# Patient Record
Sex: Male | Born: 1945 | Race: Black or African American | Hispanic: No | Marital: Married | Smoking: Never smoker
Health system: Southern US, Community
[De-identification: ages and names within clinical notes are randomized; demographics above are authoritative.]

## PROBLEM LIST (undated history)

## (undated) DIAGNOSIS — E785 Hyperlipidemia, unspecified: Secondary | ICD-10-CM

## (undated) DIAGNOSIS — J45909 Unspecified asthma, uncomplicated: Secondary | ICD-10-CM

## (undated) DIAGNOSIS — B019 Varicella without complication: Secondary | ICD-10-CM

## (undated) DIAGNOSIS — C61 Malignant neoplasm of prostate: Secondary | ICD-10-CM

## (undated) DIAGNOSIS — T7840XA Allergy, unspecified, initial encounter: Secondary | ICD-10-CM

## (undated) HISTORY — PX: KNEE ARTHROSCOPY: SUR90

## (undated) HISTORY — DX: Hyperlipidemia, unspecified: E78.5

## (undated) HISTORY — PX: WRIST SURGERY: SHX841

## (undated) HISTORY — DX: Varicella without complication: B01.9

## (undated) HISTORY — DX: Unspecified asthma, uncomplicated: J45.909

## (undated) HISTORY — DX: Allergy, unspecified, initial encounter: T78.40XA

---

## 2016-10-08 ENCOUNTER — Ambulatory Visit (INDEPENDENT_AMBULATORY_CARE_PROVIDER_SITE_OTHER): Payer: Self-pay | Admitting: Family Medicine

## 2016-10-08 VITALS — BP 140/70 | HR 63 | Temp 97.9°F | Resp 17 | Ht 72.0 in | Wt 185.0 lb

## 2016-10-08 DIAGNOSIS — I10 Essential (primary) hypertension: Secondary | ICD-10-CM

## 2016-10-08 DIAGNOSIS — J4520 Mild intermittent asthma, uncomplicated: Secondary | ICD-10-CM

## 2016-10-08 DIAGNOSIS — E559 Vitamin D deficiency, unspecified: Secondary | ICD-10-CM

## 2016-10-08 MED ORDER — ALBUTEROL SULFATE HFA 108 (90 BASE) MCG/ACT IN AERS: 2.0000 | INHALATION_SPRAY | Freq: Four times a day (QID) | RESPIRATORY_TRACT | 2 refills | Status: DC | PRN

## 2016-10-08 MED ORDER — LISINOPRIL 10 MG PO TABS: 10.0000 mg | ORAL_TABLET | Freq: Every day | ORAL | 1 refills | Status: DC

## 2016-10-08 NOTE — Progress Notes (Signed)
Chief Complaint  Patient presents with  . Medication Refill    Vit D, Lisinopril, Calcarb 600    HPI  Patient here to establish care  Hypertension: Patient here for follow-up of elevated blood pressure. He is not exercising and is adherent to low salt diet.  Blood pressure is well controlled but does not check at home. Cardiac symptoms none. Patient denies chest pain, lower extremity edema and palpitations.  Cardiovascular risk factors: advanced age (older than 53 for men, 40 for women), hypertension and male gender. Use of agents associated with hypertension: none. History of target organ damage: none.  Asthma Pt reports that he uses advair daily bid and has been compliant He does not know where his albuterol rescue inhaler is He is a nonsmoker Reports that his symptoms are mild and has not had symptoms for more than a month Typical triggers - dust and mold   Past Medical History:  Diagnosis Date  . Asthma     Current Outpatient Prescriptions  Medication Sig Dispense Refill  . Calcium Carbonate-Vitamin D (CALCARB 600/D PO) Take by mouth daily.    . Ergocalciferol (VITAMIN D2 PO) Take 50,000 Units by mouth daily.    . Fluticasone-Salmeterol (ADVAIR) 250-50 MCG/DOSE AEPB Inhale 1 puff into the lungs 2 (two) times daily.    Marland Kitchen lisinopril (PRINIVIL,ZESTRIL) 10 MG tablet Take 1 tablet (10 mg total) by mouth daily. 90 tablet 1  . albuterol (PROVENTIL HFA;VENTOLIN HFA) 108 (90 Base) MCG/ACT inhaler Inhale 2 puffs into the lungs every 6 (six) hours as needed for wheezing or shortness of breath. 1 Inhaler 2   No current facility-administered medications for this visit.     Allergies:  Allergies  Allergen Reactions  . Penicillins Other (See Comments)    Dizziness     History reviewed. No pertinent surgical history.  Social History   Social History  . Marital status: Married    Spouse name: N/A  . Number of children: N/A  . Years of education: N/A   Social History Main  Topics  . Smoking status: Never Smoker  . Smokeless tobacco: Never Used  . Alcohol use 0.6 oz/week    1 Standard drinks or equivalent per week     Comment: per week  . Drug use: No  . Sexual activity: Not Asked   Other Topics Concern  . None   Social History Narrative  . None    ROS  Objective: Vitals:   10/08/16 1137  BP: 140/70  Pulse: 63  Resp: 17  Temp: 97.9 F (36.6 C)  TempSrc: Oral  SpO2: 98%  Weight: 185 lb (83.9 kg)  Height: 6' (1.829 m)    Physical Exam  Constitutional: He is oriented to person, place, and time. He appears well-developed and well-nourished.  HENT:  Head: Normocephalic and atraumatic.  Eyes: Conjunctivae and EOM are normal.  Cardiovascular: Normal rate, regular rhythm and normal heart sounds.   No murmur heard. Pulmonary/Chest: Effort normal and breath sounds normal. No respiratory distress. He has no wheezes.  Neurological: He is alert and oriented to person, place, and time. He has normal reflexes.  Skin: Skin is warm. Capillary refill takes less than 2 seconds.  Psychiatric: He has a normal mood and affect. His behavior is normal. Judgment and thought content normal.    Assessment and Plan Tomer was seen today for medication refill.  Diagnoses and all orders for this visit:  Vitamin D deficiency-  Pt currently taking 50,000 units of Vit D 2  daily Advised to check levels today  If his levels are greater than 20 then he can decrease to weekly -     Vitamin D, 25-hydroxy  Essential hypertension- chronic, well controlled Will assess renal function -     Basic metabolic panel  Mild intermittent asthma, uncomplicated- well controlled on Advair Refilled albuterol to take prn  Other orders -     lisinopril (PRINIVIL,ZESTRIL) 10 MG tablet; Take 1 tablet (10 mg total) by mouth daily. -     albuterol (PROVENTIL HFA;VENTOLIN HFA) 108 (90 Base) MCG/ACT inhaler; Inhale 2 puffs into the lungs every 6 (six) hours as needed for wheezing or  shortness of breath.     [place]

## 2016-10-08 NOTE — Patient Instructions (Addendum)
Please call your insurance to see if you have already had your Welcome to Medicare exam. This is an exam that is done in the first 12 months of being on Medicare. If you have been on Medicare for longer than that then you just need your Medicare Annual Wellness Exam.    IF you received an x-ray today, you will receive an invoice from [facility]. Please contact [facility] at [phone] with questions or concerns regarding your invoice.   IF you received labwork today, you will receive an invoice from [employer]. Please contact [facility] at [phone] with questions or concerns regarding your invoice.   Our billing staff will not be able to assist you with questions regarding bills from these companies.  You will be contacted with the lab results as soon as they are available. The fastest way to get your results is to activate your My Chart account. Instructions are located on the last page of this paperwork. If you have not heard from [place] regarding the results in 2 weeks, please contact this office.

## 2016-10-09 ENCOUNTER — Other Ambulatory Visit: Payer: Self-pay | Admitting: Family Medicine

## 2016-10-09 LAB — BASIC METABOLIC PANEL
BUN: 13 mg/dL (ref 7–25)
CALCIUM: 10.1 mg/dL (ref 8.6–10.3)
CO2: 26 mmol/L (ref 20–31)
CREATININE: 1.1 mg/dL (ref 0.70–1.18)
Chloride: 102 mmol/L (ref 98–110)
GLUCOSE: 82 mg/dL (ref 65–99)
Potassium: 5.2 mmol/L (ref 3.5–5.3)
Sodium: 138 mmol/L (ref 135–146)

## 2016-10-09 LAB — VITAMIN D 25 HYDROXY (VIT D DEFICIENCY, FRACTURES): VIT D 25 HYDROXY: 22 ng/mL — AB (ref 30–100)

## 2016-10-09 MED ORDER — VITAMIN D (ERGOCALCIFEROL) 1.25 MG (50000 UNIT) PO CAPS: 50000.0000 [IU] | ORAL_CAPSULE | ORAL | 1 refills | Status: DC

## 2016-12-05 ENCOUNTER — Telehealth: Payer: Self-pay

## 2016-12-05 ENCOUNTER — Other Ambulatory Visit: Payer: Self-pay | Admitting: Emergency Medicine

## 2016-12-05 ENCOUNTER — Telehealth: Payer: Self-pay | Admitting: Emergency Medicine

## 2016-12-05 MED ORDER — FLUTICASONE-SALMETEROL 250-50 MCG/DOSE IN AEPB: 1.0000 | INHALATION_SPRAY | Freq: Two times a day (BID) | RESPIRATORY_TRACT | 2 refills | Status: DC

## 2016-12-05 NOTE — Telephone Encounter (Signed)
Pt is requesting medication refill Advair Reordered and e-scribed

## 2016-12-05 NOTE — Telephone Encounter (Signed)
Pt is requesting a refill on asthma medication  Please call into [facility] on [address]   Best number [phone]

## 2016-12-20 DIAGNOSIS — H2513 Age-related nuclear cataract, bilateral: Secondary | ICD-10-CM | POA: Diagnosis not present

## 2017-02-10 ENCOUNTER — Ambulatory Visit (INDEPENDENT_AMBULATORY_CARE_PROVIDER_SITE_OTHER): Payer: Medicare HMO | Admitting: Family Medicine

## 2017-02-10 ENCOUNTER — Encounter: Payer: Self-pay | Admitting: Family Medicine

## 2017-02-10 VITALS — BP 145/70 | HR 87 | Resp 12 | Ht 72.0 in | Wt 195.5 lb

## 2017-02-10 DIAGNOSIS — I10 Essential (primary) hypertension: Secondary | ICD-10-CM | POA: Diagnosis not present

## 2017-02-10 DIAGNOSIS — J4520 Mild intermittent asthma, uncomplicated: Secondary | ICD-10-CM

## 2017-02-10 DIAGNOSIS — E559 Vitamin D deficiency, unspecified: Secondary | ICD-10-CM | POA: Insufficient documentation

## 2017-02-10 DIAGNOSIS — R351 Nocturia: Secondary | ICD-10-CM

## 2017-02-10 DIAGNOSIS — E785 Hyperlipidemia, unspecified: Secondary | ICD-10-CM | POA: Diagnosis not present

## 2017-02-10 DIAGNOSIS — N401 Enlarged prostate with lower urinary tract symptoms: Secondary | ICD-10-CM

## 2017-02-10 LAB — URINALYSIS, ROUTINE W REFLEX MICROSCOPIC
Bilirubin Urine: NEGATIVE
Hgb urine dipstick: NEGATIVE
Ketones, ur: NEGATIVE
Leukocytes, UA: NEGATIVE
Nitrite: NEGATIVE
RBC / HPF: NONE SEEN (ref 0–?)
SPECIFIC GRAVITY, URINE: 1.02 (ref 1.000–1.030)
TOTAL PROTEIN, URINE-UPE24: NEGATIVE
URINE GLUCOSE: NEGATIVE
Urobilinogen, UA: 0.2 (ref 0.0–1.0)
pH: 8 (ref 5.0–8.0)

## 2017-02-10 LAB — BASIC METABOLIC PANEL
BUN: 12 mg/dL (ref 6–23)
CHLORIDE: 104 meq/L (ref 96–112)
CO2: 29 mEq/L (ref 19–32)
CREATININE: 1.21 mg/dL (ref 0.40–1.50)
Calcium: 10.4 mg/dL (ref 8.4–10.5)
GFR: 75.96 mL/min (ref >60.00)
GLUCOSE: 83 mg/dL (ref 70–99)
POTASSIUM: 4.2 meq/L (ref 3.5–5.1)
Sodium: 141 mEq/L (ref 135–145)

## 2017-02-10 LAB — VITAMIN D 25 HYDROXY (VIT D DEFICIENCY, FRACTURES): VITD: 36.06 ng/mL (ref 30.00–100.00)

## 2017-02-10 LAB — PSA: PSA: 6.34 ng/mL — AB (ref 0.10–4.00)

## 2017-02-10 MED ORDER — DOXAZOSIN MESYLATE 2 MG PO TABS: 2.0000 mg | ORAL_TABLET | Freq: Every day | ORAL | 3 refills | Status: DC

## 2017-02-10 MED ORDER — VITAMIN D (ERGOCALCIFEROL) 1.25 MG (50000 UNIT) PO CAPS: 50000.0000 [IU] | ORAL_CAPSULE | ORAL | 2 refills | Status: DC

## 2017-02-10 MED ORDER — LISINOPRIL 10 MG PO TABS: 10.0000 mg | ORAL_TABLET | Freq: Every day | ORAL | 1 refills | Status: DC

## 2017-02-10 MED ORDER — FLUTICASONE-SALMETEROL 250-50 MCG/DOSE IN AEPB: 1.0000 | INHALATION_SPRAY | Freq: Two times a day (BID) | RESPIRATORY_TRACT | 2 refills | Status: DC

## 2017-02-10 MED FILL — LISINOPRIL 10 MG TABLET: 10 | 90 days supply | Qty: 90 | Fill #0

## 2017-02-10 NOTE — Patient Instructions (Signed)
A few things to remember from today's visit:   Essential hypertension - Plan: Basic metabolic panel, doxazosin (CARDURA) 2 MG tablet  Vitamin D deficiency - Plan: VITAMIN D 25 Hydroxy (Vit-D Deficiency, Fractures)  BPH associated with nocturia - Plan: doxazosin (CARDURA) 2 MG tablet, PSA, Urinalysis, Routine w reflex microscopic  Blood pressure goal for most people is less than 140/90. Some populations (older than 60) the goal is less than 150/90.  Most recent cardiologists' recommendations recommend blood pressure at or less than 130/80.   Elevated blood pressure increases the risk of strokes, heart and kidney disease, and eye problems. Regular physical activity and a healthy diet (DASH diet) usually help. Low salt diet. Potassium rich diet.  Take medications as instructed.Today Cardura added for prostate and blood pressure. No changes in Lisinopril.  Caution with some over the counter medications as cold medications, dietary products (for weight loss), and Ibuprofen or Aleve (frequent use);all these medications could cause elevation of blood pressure.   Please be sure medication list is accurate. If a new problem present, please set up appointment sooner than planned today.

## 2017-02-10 NOTE — Progress Notes (Signed)
Pre visit review using our clinic review tool, if applicable. No additional management support is needed unless otherwise documented below in the visit note. 

## 2017-02-10 NOTE — Progress Notes (Signed)
HPI:   [name] is a 71 y.o. male, who is here today with his wife to establish care with me.  Former PCP: Dr Willie Parks  Last preventive routine visit: not sure.  Chronic medical problems: HTN,HLD, asthma, and vit D deficiency.  Vit D deficiency: He is on Ergocalciferol 50,000 U weekly.  History of asthma, currently he is on Advair 250-50 mcg, which he uses once daily. He also has albuterol inhaler, which he has not needed it in a while. He denies any nocturnal symptom. Currently he is asymptomatic. Denies Hx of tobacco use, wife is a smoker.  He has not had pneumonia or influenza vaccine, has refused.  Hyperlipidemia:  Currently on non pharmacologic treatment. Following a low fat diet: No.  According to wife, it was checked about 6-7 months ago and was "bad" but he was not started on medication, recommended follow up after dietary changes.  Hypertension:   Dx about a year ago. Currently on Lisinopril 10 mg daily.   He is taking medications as instructed, no side effects reported.  He has not noted unusual headache, visual changes, exertional chest pain, dyspnea,  focal weakness, or edema.   Lab Results  Component Value Date   CREATININE 1.10 10/08/2016   BUN 13 10/08/2016   NA 138 10/08/2016   K 5.2 10/08/2016   CL 102 10/08/2016   CO2 26 10/08/2016     Concerns today: "Prostate" Denies Hx of BPH, wife concerned about him having prostate problems. He denies rectal pain. 4-5 months of urine stream, dribbling, occasional leakage, and nocturia. 2-3 times at night. He denies gross hematuria.   Review of Systems  Constitutional: Negative for activity change, appetite change, fatigue, fever and unexpected weight change.  HENT: Negative for mouth sores, nosebleeds, sore throat and trouble swallowing.   Eyes: Negative for pain, redness and visual disturbance.  Respiratory: Negative for apnea, cough, shortness of breath and wheezing.     Cardiovascular: Negative for chest pain, palpitations and leg swelling.  Gastrointestinal: Negative for abdominal pain, nausea and vomiting.  Endocrine: Negative for polydipsia, polyphagia and polyuria.  Genitourinary: Positive for difficulty urinating and frequency. Negative for decreased urine volume, dysuria, hematuria, scrotal swelling and testicular pain.  Musculoskeletal: Negative for back pain, gait problem and myalgias.  Skin: Negative for rash.  Allergic/Immunologic: Positive for environmental allergies.  Neurological: Negative for dizziness, syncope, weakness and headaches.  Psychiatric/Behavioral: Negative for confusion. The patient is not nervous/anxious.       Current Outpatient Prescriptions on File Prior to Visit  Medication Sig Dispense Refill  . albuterol (PROVENTIL HFA;VENTOLIN HFA) 108 (90 Base) MCG/ACT inhaler Inhale 2 puffs into the lungs every 6 (six) hours as needed for wheezing or shortness of breath. 1 Inhaler 2  . Calcium Carbonate-Vitamin D (CALCARB 600/D PO) Take by mouth daily.     No current facility-administered medications on file prior to visit.      Past Medical History:  Diagnosis Date  . Allergy   . Asthma   . Chicken pox   . Hyperlipidemia    Allergies  Allergen Reactions  . Penicillins Other (See Comments)    Dizziness     Family History  Problem Relation Age of Onset  . Cancer Neg Hx     Social History   Social History  . Marital status: Married    Spouse name: N/A  . Number of children: N/A  . Years of education: N/A   Social History Main  Topics  . Smoking status: Never Smoker  . Smokeless tobacco: Never Used  . Alcohol use 0.6 oz/week    1 Standard drinks or equivalent per week     Comment: per week  . Drug use: No  . Sexual activity: Not Asked   Other Topics Concern  . None   Social History Narrative  . None    Vitals:   02/10/17 1133 02/10/17 1230  BP: (!) 148/80 (!) 145/70  Pulse: 87   Resp: 12   O2  sat 97% at RA.  Body mass index is 26.51 kg/m.   Physical Exam  Nursing note and vitals reviewed. Constitutional: He is oriented to person, place, and time. He appears well-developed and well-nourished. No distress.  HENT:  Head: Atraumatic.  Mouth/Throat: Oropharynx is clear and moist and mucous membranes are normal. He has dentures.  Eyes: Conjunctivae and EOM are normal. Pupils are equal, round, and reactive to light.  Neck: No thyroid mass and no thyromegaly present.  Cardiovascular: Normal rate and regular rhythm.   Murmur (? soft SEM RUSB) heard. Pulses:      Dorsalis pedis pulses are 2+ on the right side, and 2+ on the left side.  Respiratory: Effort normal and breath sounds normal. No respiratory distress.  GI: Soft. He exhibits no mass. There is no hepatomegaly. There is no tenderness.  Genitourinary: Rectal exam shows no internal hemorrhoid and anal tone normal. Prostate is enlarged (R>L lobe). Prostate is not tender.  Genitourinary Comments: Perianal skin tag  Musculoskeletal: He exhibits no edema or tenderness.  Lymphadenopathy:    He has no cervical adenopathy.  Neurological: He is alert and oriented to person, place, and time. He has normal strength. Coordination normal.  Stable gait with no assistance.  Skin: Skin is warm. No erythema.  Psychiatric: He has a normal mood and affect. Cognition and memory are normal.  Well groomed, good eye contact.    ASSESSMENT AND PLAN:   Willie Parks was seen today for establish care.  Diagnoses and all orders for this visit:  Essential hypertension  Mildly elevated. Possible complications of elevated BP discussed. Continue lisinopril 10 mg. Cardura 2 mg added. Recommended monitoring BP at home. Annual eye examination. F/U in 3-4 months.  -     Basic metabolic panel -     doxazosin (CARDURA) 2 MG tablet; Take 1 tablet (2 mg total) by mouth daily. -     lisinopril (PRINIVIL,ZESTRIL) 10 MG tablet; Take 1 tablet (10 mg total)  by mouth daily.  Vitamin D deficiency  Continue ergocalciferol 50,000 units every 2 weeks, will follow labs done today and will give further recommendations accordingly.  -     VITAMIN D 25 Hydroxy (Vit-D Deficiency, Fractures)  BPH associated with nocturia  We discussed treatment options, because his BP is still mildly elevated he agreed with starting Cardura. Some side effects discussed. Further recommendations would be given according to lab results. F/U in 3-4 months.  -     doxazosin (CARDURA) 2 MG tablet; Take 1 tablet (2 mg total) by mouth daily. -     PSA -     Urinalysis, Routine w reflex microscopic  Hyperlipidemia, unspecified hyperlipidemia type  Low fat diet discussed and strongly recommended. We will check lipid panel in 3-4 months.  Mild intermittent asthma, uncomplicated  Weight control on current regimen. He can continue Advil once daily. Albuterol inhaler as needed. Strongly recommend pneumonia and influenza vaccination, he will let me know if he decides to  do so. Follow-up in 12 months.  -     Fluticasone-Salmeterol (ADVAIR) 250-50 MCG/DOSE AEPB; Inhale 1 puff into the lungs 2 (two) times daily.      Emelee Rodocker G. Martinique, MD  [facility]. [place] office.

## 2017-02-11 ENCOUNTER — Other Ambulatory Visit: Payer: Self-pay | Admitting: Family Medicine

## 2017-02-11 DIAGNOSIS — R972 Elevated prostate specific antigen [PSA]: Secondary | ICD-10-CM

## 2017-02-18 MED FILL — DOXAZOSIN MESYLATE 4 MG TAB: 4 | 30 days supply | Qty: 15 | Fill #0

## 2017-02-18 MED FILL — ADVAIR 250/50 DISKUS: 250-50 | 30 days supply | Qty: 60 | Fill #0

## 2017-02-19 ENCOUNTER — Telehealth: Payer: Self-pay | Admitting: Family Medicine

## 2017-02-19 NOTE — Telephone Encounter (Signed)
Wife called to ask if pt could try another med besides the lisinopril (PRINIVIL,ZESTRIL) 10 MG tablet  Pt has experienced cough, itching, and thinks now he had taken this in the past, but forgot.   [place] out [employer]

## 2017-02-20 MED ORDER — LOSARTAN POTASSIUM 25 MG PO TABS: 25.0000 mg | ORAL_TABLET | Freq: Every day | ORAL | 1 refills | Status: DC

## 2017-02-20 NOTE — Telephone Encounter (Signed)
He did not mention cough during his last OV. Also he has Hx of allergies and asthma. If she strongly believes his cough is related with Lisinopril, he can change to Losartan 25 mg daily. 4 weeks follow up for HTN.  Thanks, BJ

## 2017-02-20 NOTE — Telephone Encounter (Signed)
Called and spoke with wife. Advised that we could send in Losartan for patient to try and she is agreeable. Rx sent to pharmacy & advised that he needs a follow up in 4 weeks to see how his BP is doing.

## 2017-03-20 MED FILL — DOXAZOSIN MESYLATE 4 MG TAB: 4 | 30 days supply | Qty: 15 | Fill #1

## 2017-04-22 MED FILL — DOXAZOSIN MESYLATE 4 MG TAB: 4 | 30 days supply | Qty: 15 | Fill #2

## 2017-05-02 MED FILL — ADVAIR 250/50 DISKUS: 250-50 | 30 days supply | Qty: 60 | Fill #1

## 2017-05-14 DIAGNOSIS — R972 Elevated prostate specific antigen [PSA]: Secondary | ICD-10-CM | POA: Diagnosis not present

## 2017-05-14 DIAGNOSIS — N401 Enlarged prostate with lower urinary tract symptoms: Secondary | ICD-10-CM | POA: Diagnosis not present

## 2017-05-14 DIAGNOSIS — R3912 Poor urinary stream: Secondary | ICD-10-CM | POA: Diagnosis not present

## 2017-05-22 DIAGNOSIS — R972 Elevated prostate specific antigen [PSA]: Secondary | ICD-10-CM | POA: Diagnosis not present

## 2017-05-22 MED FILL — DOXAZOSIN MESYLATE 4 MG TAB: 4 | 30 days supply | Qty: 15 | Fill #3

## 2017-06-13 ENCOUNTER — Ambulatory Visit: Payer: Medicare HMO | Admitting: Family Medicine

## 2017-06-17 NOTE — Progress Notes (Signed)
HPI:  Willie. Willie Parks is a 71 y.o.male here today with his wife for his routine physical examination and f/u on some medical problems.   He lives with his wife. Independent ADL's and IADL's.  Denies falls in the past year and denies depression symptoms.  Hypertension:   Currently on Cardura 1 mg daily (2 mg tab 1/2 tab), this medication was recommended last OV , 2 mg, to help with HTN and nocturia. He is not taking Lisinopril or Cozaar.  Wife called a few days after last OV reporting that Willie Parks was having cough,which she thought was caused by Lisinopril. So it was changed to Cozaar.She tells me that she did not call and she does not remember him having side effects with medications.He is not sure why he is on Cardura 1 mg instead 2 mg. I added last OV because urinart frequency and nocturia.   He has not noted side effects, requesting refills.  He has not noted unusual headache, visual changes, exertional chest pain, dyspnea,  focal weakness, or edema.   Lab Results  Component Value Date   CREATININE 1.21 02/10/2017   BUN 12 02/10/2017   NA 141 02/10/2017   K 4.2 02/10/2017   CL 104 02/10/2017   CO2 29 02/10/2017   He is not checking BP at home. He is following low salt diet.  HLD:  He is on non pharmacologic treatment.  Asthma:  He is currently on Advair 250-50 mcg bid. He denies cough,wheezing,or dyspnea. He needs a refills.  Providers he sees regularly:  He was referred to urologists last OV, elevated PSA. He saw Dr Jeffie Pollock on 05/14/2017. According to pt, Dr Jeffie Pollock is waiting for records from former PCP. 6 months f/u was recommended.  He had eye exam this year, does not remember name of eye care provider.   Functional Status Survey: Is the patient deaf or have difficulty hearing?: No Does the patient have difficulty seeing, even when wearing glasses/contacts?: No Does the patient have difficulty concentrating, remembering, or making decisions?:  No Does the patient have difficulty walking or climbing stairs?: No Does the patient have difficulty dressing or bathing?: No Does the patient have difficulty doing errands alone such as visiting a doctor's office or shopping?: No    Depression screen PHQ 2/9 06/18/2017  Decreased Interest 0  Down, Depressed, Hopeless 0  PHQ - 2 Score 0       Mini-Cog - 06/18/17 0724    Normal clock drawing test? no   How many words correct? 3       Review of Systems  Constitutional: Negative for activity change, appetite change, fatigue, fever and unexpected weight change.  HENT: Negative for hearing loss, nosebleeds, sore throat and trouble swallowing.   Eyes: Negative for redness and visual disturbance.  Respiratory: Negative for cough, shortness of breath and wheezing.   Cardiovascular: Negative for chest pain, palpitations and leg swelling.  Gastrointestinal: Negative for abdominal pain, blood in stool, nausea and vomiting.  Endocrine: Negative for cold intolerance, heat intolerance, polydipsia, polyphagia and polyuria.  Genitourinary: Negative for decreased urine volume, dysuria and hematuria.  Musculoskeletal: Negative for gait problem and myalgias.  Skin: Negative for rash.  Neurological: Negative for syncope, weakness and headaches.  Psychiatric/Behavioral: Negative for confusion and sleep disturbance. The patient is not nervous/anxious.      Current Outpatient Prescriptions on File Prior to Visit  Medication Sig Dispense Refill  . Calcium Carbonate-Vitamin D (CALCARB 600/D PO) Take by mouth  daily.     No current facility-administered medications on file prior to visit.      Past Medical History:  Diagnosis Date  . Allergy   . Asthma   . Chicken pox   . Hyperlipidemia     Allergies  Allergen Reactions  . Penicillins Other (See Comments)    Dizziness    Past Surgical History:  Procedure Laterality Date  . KNEE ARTHROSCOPY Right   . WRIST SURGERY Right     Family  History  Problem Relation Age of Onset  . Cancer Neg Hx   . Diabetes Neg Hx     Social History   Social History  . Marital status: Married    Spouse name: N/A  . Number of children: N/A  . Years of education: N/A   Social History Main Topics  . Smoking status: Never Smoker  . Smokeless tobacco: Never Used  . Alcohol use 0.6 oz/week    1 Standard drinks or equivalent per week     Comment: per week  . Drug use: No  . Sexual activity: Not Currently   Other Topics Concern  . None   Social History Narrative  . None     Vitals:   06/18/17 0654 06/18/17 0748  BP: 130/78 120/75  Pulse: 83   Resp: 12    Body mass index is 26.07 kg/m.  O2 sat at RA 97%  Wt Readings from Last 3 Encounters:  06/18/17 192 lb 4 oz (87.2 kg)  02/10/17 195 lb 8 oz (88.7 kg)  10/08/16 185 lb (83.9 kg)    Physical Exam  Nursing note and vitals reviewed. Constitutional: He is oriented to person, place, and time. He appears well-developed and well-nourished. No distress.  HENT:  Head: Atraumatic.  Mouth/Throat: Oropharynx is clear and moist and mucous membranes are normal.  Eyes: Conjunctivae and EOM are normal. Pupils are equal, round, and reactive to light.  Neck: No JVD present. No tracheal deviation present. No thyroid mass and no thyromegaly present.  Cardiovascular: Normal rate.  An irregular rhythm present.  No murmur heard. Pulses:      Dorsalis pedis pulses are 2+ on the right side, and 2+ on the left side.  Respiratory: Effort normal and breath sounds normal. No respiratory distress.  GI: Soft. He exhibits no mass. There is no hepatomegaly. There is no tenderness.  Musculoskeletal: He exhibits no edema or tenderness.  Lymphadenopathy:    He has no cervical adenopathy.  Neurological: He is alert and oriented to person, place, and time. He has normal strength. No cranial nerve deficit. Gait normal.  Skin: Skin is warm. No erythema.  Psychiatric: He has a normal mood and affect.  Cognition and memory are normal.  Well groomed, good eye contact.     ASSESSMENT AND PLAN:   Discussed the following assessment and plan:   Willie Parks was seen today for follow-up and annual exam.  Diagnoses and all orders for this visit:  Lab Results  Component Value Date   CREATININE 1.29 06/18/2017   BUN 10 06/18/2017   NA 137 06/18/2017   K 4.6 06/18/2017   CL 101 06/18/2017   CO2 29 06/18/2017   Lab Results  Component Value Date   CHOL 215 (H) 06/18/2017   HDL 52.50 06/18/2017   LDLDIRECT 115.0 06/18/2017   TRIG 222.0 (H) 06/18/2017   CHOLHDL 4 06/18/2017   Lab Results  Component Value Date   TSH 1.52 06/18/2017    Routine general medical  examination at a health care facility   We discussed the importance of regular physical activity and healthy diet for prevention of chronic illness and/or complications. Preventive guidelines reviewed. Vaccination not sure if up to date but he is not interested in Shorter 13. Ca++ and vit D supplementation to continue. Never smoker. Colonoscopy: per pt and wife done within the past 5 years, not sure about date.We are still waiting for records from former PCP [facility]  Advance Directives: He does not have a POA or living will. Strongly recommended, educated about importance of end of life planning. He is not interested in handout information. Fall prevention discussed: Get up and go test < 30 seconds. HCV screening: Refused. According to wife, it has been done a couple years ago.   Next CPE in 1 year.   Hyperlipidemia, unspecified hyperlipidemia type  No changes in current management, non pharmacologic. We will follow labs done today and will give further recommendations accordingly. F/U in 6-12 months.   -     Lipid panel -     LDL cholesterol, direct   Irregular heart rate  No prior Hx reported. Asymptomatic. EKG done today: SR, unsp T wave abn lateral leads,LAE, PVC's. Normal axis and intervals. No other  EKG for comparison. We discussed possible etiologies, including CAD. He and his wife are not interested in cardiology evaluation. Clearly instructed about warning signs. Echo ordered. F/U in 6 months, before if needed.   -     EKG 12-Lead -     Basic metabolic panel -     TSH -     ECHOCARDIOGRAM COMPLETE; Future  Essential hypertension  Adequately controlled. No changes in current management. DASH-low salt diet to continue. Eye exam recommended annually. F/U in 6 months, before if needed.  -     ECHOCARDIOGRAM COMPLETE; Future -     doxazosin (CARDURA) 2 MG tablet; Take 0.5 tablets (1 mg total) by mouth daily.  Mild intermittent asthma, uncomplicated  Well controlled. No changes in current management. F/U in 12 months.  -     Fluticasone-Salmeterol (ADVAIR) 250-50 MCG/DOSE AEPB; Inhale 1 puff into the lungs 2 (two) times daily.  BPH associated with nocturia  Cardura helping with urinary frequency. He will continue following with urologists, Dr Jeffie Pollock.  -     doxazosin (CARDURA) 2 MG tablet; Take 0.5 tablets (1 mg total) by mouth daily.    Return in about 6 months (around 12/18/2017) for HTN.      Wiletta Bermingham G. Martinique, MD  [facility]. [place] office.

## 2017-06-18 ENCOUNTER — Encounter: Payer: Self-pay | Admitting: Family Medicine

## 2017-06-18 ENCOUNTER — Ambulatory Visit (INDEPENDENT_AMBULATORY_CARE_PROVIDER_SITE_OTHER): Payer: Medicare HMO | Admitting: Family Medicine

## 2017-06-18 VITALS — BP 120/75 | HR 83 | Resp 12 | Ht 72.0 in | Wt 192.2 lb

## 2017-06-18 DIAGNOSIS — J4520 Mild intermittent asthma, uncomplicated: Secondary | ICD-10-CM | POA: Diagnosis not present

## 2017-06-18 DIAGNOSIS — I10 Essential (primary) hypertension: Secondary | ICD-10-CM

## 2017-06-18 DIAGNOSIS — Z0001 Encounter for general adult medical examination with abnormal findings: Secondary | ICD-10-CM | POA: Diagnosis not present

## 2017-06-18 DIAGNOSIS — E785 Hyperlipidemia, unspecified: Secondary | ICD-10-CM | POA: Diagnosis not present

## 2017-06-18 DIAGNOSIS — I499 Cardiac arrhythmia, unspecified: Secondary | ICD-10-CM | POA: Diagnosis not present

## 2017-06-18 DIAGNOSIS — N401 Enlarged prostate with lower urinary tract symptoms: Secondary | ICD-10-CM | POA: Diagnosis not present

## 2017-06-18 DIAGNOSIS — Z0000 Encounter for general adult medical examination without abnormal findings: Secondary | ICD-10-CM

## 2017-06-18 DIAGNOSIS — R351 Nocturia: Secondary | ICD-10-CM

## 2017-06-18 LAB — LIPID PANEL
CHOL/HDL RATIO: 4
Cholesterol: 215 mg/dL — ABNORMAL HIGH (ref 0–200)
HDL: 52.5 mg/dL (ref >39.00)
NONHDL: 162.01
Triglycerides: 222 mg/dL — ABNORMAL HIGH (ref 0.0–149.0)
VLDL: 44.4 mg/dL — ABNORMAL HIGH (ref 0.0–40.0)

## 2017-06-18 LAB — BASIC METABOLIC PANEL
BUN: 10 mg/dL (ref 6–23)
CHLORIDE: 101 meq/L (ref 96–112)
CO2: 29 meq/L (ref 19–32)
Calcium: 10.1 mg/dL (ref 8.4–10.5)
Creatinine, Ser: 1.29 mg/dL (ref 0.40–1.50)
GFR: 70.48 mL/min (ref >60.00)
GLUCOSE: 101 mg/dL — AB (ref 70–99)
POTASSIUM: 4.6 meq/L (ref 3.5–5.1)
Sodium: 137 mEq/L (ref 135–145)

## 2017-06-18 LAB — LDL CHOLESTEROL, DIRECT: Direct LDL: 115 mg/dL

## 2017-06-18 LAB — TSH: TSH: 1.52 u[IU]/mL (ref 0.35–4.50)

## 2017-06-18 MED ORDER — FLUTICASONE-SALMETEROL 250-50 MCG/DOSE IN AEPB: 1.0000 | INHALATION_SPRAY | Freq: Two times a day (BID) | RESPIRATORY_TRACT | 11 refills | Status: DC

## 2017-06-18 MED ORDER — DOXAZOSIN MESYLATE 2 MG PO TABS: 1.0000 mg | ORAL_TABLET | Freq: Every day | ORAL | 3 refills | Status: DC

## 2017-06-18 NOTE — Patient Instructions (Addendum)
A few things to remember from today's visit:   Hyperlipidemia, unspecified hyperlipidemia type - Plan: Lipid panel  Encounter for HCV screening test for high risk patient  Irregular heart rate - Plan: EKG 91-TAVW, Basic metabolic panel, TSH, ECHOCARDIOGRAM COMPLETE  Essential hypertension - Plan: ECHOCARDIOGRAM COMPLETE, doxazosin (CARDURA) 2 MG tablet  Mild intermittent asthma, uncomplicated - Plan: Fluticasone-Salmeterol (ADVAIR) 250-50 MCG/DOSE AEPB  BPH associated with nocturia - Plan: doxazosin (CARDURA) 2 MG tablet  A few tips:  -As we age balance is not as good as it was, so there is a higher risks for falls. Please remove small rugs and furniture that is "in your way" and could increase the risk of falls. Stretching exercises may help with fall prevention: Yoga and Tai Chi are some examples. Low impact exercise is better, so you are not very achy the next day.  -Sun screen and avoidance of direct sun light recommended. Caution with dehydration, if working outdoors be sure to drink enough fluids.  - Some medications are not safe as we age, increases the risk of side effects and can potentially interact with other medication you are also taken;  including some of over the counter medications. Be sure to let me know when you start a new medication even if it is a dietary/vitamin supplement.   -Healthy diet low in red meet/animal fat and sugar + regular physical activity is recommended.    Please bring medications next visit.   Please be sure medication list is accurate. If a new problem present, please set up appointment sooner than planned today.

## 2017-06-21 ENCOUNTER — Encounter: Payer: Self-pay | Admitting: Family Medicine

## 2017-06-23 MED FILL — DOXAZOSIN MESYLATE TAB 1 MG: 1 | 60 days supply | Qty: 60 | Fill #0

## 2017-06-30 ENCOUNTER — Telehealth: Payer: Self-pay | Admitting: Family Medicine

## 2017-06-30 ENCOUNTER — Other Ambulatory Visit: Payer: Self-pay

## 2017-06-30 MED ORDER — ATORVASTATIN CALCIUM 10 MG PO TABS: 10.0000 mg | ORAL_TABLET | Freq: Every day | ORAL | 2 refills | Status: DC

## 2017-06-30 MED FILL — ATORVASTATIN 10 MG TABLET: 10 | 90 days supply | Qty: 90 | Fill #0

## 2017-06-30 NOTE — Telephone Encounter (Signed)
Pt states ALL his meds should go to  [place], [place]  Please take [facility] off med list.  Pt would like you to resend  atorvastatin (LIPITOR) 10 MG tablet  To [facility]

## 2017-06-30 NOTE — Telephone Encounter (Signed)
Rx sent 

## 2017-07-03 ENCOUNTER — Other Ambulatory Visit: Payer: Self-pay

## 2017-07-03 ENCOUNTER — Ambulatory Visit (HOSPITAL_COMMUNITY): Payer: Medicare HMO | Attending: Cardiology

## 2017-07-03 DIAGNOSIS — I499 Cardiac arrhythmia, unspecified: Secondary | ICD-10-CM | POA: Diagnosis not present

## 2017-07-03 DIAGNOSIS — I10 Essential (primary) hypertension: Secondary | ICD-10-CM

## 2017-07-03 DIAGNOSIS — E785 Hyperlipidemia, unspecified: Secondary | ICD-10-CM | POA: Diagnosis not present

## 2017-07-04 ENCOUNTER — Other Ambulatory Visit: Payer: Medicare HMO

## 2017-07-04 MED FILL — ADVAIR 250/50 DISKUS: 250-50 | 30 days supply | Qty: 60 | Fill #2

## 2017-07-07 ENCOUNTER — Other Ambulatory Visit: Payer: Self-pay | Admitting: Urology

## 2017-07-07 DIAGNOSIS — R972 Elevated prostate specific antigen [PSA]: Secondary | ICD-10-CM

## 2017-07-07 MED FILL — diazePAM 10 MG TABS: 10 | 1 days supply | Qty: 2 | Fill #0

## 2017-08-04 ENCOUNTER — Ambulatory Visit (HOSPITAL_COMMUNITY): Payer: Medicare HMO

## 2017-08-08 ENCOUNTER — Ambulatory Visit (HOSPITAL_COMMUNITY)
Admission: RE | Admit: 2017-08-08 | Discharge: 2017-08-08 | Disposition: A | Discharge status: Home / Self Care | Payer: Medicare HMO | Source: Ambulatory Visit | Attending: Urology | Admitting: Urology

## 2017-08-08 DIAGNOSIS — N4232 Atypical small acinar proliferation of prostate: Secondary | ICD-10-CM | POA: Diagnosis not present

## 2017-08-08 DIAGNOSIS — N329 Bladder disorder, unspecified: Secondary | ICD-10-CM | POA: Diagnosis not present

## 2017-08-08 DIAGNOSIS — R972 Elevated prostate specific antigen [PSA]: Secondary | ICD-10-CM | POA: Diagnosis not present

## 2017-08-08 LAB — POCT I-STAT CREATININE: Creatinine, Ser: 1.2 mg/dL (ref 0.61–1.24)

## 2017-08-08 MED ORDER — LIDOCAINE HCL 2 % EX GEL
CUTANEOUS | Status: AC
  Filled 2017-08-08: qty 30

## 2017-08-08 MED ORDER — GADOBENATE DIMEGLUMINE 529 MG/ML IV SOLN
20.0000 mL | Freq: Once | INTRAVENOUS | Status: AC | PRN
  Administered 2017-08-08: 18 mL via INTRAVENOUS

## 2017-08-08 MED ORDER — LIDOCAINE HCL 2 % EX GEL: 1.0000 "application " | Freq: Once | CUTANEOUS | Status: DC

## 2017-08-22 MED FILL — DOXAZOSIN MESYLATE TAB 1 MG: 1 | 60 days supply | Qty: 60 | Fill #1

## 2017-09-03 MED FILL — ADVAIR 250/50 DISKUS: 250-50 | 30 days supply | Qty: 60 | Fill #0

## 2017-10-22 MED FILL — DOXAZOSIN MESYLATE TAB 1 MG: 1 | 60 days supply | Qty: 60 | Fill #2

## 2017-11-05 MED FILL — ADVAIR 250/50 DISKUS: 250-50 | 30 days supply | Qty: 60 | Fill #1

## 2017-12-23 NOTE — Progress Notes (Deleted)
HPI:   Willie Parks is a 72 y.o. male, who is here today for 6 months follow-up  No new problems since his last OV on 06/18/17.  HTN: Chronic problem. On Lisinopril 10 mg daily. Last eye exam: *** Denies severe/frequent headache, visual changes, chest pain, dyspnea, palpitation, claudication, focal weakness, or edema.   Lab Results  Component Value Date   CREATININE 1.20 08/08/2017   BUN 10 06/18/2017   NA 137 06/18/2017   K 4.6 06/18/2017   CL 101 06/18/2017   CO2 29 06/18/2017    BPH with nocturia: He is on Doxazosin 2 mg at bedtime. Nocturia x ***  Denies dysuria,increased urinary frequency, gross hematuria,or decreased urine output.  Vit D deficiency: Currently on Ergocalciferol 50,000 U q 2 weeks. *** *** Last 25 OH vit D on 02/10/17 was 36.06.    Asthma: He is on Advair 250-50 mcg bid. He uses Albuterol inh about *** per week.  Nocturnal symptoms: ***   Hyperlipidemia:  Currently on Atorvastatin 10 mg added about 6 months ago. Following a low fat diet: ***.  *** has not noted side effects with medication.  Lab Results  Component Value Date   CHOL 215 (H) 06/18/2017   HDL 52.50 06/18/2017   LDLDIRECT 115.0 06/18/2017   TRIG 222.0 (H) 06/18/2017   CHOLHDL 4 06/18/2017     Concerns today: ***     Review of Systems    Current Outpatient Medications on File Prior to Visit  Medication Sig Dispense Refill  . atorvastatin (LIPITOR) 10 MG tablet Take 1 tablet (10 mg total) by mouth daily with supper. 90 tablet 2  . Calcium Carbonate-Vitamin D (CALCARB 600/D PO) Take by mouth daily.    Marland Kitchen doxazosin (CARDURA) 2 MG tablet Take 0.5 tablets (1 mg total) by mouth daily. 30 tablet 3  . Fluticasone-Salmeterol (ADVAIR) 250-50 MCG/DOSE AEPB Inhale 1 puff into the lungs 2 (two) times daily. 60 each 11   No current facility-administered medications on file prior to visit.      Past Medical History:  Diagnosis Date  . Allergy   . Asthma     . Chicken pox   . Hyperlipidemia    Allergies  Allergen Reactions  . Penicillins Other (See Comments)    Dizziness     Social History   Socioeconomic History  . Marital status: Married    Spouse name: Not on file  . Number of children: Not on file  . Years of education: Not on file  . Highest education level: Not on file  Social Needs  . Financial resource strain: Not on file  . Food insecurity - worry: Not on file  . Food insecurity - inability: Not on file  . Transportation needs - medical: Not on file  . Transportation needs - non-medical: Not on file  Occupational History  . Not on file  Tobacco Use  . Smoking status: Never Smoker  . Smokeless tobacco: Never Used  Substance and Sexual Activity  . Alcohol use: Yes    Alcohol/week: 0.6 oz    Types: 1 Standard drinks or equivalent per week    Comment: per week  . Drug use: No  . Sexual activity: Not Currently  Other Topics Concern  . Not on file  Social History Narrative  . Not on file    There were no vitals filed for this visit. There is no height or weight on file to calculate BMI.  Physical Exam    ASSESSMENT AND PLAN:     Diagnoses and all orders for this visit:  Essential hypertension           -[name] was advised to return sooner than planned today if new concerns arise.       Kalan Yeley G. Martinique, MD  [facility]. [place] office.

## 2017-12-24 ENCOUNTER — Ambulatory Visit: Payer: Medicare HMO | Admitting: Family Medicine

## 2017-12-24 DIAGNOSIS — Z0289 Encounter for other administrative examinations: Secondary | ICD-10-CM

## 2017-12-25 MED FILL — DOXAZOSIN MESYLATE TAB 1 MG: 1 | 60 days supply | Qty: 60 | Fill #3

## 2018-01-08 DIAGNOSIS — H524 Presbyopia: Secondary | ICD-10-CM | POA: Diagnosis not present

## 2018-01-14 ENCOUNTER — Encounter: Payer: Medicare HMO | Admitting: Family Medicine

## 2018-01-20 DIAGNOSIS — R972 Elevated prostate specific antigen [PSA]: Secondary | ICD-10-CM | POA: Diagnosis not present

## 2018-01-21 ENCOUNTER — Encounter: Payer: Medicare HMO | Admitting: Family Medicine

## 2018-01-26 DIAGNOSIS — N401 Enlarged prostate with lower urinary tract symptoms: Secondary | ICD-10-CM | POA: Diagnosis not present

## 2018-01-26 DIAGNOSIS — N4232 Atypical small acinar proliferation of prostate: Secondary | ICD-10-CM | POA: Diagnosis not present

## 2018-01-26 DIAGNOSIS — R3912 Poor urinary stream: Secondary | ICD-10-CM | POA: Diagnosis not present

## 2018-01-26 DIAGNOSIS — R972 Elevated prostate specific antigen [PSA]: Secondary | ICD-10-CM | POA: Diagnosis not present

## 2018-01-26 MED FILL — levoFLOXacin 750 MG TABS: 750 | 1 days supply | Qty: 1 | Fill #0

## 2018-01-26 MED FILL — diazePAM 10 MG TABS: 10 | 1 days supply | Qty: 2 | Fill #0

## 2018-01-30 ENCOUNTER — Ambulatory Visit (INDEPENDENT_AMBULATORY_CARE_PROVIDER_SITE_OTHER): Payer: Medicare HMO

## 2018-01-30 DIAGNOSIS — Z23 Encounter for immunization: Secondary | ICD-10-CM | POA: Diagnosis not present

## 2018-02-12 DIAGNOSIS — Z0100 Encounter for examination of eyes and vision without abnormal findings: Secondary | ICD-10-CM | POA: Diagnosis not present

## 2018-02-26 ENCOUNTER — Other Ambulatory Visit: Payer: Self-pay | Admitting: Family Medicine

## 2018-02-26 MED FILL — ATORVASTATIN 10 MG TABLET: 10 | 90 days supply | Qty: 90 | Fill #1

## 2018-02-27 NOTE — Telephone Encounter (Signed)
Cardura refill request  LOV 06/18/17 with Dr. Martinique.  Cancelled appts 07/04/17;   08/04/17;   01/14/18;   01/21/18  [place], [place] [employer].

## 2018-02-27 NOTE — Telephone Encounter (Signed)
Copied from [place]. Topic: Quick Communication - Rx Refill/Question >> Feb 27, 2018  9:17 AM Aurelio Brash B wrote: Medication: doxazosin (CARDURA) 2 MG tablet   Has the patient contacted their pharmacy?    (Agent: If no, request that the patient contact the pharmacy for the refill.)   Preferred Pharmacy (with phone number or street name): [place], [place] - [address] [phone] (Phone) [phone] (Fax)     Agent: Please be advised that RX refills may take up to 3 business days. We ask that you follow-up with your pharmacy.

## 2018-03-01 NOTE — Progress Notes (Signed)
HPI:   [name] is a 71 y.o. male, who is here today for follow up.   He was last seen in 05/2017.  Since his last OV he has followed with urologist, he has an appt for prostate Bx in a few weeks.  Hypertension:    Currently on Doxazosin 1 mg daily, which was also added to help with BPH with nocturia.   Nocturia improved, it went from 3 to 1 per night.  He is taking medications as instructed, no side effects reported.  He has not noted unusual headache, visual changes, exertional chest pain, dyspnea,  focal weakness, or edema.   Lab Results  Component Value Date   CREATININE 1.20 08/08/2017   BUN 10 06/18/2017   NA 137 06/18/2017   K 4.6 06/18/2017   CL 101 06/18/2017   CO2 29 06/18/2017    Hyperlipidemia:  He is not taking Lipitor 10 mg, denies side effects when he took med. Following a low fat diet: No.    Lab Results  Component Value Date   CHOL 215 (H) 06/18/2017   HDL 52.50 06/18/2017   LDLDIRECT 115.0 06/18/2017   TRIG 222.0 (H) 06/18/2017   CHOLHDL 4 06/18/2017     Asthma: Using Advair prn for dyspnea and wheezing. He has not had symptoms in months.  He denies cough,dyspnea,wheezing.  Vit D deficiency, he is taking Ca++ with D 600 mg -400 U daily.   Review of Systems  Constitutional: Negative for activity change, appetite change, fatigue and fever.  HENT: Negative for nosebleeds, sore throat and trouble swallowing.   Eyes: Negative for redness and visual disturbance.  Respiratory: Negative for cough, shortness of breath and wheezing.   Cardiovascular: Negative for chest pain, palpitations and leg swelling.  Gastrointestinal: Negative for abdominal pain, nausea and vomiting.       No changes in bowel habits.  Endocrine: Negative for cold intolerance and heat intolerance.  Genitourinary: Negative for decreased urine volume, dysuria and hematuria.  Musculoskeletal: Negative for gait problem and myalgias.  Allergic/Immunologic:  Positive for environmental allergies.  Neurological: Negative for dizziness, syncope, weakness and headaches.      Current Outpatient Medications on File Prior to Visit  Medication Sig Dispense Refill  . Calcium Carbonate-Vitamin D (CALCARB 600/D PO) Take by mouth daily.    . diazepam (VALIUM) 10 MG tablet   0  . Fluticasone-Salmeterol (ADVAIR) 250-50 MCG/DOSE AEPB Inhale 1 puff into the lungs 2 (two) times daily. 60 each 11   No current facility-administered medications on file prior to visit.      Past Medical History:  Diagnosis Date  . Allergy   . Asthma   . Chicken pox   . Hyperlipidemia    Allergies  Allergen Reactions  . Penicillins Other (See Comments)    Dizziness     Social History   Socioeconomic History  . Marital status: Married    Spouse name: None  . Number of children: None  . Years of education: None  . Highest education level: None  Social Needs  . Financial resource strain: None  . Food insecurity - worry: None  . Food insecurity - inability: None  . Transportation needs - medical: None  . Transportation needs - non-medical: None  Occupational History  . None  Tobacco Use  . Smoking status: Never Smoker  . Smokeless tobacco: Never Used  Substance and Sexual Activity  . Alcohol use: Yes    Alcohol/week: 0.6 oz  Types: 1 Standard drinks or equivalent per week    Comment: per week  . Drug use: No  . Sexual activity: Not Currently  Other Topics Concern  . None  Social History Narrative  . None    Vitals:   03/02/18 0856  BP: 124/82  Pulse: 61  Resp: 12  Temp: 97.8 F (36.6 C)  SpO2: 98%   Body mass index is 26.24 kg/m.   Physical Exam  Nursing note and vitals reviewed. Constitutional: He is oriented to person, place, and time. He appears well-developed and well-nourished. No distress.  HENT:  Head: Normocephalic and atraumatic.  Mouth/Throat: Oropharynx is clear and moist and mucous membranes are normal.  Eyes:  Conjunctivae are normal. Pupils are equal, round, and reactive to light.  Cardiovascular: Normal rate and regular rhythm.  No murmur heard. Pulses:      Dorsalis pedis pulses are 2+ on the right side, and 2+ on the left side.  Respiratory: Effort normal and breath sounds normal. No respiratory distress.  GI: Soft. He exhibits no mass. There is no hepatomegaly. There is no tenderness.  Musculoskeletal: He exhibits no edema or tenderness.  Lymphadenopathy:    He has no cervical adenopathy.  Neurological: He is alert and oriented to person, place, and time. He has normal strength.  Skin: Skin is warm. No erythema.  Psychiatric: He has a normal mood and affect. Cognition and memory are normal.  Well groomed, good eye contact.      ASSESSMENT AND PLAN:   [name] was seen today for follow-up.  Orders Placed This Encounter  Procedures  . Basic metabolic panel  . Lipid panel  . VITAMIN D 25 Hydroxy (Vit-D Deficiency, Fractures)   Lab Results  Component Value Date   CHOL 192 03/02/2018   HDL 50.40 03/02/2018   LDLCALC 104 (H) 03/02/2018   LDLDIRECT 115.0 06/18/2017   TRIG 191.0 (H) 03/02/2018   CHOLHDL 4 03/02/2018   Lab Results  Component Value Date   CREATININE 1.23 03/02/2018   BUN 8 03/02/2018   NA 138 03/02/2018   K 4.8 03/02/2018   CL 102 03/02/2018   CO2 29 03/02/2018   The 10-year ASCVD risk score Willie Parks DC Jr., et al., 2013) is: 18.9%   Values used to calculate the score:     Age: 52 years     Sex: Male     Is Non-Hispanic African American: Yes     Diabetic: No     Tobacco smoker: No     Systolic Blood Pressure: 938 mmHg     Is BP treated: Yes     HDL Cholesterol: 50.4 mg/dL     Total Cholesterol: 192 mg/dL   Vitamin D deficiency No changes in current management, will follow labs done today and will give further recommendations accordingly. F/U in 6-12 months.  Mild intermittent asthma, uncomplicated Well controlled. Recommend considering  daily Advair during Spring and Fall. Instructed about warning signs. F/U annually.  BPH associated with nocturia Cardura helping with nocturia. No changes today,  He also will continue following with urologist, pending prostate Bx in 03/2018.  Essential hypertension Adequately controlled basically with non pharmacologic treatment. Low salt diet recommended. Eye exam recommended annually. F/U in 12 months, before if needed.             -[name] was advised to return sooner than planned today if new concerns arise.       Betty G. Martinique, MD  [facility]. [place] office.

## 2018-03-02 ENCOUNTER — Encounter: Payer: Self-pay | Admitting: Family Medicine

## 2018-03-02 ENCOUNTER — Ambulatory Visit (INDEPENDENT_AMBULATORY_CARE_PROVIDER_SITE_OTHER): Payer: Medicare HMO | Admitting: Family Medicine

## 2018-03-02 VITALS — BP 124/82 | HR 61 | Temp 97.8°F | Resp 12 | Ht 72.0 in | Wt 193.5 lb

## 2018-03-02 DIAGNOSIS — N401 Enlarged prostate with lower urinary tract symptoms: Secondary | ICD-10-CM | POA: Diagnosis not present

## 2018-03-02 DIAGNOSIS — J4520 Mild intermittent asthma, uncomplicated: Secondary | ICD-10-CM | POA: Diagnosis not present

## 2018-03-02 DIAGNOSIS — E559 Vitamin D deficiency, unspecified: Secondary | ICD-10-CM | POA: Diagnosis not present

## 2018-03-02 DIAGNOSIS — I10 Essential (primary) hypertension: Secondary | ICD-10-CM | POA: Diagnosis not present

## 2018-03-02 DIAGNOSIS — R351 Nocturia: Secondary | ICD-10-CM | POA: Diagnosis not present

## 2018-03-02 DIAGNOSIS — E785 Hyperlipidemia, unspecified: Secondary | ICD-10-CM | POA: Diagnosis not present

## 2018-03-02 LAB — BASIC METABOLIC PANEL
BUN: 8 mg/dL (ref 6–23)
CHLORIDE: 102 meq/L (ref 96–112)
CO2: 29 meq/L (ref 19–32)
Calcium: 10.1 mg/dL (ref 8.4–10.5)
Creatinine, Ser: 1.23 mg/dL (ref 0.40–1.50)
GFR: 74.32 mL/min (ref >60.00)
GLUCOSE: 101 mg/dL — AB (ref 70–99)
POTASSIUM: 4.8 meq/L (ref 3.5–5.1)
SODIUM: 138 meq/L (ref 135–145)

## 2018-03-02 LAB — LIPID PANEL
CHOL/HDL RATIO: 4
Cholesterol: 192 mg/dL (ref 0–200)
HDL: 50.4 mg/dL (ref >39.00)
LDL CALC: 104 mg/dL — AB (ref 0–99)
NONHDL: 141.99
TRIGLYCERIDES: 191 mg/dL — AB (ref 0.0–149.0)
VLDL: 38.2 mg/dL (ref 0.0–40.0)

## 2018-03-02 MED ORDER — DOXAZOSIN MESYLATE 1 MG PO TABS: 1.0000 mg | ORAL_TABLET | Freq: Every day | ORAL | 3 refills | Status: DC

## 2018-03-02 MED FILL — DOXAZOSIN MESYLATE TAB 1 MG: 1 | 90 days supply | Qty: 90 | Fill #0

## 2018-03-02 NOTE — Assessment & Plan Note (Signed)
Adequately controlled basically with non pharmacologic treatment. Low salt diet recommended. Eye exam recommended annually. F/U in 12 months, before if needed.

## 2018-03-02 NOTE — Patient Instructions (Addendum)
A few things to remember from today's visit:   Essential hypertension - Plan: Basic metabolic panel  Hyperlipidemia, unspecified hyperlipidemia type - Plan: Lipid panel  Vitamin D deficiency - Plan: VITAMIN D 25 Hydroxy (Vit-D Deficiency, Fractures)  Mild intermittent asthma, uncomplicated   Please be sure medication list is accurate. If a new problem present, please set up appointment sooner than planned today.

## 2018-03-02 NOTE — Assessment & Plan Note (Signed)
Cardura helping with nocturia. No changes today,  He also will continue following with urologist, pending prostate Bx in 03/2018.

## 2018-03-02 NOTE — Assessment & Plan Note (Signed)
Well controlled. Recommend considering daily Advair during Spring and Fall. Instructed about warning signs. F/U annually.

## 2018-03-02 NOTE — Assessment & Plan Note (Signed)
No changes in current management, will follow labs done today and will give further recommendations accordingly. F/U in 6-12 months.  

## 2018-03-03 LAB — VITAMIN D 25 HYDROXY (VIT D DEFICIENCY, FRACTURES): VITD: 25.51 ng/mL — ABNORMAL LOW (ref 30.00–100.00)

## 2018-03-10 ENCOUNTER — Other Ambulatory Visit: Payer: Self-pay | Admitting: *Deleted

## 2018-03-10 MED ORDER — ATORVASTATIN CALCIUM 10 MG PO TABS: 10.0000 mg | ORAL_TABLET | Freq: Every day | ORAL | 3 refills | Status: DC

## 2018-03-23 DIAGNOSIS — D075 Carcinoma in situ of prostate: Secondary | ICD-10-CM | POA: Diagnosis not present

## 2018-03-23 DIAGNOSIS — R972 Elevated prostate specific antigen [PSA]: Secondary | ICD-10-CM | POA: Diagnosis not present

## 2018-03-23 DIAGNOSIS — C61 Malignant neoplasm of prostate: Secondary | ICD-10-CM | POA: Diagnosis not present

## 2018-04-02 DIAGNOSIS — N401 Enlarged prostate with lower urinary tract symptoms: Secondary | ICD-10-CM | POA: Diagnosis not present

## 2018-04-02 DIAGNOSIS — C61 Malignant neoplasm of prostate: Secondary | ICD-10-CM | POA: Diagnosis not present

## 2018-04-02 DIAGNOSIS — R3912 Poor urinary stream: Secondary | ICD-10-CM | POA: Diagnosis not present

## 2018-04-02 DIAGNOSIS — N5201 Erectile dysfunction due to arterial insufficiency: Secondary | ICD-10-CM | POA: Diagnosis not present

## 2018-04-03 ENCOUNTER — Encounter: Payer: Self-pay | Admitting: Radiation Oncology

## 2018-04-21 MED FILL — ADVAIR 250/50 DISKUS: 250-50 | 30 days supply | Qty: 60 | Fill #2

## 2018-04-28 ENCOUNTER — Encounter: Payer: Self-pay | Admitting: Radiation Oncology

## 2018-04-28 NOTE — Progress Notes (Signed)
GU Location of Tumor / Histology: prostatic adenocarcinoma  If Prostate Cancer, Gleason Score is (4 + 3) and PSA is (7.72). Prostate volume: 64 mL.  Willie Parks was referred to Dr. Jeffie Pollock by Dorothyann Peng, NP in May 2018 for further evaluation of an elevated PSA.   Biopsies of prostate (if applicable) revealed:    Past/Anticipated interventions by urology, if any: 2014 prostate biopsy (atypical), repeat biopsy, tx options discussed, referral to radiation oncology, follow up with Dr. Jeffie Pollock for possible initiation of short term ADT  Past/Anticipated interventions by medical oncology, if any: no  Weight changes, if any: no  Bowel/Bladder complaints, if any: Reports urgency, weak stream, intermittency, post void dribble and nocturia x 1. IPSS 15.  Nausea/Vomiting, if any: no  Pain issues, if any:  Low back left side pain worse with activity and prolonged standing. Patient doesn't take any medication to manage this pain. Reports the pain has been present x1 month.  SAFETY ISSUES:  Prior radiation? no  Pacemaker/ICD? no  Possible current pregnancy? no  Is the patient on methotrexate? no  Current Complaints / other details:  72 year old male. Second marriage. Resides with wife in [place]. Patient has one grown daughter. No family hx of cancer that he is aware of.

## 2018-04-29 ENCOUNTER — Encounter: Payer: Self-pay | Admitting: Radiation Oncology

## 2018-04-29 ENCOUNTER — Ambulatory Visit: Payer: Medicare HMO | Admitting: Radiation Oncology

## 2018-04-29 ENCOUNTER — Ambulatory Visit
Admission: RE | Admit: 2018-04-29 | Discharge: 2018-04-29 | Disposition: A | Discharge status: Home / Self Care | Payer: Medicare HMO | Source: Ambulatory Visit | Attending: Radiation Oncology | Admitting: Radiation Oncology

## 2018-04-29 ENCOUNTER — Other Ambulatory Visit: Payer: Self-pay

## 2018-04-29 VITALS — BP 144/83 | HR 63 | Temp 98.2°F | Resp 18 | Wt 185.4 lb

## 2018-04-29 DIAGNOSIS — J45909 Unspecified asthma, uncomplicated: Secondary | ICD-10-CM | POA: Diagnosis not present

## 2018-04-29 DIAGNOSIS — C61 Malignant neoplasm of prostate: Secondary | ICD-10-CM

## 2018-04-29 DIAGNOSIS — E785 Hyperlipidemia, unspecified: Secondary | ICD-10-CM | POA: Diagnosis not present

## 2018-04-29 DIAGNOSIS — Z79899 Other long term (current) drug therapy: Secondary | ICD-10-CM | POA: Insufficient documentation

## 2018-04-29 DIAGNOSIS — R972 Elevated prostate specific antigen [PSA]: Secondary | ICD-10-CM | POA: Diagnosis not present

## 2018-04-29 HISTORY — DX: Malignant neoplasm of prostate: C61

## 2018-04-29 NOTE — Progress Notes (Signed)
Radiation Oncology         [phone] ________________________________  Initial Outpatient Consultation  Name: Willie Parks MRN: [id number]  Date: 04/29/2018  DOB: [birth year]  ME:QASTMH, Malka So, MD  Irine Seal, MD   REFERRING PHYSICIAN: Irine Seal, MD  DIAGNOSIS: 71 y.o. gentleman with Stage T1c adenocarcinoma of the prostate with Gleason Score of 4+3, and PSA of 7.72    ICD-10-CM   1. Malignant neoplasm of prostate [facility]) [place] [facility] referral to Social Work    HISTORY OF PRESENT ILLNESS: Willie Parks is a 72 y.o. male with a diagnosis of prostate cancer. He has a history of elevated PSA of 4.7 with biopsy x2 with atypia and HGPIN in 2014 while living in the [place]. Since relocating to [facility], his PSA had continued to be followed with his PCP.  He was noted to have an elevated PSA of 6.34 by Dr. Betty Martinique on 02/10/17.  Accordingly, he was referred for evaluation in urology by Dr. Jeffie Pollock on 05/14/17,  digital rectal examination was performed at that time revealing symmetrical lobes and no nodularity. A repeat PSA on 05/22/17 remained elevated at 6.02, thus the patient was recommended to have a prostate MRI for further evaluation.  MRI prostate was performed on 08/08/17 and showed no worrisome lesions.   He returned for follow-up with Dr. Jeffie Pollock on 01/26/18 and his PSA was noted to be further elevated at 7.72. DRE remained normal without nodularity. Due to continued rise in PSA, the recommendation at that time was for transrectal ultrasound with 12 biopsies of the prostate and this was performed on 03/23/18.  The prostate volume measured 64 cc.  Out of 12 core biopsies, 3 were positive.  The maximum Gleason score was 4+3, and this was seen in left base and left base lateral. Gleason 3+3 was noted in the right lateral apex.    The patient reviewed the biopsy results with his urologist and he has kindly been referred today for discussion of potential radiation treatment  options.   PREVIOUS RADIATION THERAPY: No  PAST MEDICAL HISTORY:  Past Medical History:  Diagnosis Date  . Allergy   . Asthma   . Chicken pox   . Hyperlipidemia   . Prostate cancer ([place])       PAST SURGICAL HISTORY: Past Surgical History:  Procedure Laterality Date  . KNEE ARTHROSCOPY Right   . WRIST SURGERY Right     FAMILY HISTORY:  Family History  Problem Relation Age of Onset  . Cancer Neg Hx   . Diabetes Neg Hx   . Prostate cancer Neg Hx   . Colon cancer Neg Hx   . Breast cancer Neg Hx     SOCIAL HISTORY:  Social History   Socioeconomic History  . Marital status: Married    Spouse name: Not on file  . Number of children: Not on file  . Years of education: Not on file  . Highest education level: Not on file  Occupational History  . Occupation: retired  Scientific laboratory technician  . Financial resource strain: Not on file  . Food insecurity:    Worry: Not on file    Inability: Not on file  . Transportation needs:    Medical: Not on file    Non-medical: Not on file  Tobacco Use  . Smoking status: Never Smoker  . Smokeless tobacco: Never Used  Substance and Sexual Activity  . Alcohol use: Yes    Alcohol/week: 0.6 oz    Types: 1 Standard drinks  or equivalent per week    Comment: per week  . Drug use: No  . Sexual activity: Not Currently  Lifestyle  . Physical activity:    Days per week: Not on file    Minutes per session: Not on file  . Stress: Not on file  Relationships  . Social connections:    Talks on phone: Not on file    Gets together: Not on file    Attends religious service: Not on file    Active member of club or organization: Not on file    Attends meetings of clubs or organizations: Not on file    Relationship status: Not on file  . Intimate partner violence:    Fear of current or ex partner: Not on file    Emotionally abused: Not on file    Physically abused: Not on file    Forced sexual activity: Not on file  Other Topics Concern  . Not on  file  Social History Narrative   Second marriage. Resides with wife in [place]. Retired. Has one grown child. 15 grandchildren to include his step grandchildren.     ALLERGIES: Penicillins  MEDICATIONS:  Current Outpatient Medications  Medication Sig Dispense Refill  . atorvastatin (LIPITOR) 10 MG tablet Take 1 tablet (10 mg total) by mouth daily. 90 tablet 3  . Calcium Carbonate-Vitamin D (CALCARB 600/D PO) Take by mouth daily.    Marland Kitchen doxazosin (CARDURA) 1 MG tablet Take 1 tablet (1 mg total) by mouth at bedtime. 90 tablet 3  . Fluticasone-Salmeterol (ADVAIR) 250-50 MCG/DOSE AEPB Inhale 1 puff into the lungs 2 (two) times daily. 60 each 11  . diazepam (VALIUM) 10 MG tablet   0   No current facility-administered medications for this encounter.     REVIEW OF SYSTEMS:  On review of systems, the patient reports that he is doing well overall. He denies any chest pain, shortness of breath, cough, fevers, chills, night sweats, unintended weight changes. He denies any bowel disturbances, and denies abdominal pain, nausea or vomiting. He reports a one month history of left low back pain. The pain worsens with activity and standing and improves with rest.  The pain does not radiate into the buttocks or LEs and does not wake him from sleep at night  His current IPSS is 15, indicating moderate urinary symptoms. He reports urgency, weak stream, intermittency, post void dribble, and nocturia. He is able to complete sexual activity with most attempts. A complete review of systems is obtained and is otherwise negative.    PHYSICAL EXAM:  Wt Readings from Last 3 Encounters:  04/29/18 185 lb 6.4 oz (84.1 kg)  03/02/18 193 lb 8 oz (87.8 kg)  08/08/17 190 lb (86.2 kg)   Temp Readings from Last 3 Encounters:  04/29/18 98.2 F (36.8 C) (Oral)  03/02/18 97.8 F (36.6 C)  10/08/16 97.9 F (36.6 C) (Oral)   BP Readings from Last 3 Encounters:  04/29/18 (!) 144/83  03/02/18 124/82  06/18/17 120/75    Pulse Readings from Last 3 Encounters:  04/29/18 63  03/02/18 61  06/18/17 83   Pain Assessment Pain Score: 0-No pain/10  In general this is a well appearing african american gentleman in no acute distress. He is alert and oriented x4 and appropriate throughout the examination. HEENT reveals that the patient is normocephalic, atraumatic. EOMs are intact. PERRLA. Skin is intact without any evidence of gross lesions. Cardiovascular exam reveals a regular rate and rhythm, no clicks rubs or murmurs  are auscultated. Chest is clear to auscultation bilaterally. Lymphatic assessment is performed and does not reveal any adenopathy in the cervical, supraclavicular, axillary, or inguinal chains. Abdomen has active bowel sounds in all quadrants and is intact. The abdomen is soft, non tender, non distended. Lower extremities are negative for pretibial pitting edema, deep calf tenderness, cyanosis or clubbing.  KPS = 100  100 - Normal; no complaints; no evidence of disease. 90   - Able to carry on normal activity; minor signs or symptoms of disease. 80   - Normal activity with effort; some signs or symptoms of disease. 67   - Cares for self; unable to carry on normal activity or to do active work. 60   - Requires occasional assistance, but is able to care for most of his personal needs. 50   - Requires considerable assistance and frequent medical care. 17   - Disabled; requires special care and assistance. 62   - Severely disabled; hospital admission is indicated although death not imminent. 44   - Very sick; hospital admission necessary; active supportive treatment necessary. 10   - Moribund; fatal processes progressing rapidly. 0     - Dead  Karnofsky DA, Abelmann [place], Craver LS and Burchenal [facility] [phone]) The use of the nitrogen mustards in the palliative treatment of carcinoma: with particular reference to bronchogenic carcinoma Cancer 1 [phone]  LABORATORY DATA:  No results found for: WBC, HGB, HCT,  MCV, PLT Lab Results  Component Value Date   NA 138 03/02/2018   K 4.8 03/02/2018   CL 102 03/02/2018   CO2 29 03/02/2018   No results found for: ALT, AST, GGT, ALKPHOS, BILITOT   RADIOGRAPHY: No results found.    IMPRESSION/PLAN: 1. 72 y.o. gentleman with Stage T1c adenocarcinoma of the prostate with Gleason Score of 4+3, and PSA of 7.72. we discussed the patient's workup and outlined the nature of prostate cancer in this setting. The patient's T stage, Gleason's score, and PSA put him into the unfavorable intermediate risk group. Accordingly, he is eligible for a variety of potential treatment options including ST-ADT in comination with either brachytherapy, 5.5 - 8 weeks of external radiation or 5 weeks of external radiation combined with a brachytherapy boost. We discussed the available radiation techniques, and focused on the details of logistics and delivery. We discussed and outlined the risks, benefits, short and long-term effects associated with radiotherapy and compared and contrasted these with prostatectomy. He is not interested in prostatectomy and is not an ideal candidate for brachytherapy given his large prostate volume and chronic LUTS We also discussed the role of SpaceOAR in reducing the rectal toxicity associated with radiotherapy.  The patient was encouraged to ask questions that were answered to his stated satisfaction.    At the conclusion of our conversation, the patient is interested in proceeding with 5.5 weeks of external beam radiation combined with ST-ADT.  We will share our findings with Dr. Jeffie Pollock and move forward with coordinating a follow-up visit to initiate ADT as soon as possible as the patient is anxious to get started with his treatment.  We would anticipate beginning a 5.5 week course of daily radiotherapy after approximately 2 months of ADT and will coordinate a CT simulation appointment for the patient prior to beginning radiotherapy. He knows to call with any  questions or concerns in the interim.  We spent 60 minutes minutes face to face with the patient and more than 50% of that time was spent in counseling and/or  coordination of care.    Nicholos Johns, PA-C    Tyler Pita, MD  [facility] Direct Dial: [phone]  Fax: [phone] [email or web address]  Skype  LinkedIn    Page Me      This document serves as a record of services personally performed by Tyler Pita, MD and Freeman Caldron, PA-C. It was created on their behalf by Bethann Humble, a trained medical scribe. The creation of this record is based on the scribe's personal observations and the provider's statements to them. This document has been checked and approved by the attending provider.

## 2018-04-29 NOTE — Progress Notes (Signed)
See progress note under physician encounter. 

## 2018-04-30 ENCOUNTER — Encounter: Payer: Self-pay | Admitting: General Practice

## 2018-04-30 ENCOUNTER — Telehealth: Payer: Self-pay | Admitting: Medical Oncology

## 2018-04-30 DIAGNOSIS — C61 Malignant neoplasm of prostate: Secondary | ICD-10-CM | POA: Insufficient documentation

## 2018-04-30 NOTE — Telephone Encounter (Signed)
Called Mr. Ress to introduce myself as the prostate nurse navigator and my role and spoke with his wife. I was unable to meet them 5/08 when he consulted with Dr.Manning. She states the consult went well and was very informative and they do not have any questions at this time. I gave her my office number and asked her to call with questions or concerns. She informed me that Anne-SW called this morning and is sending a packet about support groups.  She states that he has not received a call from Dr. Ralene Muskrat office for hormone injection. I explained that it may take several days to get it scheduled due to insurance approval. She voiced understanding.

## 2018-04-30 NOTE — Progress Notes (Signed)
[place] Psychosocial Distress Screening Clinical Social Work  Clinical Social Work was referred by distress screening protocol.  The patient scored a 6 on the Psychosocial Distress Thermometer which indicates moderate distress. Clinical Social Worker Edwyna Shell to assess for distress and other psychosocial needs. Spoke w wife, patient lives w her.  No concerns re help at home or transportation.  Patient is physically active and mobile.  Will mail packet of information on [place], encouraged wife to review w husband and contact as needed for resources and support.    ONCBCN DISTRESS SCREENING 04/29/2018  Screening Type Initial Screening  Distress experienced in past week (1-10) 6  Emotional problem type Adjusting to illness  Information Concerns Type Lack of info about treatment  Physical Problem type Pain;Constipation/diarrhea;Sexual problems  Physician notified of physical symptoms Yes  Referral to clinical psychology No  Referral to clinical social work Yes  Referral to dietition No  Referral to financial advocate No  Referral to support programs Yes  Referral to palliative care No    Clinical Social Worker follow up needed: No.  If yes, follow up plan:   Edwyna Shell, LCSW Clinical Social Worker Phone:  [phone]

## 2018-05-01 ENCOUNTER — Telehealth: Payer: Self-pay | Admitting: *Deleted

## 2018-05-01 NOTE — Telephone Encounter (Signed)
CALLED PATIENT TO INFORM THAT I HAVE CALLED [facility] TO ARRANGE ADT APPT., I TOLD HIM THAT THEY PUT ME IN DR. Ralene Muskrat NURSE'S VM AND SHE WILL CALL ME WITH AN APPT. DATE AND TIME, PT. VERIFIED UNDERSTANDING THIS

## 2018-05-05 ENCOUNTER — Telehealth: Payer: Self-pay | Admitting: *Deleted

## 2018-05-05 ENCOUNTER — Telehealth: Payer: Self-pay | Admitting: Medical Oncology

## 2018-05-05 NOTE — Telephone Encounter (Signed)
Willie Parks-wife called stating that her husband has not received appointment for his hormone injections. I explained that Dr. Ralene Muskrat office will get insurance approval and then they will call with an appointment. She voiced understanding. I did forward this to [place].

## 2018-05-05 NOTE — Telephone Encounter (Signed)
Called Dr. Ralene Muskrat Office to arrange ADT Inj. for this patient, spoke with Whitney the nurse and they will get this precerted by insurance then they will get him scheduled, Whitney to call me with an appt. Date and time.

## 2018-05-11 ENCOUNTER — Telehealth: Payer: Self-pay | Admitting: *Deleted

## 2018-05-11 NOTE — Telephone Encounter (Signed)
Called patient to inform of sim appt. For 06-26-18 - 1 pm- arrival time - 12:45 p.m. @ [place], spoke with patient and he is aware of this appt.

## 2018-05-11 NOTE — Telephone Encounter (Signed)
Called patient to inform of ADT Inj. for 05-13-18- arrival time - 2:45 pm @ Dr. Ralene Muskrat Office, spoke with patient and he is aware of this inj.

## 2018-05-13 ENCOUNTER — Encounter: Payer: Self-pay | Admitting: Medical Oncology

## 2018-05-13 DIAGNOSIS — N401 Enlarged prostate with lower urinary tract symptoms: Secondary | ICD-10-CM | POA: Diagnosis not present

## 2018-05-13 DIAGNOSIS — C61 Malignant neoplasm of prostate: Secondary | ICD-10-CM | POA: Diagnosis not present

## 2018-05-13 DIAGNOSIS — Z5111 Encounter for antineoplastic chemotherapy: Secondary | ICD-10-CM | POA: Diagnosis not present

## 2018-05-13 DIAGNOSIS — R3912 Poor urinary stream: Secondary | ICD-10-CM | POA: Diagnosis not present

## 2018-05-28 DIAGNOSIS — C61 Malignant neoplasm of prostate: Secondary | ICD-10-CM | POA: Diagnosis not present

## 2018-06-02 MED FILL — DOXAZOSIN MESYLATE TAB 1 MG: 1 | 90 days supply | Qty: 90 | Fill #1

## 2018-06-10 DIAGNOSIS — R972 Elevated prostate specific antigen [PSA]: Secondary | ICD-10-CM | POA: Diagnosis not present

## 2018-06-10 DIAGNOSIS — C61 Malignant neoplasm of prostate: Secondary | ICD-10-CM | POA: Diagnosis not present

## 2018-06-10 DIAGNOSIS — N401 Enlarged prostate with lower urinary tract symptoms: Secondary | ICD-10-CM | POA: Diagnosis not present

## 2018-06-10 DIAGNOSIS — R3912 Poor urinary stream: Secondary | ICD-10-CM | POA: Diagnosis not present

## 2018-06-12 ENCOUNTER — Telehealth: Payer: Self-pay | Admitting: *Deleted

## 2018-06-12 NOTE — Telephone Encounter (Signed)
Called patient to inform of fid, markers and space oar for 07-20-18 @ Dr. Ralene Muskrat Office and his sim on August 2 @ 1 pm @ Dr. Johny Shears Office, spoke with patient and he is aware of these appts.

## 2018-06-22 ENCOUNTER — Ambulatory Visit: Payer: Self-pay

## 2018-06-22 NOTE — Telephone Encounter (Signed)
Spoke with patient wife, aware of rec's per Dr Martinique.  Pt is coming in tomorrow 06/23/18 at 8:30a for an OV - symptoms are currently resolved.  Aware that if symptoms return to go to ED.   Will send to Dr Martinique as Juluis Rainier.

## 2018-06-22 NOTE — Telephone Encounter (Signed)
Dr Martinique please advise if needs to go to UC/ED or can come in and see you tomorrow?

## 2018-06-22 NOTE — Telephone Encounter (Signed)
If slurred speech resolved and no other focal deficit I can see him tomorrow. If still having slurred speech and/or having focal weakness I prefer him to go today ER for brain imaging.  Thanks, BJ

## 2018-06-22 NOTE — Telephone Encounter (Signed)
Incoming call from patients wife who is on the [place].  Stating that her husband experienced slurred speech yesterday. This occurred approximately 5:30pm  Patient has been receiving an injection recently from the Urologist for prostate  CA.  Wife concerned if this injections was related to the slurred speech.  This is the first occurrence. Lasted from 5 to 7 min.  Did not experience any other symptoms at the time.  Denied any chest pain,  headaches blurred visions, numbness.  The wife would like to schedule an appointment ,which protocol recommends patient to see Physician in three days.  Provided Care Advice and scheduled appointment.    Reason for Disposition . [1] Loss of speech or garbled speech AND [2] is a chronic symptom (recurrent or ongoing AND present > 4 weeks)  Answer Assessment - Initial Assessment Questions 1. SYMPTOM: "What is the main symptom you are concerned about?" (e.g., weakness, numbness)     Slurred speech  2. ONSET: "When did this start?" (minutes, hours, days; while sleeping)     Yesterday 5:30 pm 3. LAST NORMAL: "When was the last time you were normal (no symptoms)the slurred speech was only for abot 5 to 7 minutes.        4. PATTERN "Does this come and go, or has it been constant since it started?"  "Is it present now?"not present now this was the first occurrence.        5. CARDIAC SYMPTOMS: "Have you had any of the following symptoms: chest pain, difficulty breathing, palpitations?"     No other symptons 6. NEUROLOGIC SYMPTOMS: "Have you had any of the following symptoms: headache, dizziness, vision loss, double vision, changes in speech, unsteady on your feet?"     no 7. OTHER SYMPTOMS: "Do you have any other symptoms?"     no  Protocols used: NEUROLOGIC DEFICIT-A-AH

## 2018-06-23 ENCOUNTER — Ambulatory Visit (INDEPENDENT_AMBULATORY_CARE_PROVIDER_SITE_OTHER): Payer: Medicare HMO | Admitting: Family Medicine

## 2018-06-23 ENCOUNTER — Encounter: Payer: Self-pay | Admitting: Family Medicine

## 2018-06-23 VITALS — BP 118/76 | HR 64 | Temp 98.5°F | Resp 12 | Ht 72.0 in | Wt 183.0 lb

## 2018-06-23 DIAGNOSIS — E785 Hyperlipidemia, unspecified: Secondary | ICD-10-CM

## 2018-06-23 DIAGNOSIS — R05 Cough: Secondary | ICD-10-CM

## 2018-06-23 DIAGNOSIS — R059 Cough, unspecified: Secondary | ICD-10-CM

## 2018-06-23 DIAGNOSIS — Z9189 Other specified personal risk factors, not elsewhere classified: Secondary | ICD-10-CM | POA: Diagnosis not present

## 2018-06-23 DIAGNOSIS — I10 Essential (primary) hypertension: Secondary | ICD-10-CM | POA: Diagnosis not present

## 2018-06-23 DIAGNOSIS — Z1159 Encounter for screening for other viral diseases: Secondary | ICD-10-CM | POA: Diagnosis not present

## 2018-06-23 DIAGNOSIS — R4781 Slurred speech: Secondary | ICD-10-CM | POA: Diagnosis not present

## 2018-06-23 LAB — BASIC METABOLIC PANEL
BUN: 10 mg/dL (ref 6–23)
CO2: 29 meq/L (ref 19–32)
CREATININE: 1.26 mg/dL (ref 0.40–1.50)
Calcium: 10.3 mg/dL (ref 8.4–10.5)
Chloride: 102 mEq/L (ref 96–112)
GFR: 72.22 mL/min (ref >60.00)
GLUCOSE: 105 mg/dL — AB (ref 70–99)
Potassium: 5.3 mEq/L — ABNORMAL HIGH (ref 3.5–5.1)
Sodium: 139 mEq/L (ref 135–145)

## 2018-06-23 LAB — LIPID PANEL
CHOL/HDL RATIO: 3
Cholesterol: 184 mg/dL (ref 0–200)
HDL: 57.7 mg/dL (ref >39.00)
LDL Cholesterol: 88 mg/dL (ref 0–99)
NONHDL: 126.54
Triglycerides: 193 mg/dL — ABNORMAL HIGH (ref 0.0–149.0)
VLDL: 38.6 mg/dL (ref 0.0–40.0)

## 2018-06-23 NOTE — Progress Notes (Signed)
ACUTE VISIT   HPI:  Chief Complaint  Patient presents with  . Aphasia    on Sunday, lasted about 5 or 6 minutes couldn't get words out  . Cough    with yellow/greenish phlem     [name] is a 72 y.o. male, who is here today with his wife complaining of transient episode of slurred speech that lasted about 6 min, 06/21/18 around 5:30-6 pm. .  He states that he could not "get words out" when trying to talk with his wife. No focal weakness.  After 6 min he was back to his normal state. No prior Hx of similar symptoms.  Hypertension currently on doxazosin 1 mg daily. Is not checking BP at home.  He denies associated headache, visual changes, chest pain, palpitations, dyspnea, or diaphoresis. He states that he could not  Currently he is on ongoing chemotherapy for prostate cancer.   HLD: On Atorvastatin 10 mg daily. He is following low fat diet. He is tolerating medication well, denies side effects.  03/02/2018 lipid panel: TC 192. TG 191 HDL 50 LDL 104.  Cough: His wife also mentions "sporadic" productive cough ,clear and sometimes greenish sputum for the past week. No hemoptysis. No dyspnea, wheezing, heartburn, nausea, or vomiting. He states that he does not feel sick. Negative for changes in appetite.  No sick contacts or recent travel. He has not taken OTC medication.  Hx of asthma,he has used Advair 250-50 mcg prn.    Review of Systems  Constitutional: Negative for activity change, appetite change, fatigue and fever.  HENT: Negative for nosebleeds, sore throat and trouble swallowing.   Eyes: Negative for redness and visual disturbance.  Respiratory: Positive for cough. Negative for shortness of breath and wheezing.   Cardiovascular: Negative for chest pain, palpitations and leg swelling.  Gastrointestinal: Negative for abdominal pain, nausea and vomiting.  Endocrine: Negative for cold intolerance and heat intolerance.  Genitourinary:  Negative for decreased urine volume, dysuria and hematuria.  Musculoskeletal: Negative for gait problem and myalgias.  Skin: Negative for pallor and rash.  Neurological: Positive for speech difficulty. Negative for dizziness, seizures, syncope, weakness, numbness and headaches.  Psychiatric/Behavioral: Negative for confusion and hallucinations.      Current Outpatient Medications on File Prior to Visit  Medication Sig Dispense Refill  . Calcium Carbonate-Vitamin D (CALCARB 600/D PO) Take by mouth daily.    . diazepam (VALIUM) 10 MG tablet   0  . doxazosin (CARDURA) 1 MG tablet Take 1 tablet (1 mg total) by mouth at bedtime. 90 tablet 3  . Fluticasone-Salmeterol (ADVAIR) 250-50 MCG/DOSE AEPB Inhale 1 puff into the lungs 2 (two) times daily. 60 each 11   No current facility-administered medications on file prior to visit.      Past Medical History:  Diagnosis Date  . Allergy   . Asthma   . Chicken pox   . Hyperlipidemia   . Prostate cancer ([place])    Allergies  Allergen Reactions  . Penicillins Other (See Comments)    Dizziness     Social History   Socioeconomic History  . Marital status: Married    Spouse name: Not on file  . Number of children: Not on file  . Years of education: Not on file  . Highest education level: Not on file  Occupational History  . Occupation: retired  Scientific laboratory technician  . Financial resource strain: Not on file  . Food insecurity:    Worry: Not on file  Inability: Not on file  . Transportation needs:    Medical: Not on file    Non-medical: Not on file  Tobacco Use  . Smoking status: Never Smoker  . Smokeless tobacco: Never Used  Substance and Sexual Activity  . Alcohol use: Yes    Alcohol/week: 0.6 oz    Types: 1 Standard drinks or equivalent per week    Comment: per week  . Drug use: No  . Sexual activity: Not Currently  Lifestyle  . Physical activity:    Days per week: Not on file    Minutes per session: Not on file  . Stress: Not  on file  Relationships  . Social connections:    Talks on phone: Not on file    Gets together: Not on file    Attends religious service: Not on file    Active member of club or organization: Not on file    Attends meetings of clubs or organizations: Not on file    Relationship status: Not on file  Other Topics Concern  . Not on file  Social History Narrative   Second marriage. Resides with wife in [place]. Retired. Has one grown child. 15 grandchildren to include his step grandchildren.     Vitals:   06/23/18 0752  BP: 118/76  Pulse: 64  Resp: 12  Temp: 98.5 F (36.9 C)  SpO2: 97%   Body mass index is 24.82 kg/m.   Physical Exam  Nursing note and vitals reviewed. Constitutional: He is oriented to person, place, and time. He appears well-developed and well-nourished. No distress.  HENT:  Head: Normocephalic and atraumatic.  Mouth/Throat: Oropharynx is clear and moist and mucous membranes are normal.  Eyes: Pupils are equal, round, and reactive to light. Conjunctivae and EOM are normal.  Cardiovascular: Normal rate and regular rhythm.  No murmur heard. Pulses:      Dorsalis pedis pulses are 2+ on the right side, and 2+ on the left side.  Respiratory: Effort normal and breath sounds normal. No respiratory distress.  GI: Soft. He exhibits no mass. There is no hepatomegaly. There is no tenderness.  Musculoskeletal: He exhibits no edema.  Lymphadenopathy:    He has no cervical adenopathy.  Neurological: He is alert and oriented to person, place, and time. He has normal strength. No cranial nerve deficit. Gait normal.  Reflex Scores:      Bicep reflexes are 2+ on the right side and 2+ on the left side.      Patellar reflexes are 2+ on the right side and 2+ on the left side. Skin: Skin is warm. No rash noted. No erythema.  Psychiatric: He has a normal mood and affect. Cognition and memory are normal.  Well groomed, good eye contact.      ASSESSMENT AND PLAN:   Mr.  [name] was seen today for aphasia and cough.  Diagnoses and all orders for this visit:  Lab Results  Component Value Date   CHOL 184 06/23/2018   HDL 57.70 06/23/2018   LDLCALC 88 06/23/2018   LDLDIRECT 115.0 06/18/2017   TRIG 193.0 (H) 06/23/2018   CHOLHDL 3 06/23/2018   Lab Results  Component Value Date   CREATININE 1.26 06/23/2018   BUN 10 06/23/2018   NA 139 06/23/2018   K 5.3 (H) 06/23/2018   CL 102 06/23/2018   CO2 29 06/23/2018    Essential hypertension  BP adequately controlled. No changes on doxazosin 1 mg daily. Monitor BP at home periodically. Low-salt diet.  Hyperlipidemia, unspecified hyperlipidemia type  No changes in current management, will follow labs done today and will give further recommendations accordingly. Goal LDL less than 100, ideally under 70.  -     Lipid panel  Slurred speech  ?  TIA. Neurologic examination today otherwise normal. Further recommendations will be given according to imaging results. Instructed about warning signs.  -     Basic metabolic panel -     VAS US CAROTID; Future -     MR Brain Wo Contrast; Future  Encounter for HCV screening test for high risk patient -     Hepatitis C antibody  Cough  Auscultation negative today. At this time I do not think imaging is needed. Monitor for worsening symptoms. Instructed about warning signs.     Return in about 1 month (around 07/21/2018).     Betty G. Martinique, MD  [facility]. [place] office.

## 2018-06-23 NOTE — Patient Instructions (Signed)
A few things to remember from today's visit:   Essential hypertension  Hyperlipidemia, unspecified hyperlipidemia type - Plan: Lipid panel  Slurred speech - Plan: Basic metabolic panel, VAS US CAROTID, MR Brain Wo Contrast   Transient Ischemic Attack A transient ischemic attack (TIA) is a "warning stroke" that causes stroke-like symptoms. A TIA does not cause lasting damage to the brain. The symptoms of a TIA can happen fast and do not last long. It is important to know the symptoms of a TIA and what to do. This can help prevent stroke or death. Follow these instructions at home:  Take medicines only as told by your doctor. Make sure you understand all of the instructions.  You may need to take aspirin or warfarin medicine. Warfarin needs to be taken exactly as told. ? Taking too much or too little warfarin is dangerous. Blood tests must be done as often as told by your doctor. A PT blood test measures how long it takes for blood to clot. Your PT is used to calculate another value called an INR. Your PT and INR help your doctor adjust your warfarin dosage. He or she will make sure you are taking the right amount. ? Food can cause problems with warfarin and affect the results of your blood tests. This is true for foods high in vitamin K. Eat the same amount of foods high in vitamin K each day. Foods high in vitamin K include spinach, kale, broccoli, cabbage, collard and turnip greens, Brussels sprouts, peas, cauliflower, seaweed, and parsley. Other foods high in vitamin K include beef and pork liver, green tea, and soybean oil. Eat the same amount of foods high in vitamin K each day. Avoid big changes in your diet. Tell your doctor before changing your diet. Talk to a food specialist (dietitian) if you have questions. ? Many medicines can cause problems with warfarin and affect your PT and INR. Tell your doctor about all medicines you take. This includes vitamins and dietary pills (supplements). Do  not take or stop taking any prescribed or over-the-counter medicines unless your doctor tells you to. ? Warfarin can cause more bruising or bleeding. Hold pressure over any cuts for longer than normal. Talk to your doctor about other side effects of warfarin. ? Avoid sports or activities that may cause injury or bleeding. ? Be careful when you shave, floss, or use sharp objects. ? Avoid or drink very little alcohol while taking warfarin. Tell your doctor if you change how much alcohol you drink. ? Tell your dentist and other doctors that you take warfarin before any procedures.  Follow your diet program as told, if you are given one.  Keep a healthy weight.  Stay active. Try to get at least 30 minutes of activity on all or most days.  Do not use any tobacco products, including cigarettes, chewing tobacco, or electronic cigarettes. If you need help quitting, ask your doctor.  Limit alcohol intake to no more than 1 drink per day for nonpregnant women and 2 drinks per day for men. One drink equals 12 ounces of beer, 5 ounces of wine, or 1 ounces of hard liquor.  Do not abuse drugs.  Keep your home safe so you do not fall. You can do this by: ? Putting grab bars in the bedroom and bathroom. ? Raising toilet seats. ? Putting a seat in the shower.  Keep all follow-up visits as told by your doctor. This is important. Contact a doctor if:  Your  personality changes.  You have trouble swallowing.  You have double vision.  You are dizzy.  You have a fever. Get help right away if: These symptoms may be an emergency. Do not wait to see if the symptoms will go away. Get medical help right away. Call your local emergency services (911 in the [place] Do not drive yourself to the hospital.  You have sudden weakness or lose feeling (go numb), especially on one side of the body. This can affect your: ? Face. ? Arm. ? Leg.  You have sudden trouble walking.  You have sudden trouble moving  your arms or legs.  You have sudden confusion.  You have trouble talking.  You have trouble understanding.  You have sudden trouble seeing in one or both eyes.  You lose your balance.  Your movements are not smooth.  You have a sudden, very bad headache with no known cause.  You have new chest pain.  Your heartbeat is unsteady.  You are partly or totally unaware of what is going on around you.  This information is not intended to replace advice given to you by your health care provider. Make sure you discuss any questions you have with your health care provider. Document Released: 09/17/2008 Document Revised: 08/12/2016 Document Reviewed: 03/16/2014 Elsevier Interactive Patient Education  2018 [employer].  Please be sure medication list is accurate. If a new problem present, please set up appointment sooner than planned today.

## 2018-06-23 NOTE — Telephone Encounter (Signed)
Patient came into office to see Dr. Martinique today.

## 2018-06-24 ENCOUNTER — Ambulatory Visit
Admission: RE | Admit: 2018-06-24 | Discharge: 2018-06-24 | Disposition: A | Discharge status: Home / Self Care | Payer: Medicare HMO | Source: Ambulatory Visit | Attending: Family Medicine | Admitting: Family Medicine

## 2018-06-24 ENCOUNTER — Ambulatory Visit (HOSPITAL_COMMUNITY)
Admission: RE | Admit: 2018-06-24 | Discharge: 2018-06-24 | Disposition: A | Discharge status: Home / Self Care | Payer: Medicare HMO | Source: Ambulatory Visit | Attending: Family Medicine | Admitting: Family Medicine

## 2018-06-24 DIAGNOSIS — R4781 Slurred speech: Secondary | ICD-10-CM

## 2018-06-24 DIAGNOSIS — R410 Disorientation, unspecified: Secondary | ICD-10-CM | POA: Diagnosis not present

## 2018-06-24 LAB — HEPATITIS C ANTIBODY
Hepatitis C Ab: NONREACTIVE
SIGNAL TO CUT-OFF: 0.08 (ref <1.00)

## 2018-06-24 NOTE — Progress Notes (Signed)
Carotid Duplex Completed. 

## 2018-06-26 ENCOUNTER — Ambulatory Visit: Payer: Medicare HMO | Admitting: Radiation Oncology

## 2018-06-26 ENCOUNTER — Other Ambulatory Visit: Payer: Self-pay | Admitting: Family Medicine

## 2018-06-26 DIAGNOSIS — E875 Hyperkalemia: Secondary | ICD-10-CM

## 2018-06-26 DIAGNOSIS — E785 Hyperlipidemia, unspecified: Secondary | ICD-10-CM

## 2018-06-26 DIAGNOSIS — G459 Transient cerebral ischemic attack, unspecified: Secondary | ICD-10-CM | POA: Insufficient documentation

## 2018-06-26 MED ORDER — ATORVASTATIN CALCIUM 20 MG PO TABS: 20.0000 mg | ORAL_TABLET | Freq: Every day | ORAL | 3 refills | Status: DC

## 2018-06-29 ENCOUNTER — Other Ambulatory Visit (INDEPENDENT_AMBULATORY_CARE_PROVIDER_SITE_OTHER): Payer: Medicare HMO

## 2018-06-29 DIAGNOSIS — E875 Hyperkalemia: Secondary | ICD-10-CM

## 2018-06-29 LAB — POTASSIUM: POTASSIUM: 4.8 meq/L (ref 3.5–5.1)

## 2018-06-29 MED FILL — ATORVASTATIN CALCIUM 20 MG: 20 | 90 days supply | Qty: 90 | Fill #0

## 2018-06-30 MED FILL — ATORVASTATIN 10 MG TABLET: 10 | 90 days supply | Qty: 90 | Fill #0

## 2018-07-01 ENCOUNTER — Other Ambulatory Visit: Payer: Self-pay | Admitting: Family Medicine

## 2018-07-01 DIAGNOSIS — J4520 Mild intermittent asthma, uncomplicated: Secondary | ICD-10-CM

## 2018-07-01 MED FILL — ADVAIR 250/50 DISKUS: 250-50 | 30 days supply | Qty: 60 | Fill #0

## 2018-07-08 ENCOUNTER — Other Ambulatory Visit: Payer: Self-pay | Admitting: Urology

## 2018-07-08 DIAGNOSIS — C61 Malignant neoplasm of prostate: Secondary | ICD-10-CM

## 2018-07-14 MED FILL — levoFLOXacin 750 MG TABS: 750 | 1 days supply | Qty: 1 | Fill #0

## 2018-07-15 MED FILL — diazePAM 10 MG TABS: 10 | 1 days supply | Qty: 2 | Fill #0

## 2018-07-20 DIAGNOSIS — C61 Malignant neoplasm of prostate: Secondary | ICD-10-CM | POA: Diagnosis not present

## 2018-07-21 ENCOUNTER — Telehealth: Payer: Self-pay | Admitting: *Deleted

## 2018-07-21 NOTE — Telephone Encounter (Signed)
Called patient to inform of sim appt. for 07-24-18 @ 1 pm, spoke with patient's wife and she is aware of this appt.

## 2018-07-23 ENCOUNTER — Telehealth: Payer: Self-pay | Admitting: *Deleted

## 2018-07-23 DIAGNOSIS — C61 Malignant neoplasm of prostate: Secondary | ICD-10-CM | POA: Diagnosis not present

## 2018-07-23 DIAGNOSIS — R300 Dysuria: Secondary | ICD-10-CM | POA: Diagnosis not present

## 2018-07-23 NOTE — Telephone Encounter (Signed)
CALLED PATIENT TO REMIND OF SIM APPT. FOR 07-24-18, LVM FOR A RETURN CALL

## 2018-07-24 ENCOUNTER — Encounter: Payer: Self-pay | Admitting: Medical Oncology

## 2018-07-24 ENCOUNTER — Ambulatory Visit
Admission: RE | Admit: 2018-07-24 | Discharge: 2018-07-24 | Disposition: A | Discharge status: Home / Self Care | Payer: Medicare HMO | Source: Ambulatory Visit | Attending: Radiation Oncology | Admitting: Radiation Oncology

## 2018-07-24 DIAGNOSIS — C61 Malignant neoplasm of prostate: Secondary | ICD-10-CM | POA: Diagnosis not present

## 2018-07-24 DIAGNOSIS — Z510 Encounter for antineoplastic radiation therapy: Secondary | ICD-10-CM | POA: Diagnosis not present

## 2018-07-24 NOTE — Progress Notes (Signed)
  Radiation Oncology         [phone] ________________________________  Name: Willie Parks MRN: [id number]  Date: 07/24/2018  DOB: 04/21/1946  SIMULATION AND TREATMENT PLANNING NOTE    ICD-10-CM   1. Malignant neoplasm of prostate ([place]) C61     DIAGNOSIS:  72 y.o. gentleman with stage T1c adenocarcinoma of the prostate with Gleason Score of 4+3, and a PSA of 7.72.  NARRATIVE:  The patient was brought to the [facility].  Identity was confirmed.  All relevant records and images related to the planned course of therapy were reviewed.  The patient freely provided informed written consent to proceed with treatment after reviewing the details related to the planned course of therapy. The consent form was witnessed and verified by the simulation staff.  Then, the patient was set-up in a stable reproducible supine position for radiation therapy.  A vacuum lock pillow device was custom fabricated to position his legs in a reproducible immobilized position.  Then, I performed a urethrogram under sterile conditions to identify the prostatic apex.  CT images were obtained.  Surface markings were placed.  The CT images were loaded into the planning software.  Then the prostate target and avoidance structures including the rectum, bladder, bowel and hips were contoured.  Treatment planning then occurred.  The radiation prescription was entered and confirmed.  A total of one complex treatment devices was fabricated. I have requested : Intensity Modulated Radiotherapy (IMRT) is medically necessary for this case for the following reason:  Rectal sparing.Marland Kitchen  PLAN:  The patient will receive 70 Gy in 28 fractions.  ________________________________  Sheral Apley Tammi Klippel, M.D.  This document serves as a record of services personally performed by Willie Pita MD. It was created on his behalf by Delton Coombes, a trained medical scribe. The creation of this record is based on the scribe's personal  observations and the provider's statements to them.

## 2018-07-28 ENCOUNTER — Ambulatory Visit: Payer: Medicare HMO

## 2018-07-29 ENCOUNTER — Ambulatory Visit (HOSPITAL_COMMUNITY): Payer: Medicare HMO

## 2018-07-30 ENCOUNTER — Ambulatory Visit (HOSPITAL_COMMUNITY)
Admission: RE | Admit: 2018-07-30 | Discharge: 2018-07-30 | Disposition: A | Discharge status: Home / Self Care | Payer: Medicare HMO | Source: Ambulatory Visit | Attending: Urology | Admitting: Urology

## 2018-07-30 DIAGNOSIS — C61 Malignant neoplasm of prostate: Secondary | ICD-10-CM | POA: Diagnosis not present

## 2018-07-31 DIAGNOSIS — Z510 Encounter for antineoplastic radiation therapy: Secondary | ICD-10-CM | POA: Diagnosis not present

## 2018-07-31 DIAGNOSIS — C61 Malignant neoplasm of prostate: Secondary | ICD-10-CM | POA: Diagnosis not present

## 2018-08-04 ENCOUNTER — Ambulatory Visit: Payer: Medicare HMO

## 2018-08-04 ENCOUNTER — Ambulatory Visit
Admission: RE | Admit: 2018-08-04 | Discharge: 2018-08-04 | Disposition: A | Discharge status: Home / Self Care | Payer: Medicare HMO | Source: Ambulatory Visit | Attending: Radiation Oncology | Admitting: Radiation Oncology

## 2018-08-04 ENCOUNTER — Encounter: Payer: Self-pay | Admitting: Medical Oncology

## 2018-08-04 DIAGNOSIS — Z510 Encounter for antineoplastic radiation therapy: Secondary | ICD-10-CM | POA: Diagnosis not present

## 2018-08-04 DIAGNOSIS — C61 Malignant neoplasm of prostate: Secondary | ICD-10-CM | POA: Diagnosis not present

## 2018-08-05 ENCOUNTER — Ambulatory Visit
Admission: RE | Admit: 2018-08-05 | Discharge: 2018-08-05 | Disposition: A | Discharge status: Home / Self Care | Payer: Medicare HMO | Source: Ambulatory Visit | Attending: Radiation Oncology | Admitting: Radiation Oncology

## 2018-08-05 ENCOUNTER — Ambulatory Visit: Payer: Medicare HMO

## 2018-08-05 DIAGNOSIS — C61 Malignant neoplasm of prostate: Secondary | ICD-10-CM | POA: Diagnosis not present

## 2018-08-05 DIAGNOSIS — Z510 Encounter for antineoplastic radiation therapy: Secondary | ICD-10-CM | POA: Diagnosis not present

## 2018-08-06 ENCOUNTER — Ambulatory Visit
Admission: RE | Admit: 2018-08-06 | Discharge: 2018-08-06 | Disposition: A | Discharge status: Home / Self Care | Payer: Medicare HMO | Source: Ambulatory Visit | Attending: Radiation Oncology | Admitting: Radiation Oncology

## 2018-08-06 DIAGNOSIS — C61 Malignant neoplasm of prostate: Secondary | ICD-10-CM | POA: Diagnosis not present

## 2018-08-06 DIAGNOSIS — Z510 Encounter for antineoplastic radiation therapy: Secondary | ICD-10-CM | POA: Diagnosis not present

## 2018-08-07 ENCOUNTER — Ambulatory Visit
Admission: RE | Admit: 2018-08-07 | Discharge: 2018-08-07 | Disposition: A | Discharge status: Home / Self Care | Payer: Medicare HMO | Source: Ambulatory Visit | Attending: Radiation Oncology | Admitting: Radiation Oncology

## 2018-08-07 DIAGNOSIS — C61 Malignant neoplasm of prostate: Secondary | ICD-10-CM | POA: Diagnosis not present

## 2018-08-07 DIAGNOSIS — Z510 Encounter for antineoplastic radiation therapy: Secondary | ICD-10-CM | POA: Diagnosis not present

## 2018-08-09 ENCOUNTER — Ambulatory Visit: Admission: RE | Admit: 2018-08-09 | Payer: Medicare HMO | Source: Ambulatory Visit

## 2018-08-10 ENCOUNTER — Ambulatory Visit
Admission: RE | Admit: 2018-08-10 | Discharge: 2018-08-10 | Disposition: A | Discharge status: Home / Self Care | Payer: Medicare HMO | Source: Ambulatory Visit | Attending: Radiation Oncology | Admitting: Radiation Oncology

## 2018-08-10 DIAGNOSIS — C61 Malignant neoplasm of prostate: Secondary | ICD-10-CM | POA: Diagnosis not present

## 2018-08-10 DIAGNOSIS — Z510 Encounter for antineoplastic radiation therapy: Secondary | ICD-10-CM | POA: Diagnosis not present

## 2018-08-11 ENCOUNTER — Ambulatory Visit
Admission: RE | Admit: 2018-08-11 | Discharge: 2018-08-11 | Disposition: A | Discharge status: Home / Self Care | Payer: Medicare HMO | Source: Ambulatory Visit | Attending: Radiation Oncology | Admitting: Radiation Oncology

## 2018-08-11 DIAGNOSIS — Z510 Encounter for antineoplastic radiation therapy: Secondary | ICD-10-CM | POA: Diagnosis not present

## 2018-08-11 DIAGNOSIS — C61 Malignant neoplasm of prostate: Secondary | ICD-10-CM | POA: Diagnosis not present

## 2018-08-12 ENCOUNTER — Ambulatory Visit
Admission: RE | Admit: 2018-08-12 | Discharge: 2018-08-12 | Disposition: A | Discharge status: Home / Self Care | Payer: Medicare HMO | Source: Ambulatory Visit | Attending: Radiation Oncology | Admitting: Radiation Oncology

## 2018-08-12 DIAGNOSIS — Z510 Encounter for antineoplastic radiation therapy: Secondary | ICD-10-CM | POA: Diagnosis not present

## 2018-08-12 DIAGNOSIS — C61 Malignant neoplasm of prostate: Secondary | ICD-10-CM | POA: Diagnosis not present

## 2018-08-13 ENCOUNTER — Ambulatory Visit
Admission: RE | Admit: 2018-08-13 | Discharge: 2018-08-13 | Disposition: A | Discharge status: Home / Self Care | Payer: Medicare HMO | Source: Ambulatory Visit | Attending: Radiation Oncology | Admitting: Radiation Oncology

## 2018-08-13 DIAGNOSIS — Z510 Encounter for antineoplastic radiation therapy: Secondary | ICD-10-CM | POA: Diagnosis not present

## 2018-08-13 DIAGNOSIS — C61 Malignant neoplasm of prostate: Secondary | ICD-10-CM | POA: Diagnosis not present

## 2018-08-14 ENCOUNTER — Ambulatory Visit
Admission: RE | Admit: 2018-08-14 | Discharge: 2018-08-14 | Disposition: A | Discharge status: Home / Self Care | Payer: Medicare HMO | Source: Ambulatory Visit | Attending: Radiation Oncology | Admitting: Radiation Oncology

## 2018-08-14 DIAGNOSIS — C61 Malignant neoplasm of prostate: Secondary | ICD-10-CM | POA: Diagnosis not present

## 2018-08-14 DIAGNOSIS — Z510 Encounter for antineoplastic radiation therapy: Secondary | ICD-10-CM | POA: Diagnosis not present

## 2018-08-17 ENCOUNTER — Ambulatory Visit
Admission: RE | Admit: 2018-08-17 | Discharge: 2018-08-17 | Disposition: A | Discharge status: Home / Self Care | Payer: Medicare HMO | Source: Ambulatory Visit | Attending: Radiation Oncology | Admitting: Radiation Oncology

## 2018-08-17 DIAGNOSIS — C61 Malignant neoplasm of prostate: Secondary | ICD-10-CM | POA: Diagnosis not present

## 2018-08-17 DIAGNOSIS — Z510 Encounter for antineoplastic radiation therapy: Secondary | ICD-10-CM | POA: Diagnosis not present

## 2018-08-18 ENCOUNTER — Ambulatory Visit
Admission: RE | Admit: 2018-08-18 | Discharge: 2018-08-18 | Disposition: A | Discharge status: Home / Self Care | Payer: Medicare HMO | Source: Ambulatory Visit | Attending: Radiation Oncology | Admitting: Radiation Oncology

## 2018-08-18 DIAGNOSIS — Z510 Encounter for antineoplastic radiation therapy: Secondary | ICD-10-CM | POA: Diagnosis not present

## 2018-08-18 DIAGNOSIS — C61 Malignant neoplasm of prostate: Secondary | ICD-10-CM | POA: Diagnosis not present

## 2018-08-19 ENCOUNTER — Ambulatory Visit
Admission: RE | Admit: 2018-08-19 | Discharge: 2018-08-19 | Disposition: A | Discharge status: Home / Self Care | Payer: Medicare HMO | Source: Ambulatory Visit | Attending: Radiation Oncology | Admitting: Radiation Oncology

## 2018-08-19 DIAGNOSIS — Z510 Encounter for antineoplastic radiation therapy: Secondary | ICD-10-CM | POA: Diagnosis not present

## 2018-08-19 DIAGNOSIS — C61 Malignant neoplasm of prostate: Secondary | ICD-10-CM | POA: Diagnosis not present

## 2018-08-20 ENCOUNTER — Ambulatory Visit
Admission: RE | Admit: 2018-08-20 | Discharge: 2018-08-20 | Disposition: A | Discharge status: Home / Self Care | Payer: Medicare HMO | Source: Ambulatory Visit | Attending: Radiation Oncology | Admitting: Radiation Oncology

## 2018-08-20 DIAGNOSIS — Z510 Encounter for antineoplastic radiation therapy: Secondary | ICD-10-CM | POA: Diagnosis not present

## 2018-08-20 DIAGNOSIS — C61 Malignant neoplasm of prostate: Secondary | ICD-10-CM | POA: Diagnosis not present

## 2018-08-21 ENCOUNTER — Ambulatory Visit
Admission: RE | Admit: 2018-08-21 | Discharge: 2018-08-21 | Disposition: A | Discharge status: Home / Self Care | Payer: Medicare HMO | Source: Ambulatory Visit | Attending: Radiation Oncology | Admitting: Radiation Oncology

## 2018-08-21 DIAGNOSIS — C61 Malignant neoplasm of prostate: Secondary | ICD-10-CM | POA: Diagnosis not present

## 2018-08-21 DIAGNOSIS — Z510 Encounter for antineoplastic radiation therapy: Secondary | ICD-10-CM | POA: Diagnosis not present

## 2018-08-25 ENCOUNTER — Ambulatory Visit
Admission: RE | Admit: 2018-08-25 | Discharge: 2018-08-25 | Disposition: A | Discharge status: Home / Self Care | Payer: Medicare HMO | Source: Ambulatory Visit | Attending: Radiation Oncology | Admitting: Radiation Oncology

## 2018-08-25 DIAGNOSIS — C61 Malignant neoplasm of prostate: Secondary | ICD-10-CM | POA: Diagnosis not present

## 2018-08-25 DIAGNOSIS — Z510 Encounter for antineoplastic radiation therapy: Secondary | ICD-10-CM | POA: Diagnosis not present

## 2018-08-26 ENCOUNTER — Ambulatory Visit
Admission: RE | Admit: 2018-08-26 | Discharge: 2018-08-26 | Disposition: A | Discharge status: Home / Self Care | Payer: Medicare HMO | Source: Ambulatory Visit | Attending: Radiation Oncology | Admitting: Radiation Oncology

## 2018-08-26 DIAGNOSIS — C61 Malignant neoplasm of prostate: Secondary | ICD-10-CM | POA: Diagnosis not present

## 2018-08-26 DIAGNOSIS — Z510 Encounter for antineoplastic radiation therapy: Secondary | ICD-10-CM | POA: Diagnosis not present

## 2018-08-27 ENCOUNTER — Ambulatory Visit
Admission: RE | Admit: 2018-08-27 | Discharge: 2018-08-27 | Disposition: A | Discharge status: Home / Self Care | Payer: Medicare HMO | Source: Ambulatory Visit | Attending: Radiation Oncology | Admitting: Radiation Oncology

## 2018-08-27 DIAGNOSIS — C61 Malignant neoplasm of prostate: Secondary | ICD-10-CM | POA: Diagnosis not present

## 2018-08-27 DIAGNOSIS — Z510 Encounter for antineoplastic radiation therapy: Secondary | ICD-10-CM | POA: Diagnosis not present

## 2018-08-28 ENCOUNTER — Ambulatory Visit
Admission: RE | Admit: 2018-08-28 | Discharge: 2018-08-28 | Disposition: A | Discharge status: Home / Self Care | Payer: Medicare HMO | Source: Ambulatory Visit | Attending: Radiation Oncology | Admitting: Radiation Oncology

## 2018-08-28 DIAGNOSIS — C61 Malignant neoplasm of prostate: Secondary | ICD-10-CM | POA: Diagnosis not present

## 2018-08-28 DIAGNOSIS — Z510 Encounter for antineoplastic radiation therapy: Secondary | ICD-10-CM | POA: Diagnosis not present

## 2018-08-31 ENCOUNTER — Ambulatory Visit
Admission: RE | Admit: 2018-08-31 | Discharge: 2018-08-31 | Disposition: A | Discharge status: Home / Self Care | Payer: Medicare HMO | Source: Ambulatory Visit | Attending: Radiation Oncology | Admitting: Radiation Oncology

## 2018-08-31 DIAGNOSIS — Z510 Encounter for antineoplastic radiation therapy: Secondary | ICD-10-CM | POA: Diagnosis not present

## 2018-08-31 DIAGNOSIS — C61 Malignant neoplasm of prostate: Secondary | ICD-10-CM | POA: Diagnosis not present

## 2018-09-01 ENCOUNTER — Ambulatory Visit
Admission: RE | Admit: 2018-09-01 | Discharge: 2018-09-01 | Disposition: A | Discharge status: Home / Self Care | Payer: Medicare HMO | Source: Ambulatory Visit | Attending: Radiation Oncology | Admitting: Radiation Oncology

## 2018-09-01 DIAGNOSIS — Z510 Encounter for antineoplastic radiation therapy: Secondary | ICD-10-CM | POA: Diagnosis not present

## 2018-09-01 DIAGNOSIS — C61 Malignant neoplasm of prostate: Secondary | ICD-10-CM | POA: Diagnosis not present

## 2018-09-02 ENCOUNTER — Ambulatory Visit
Admission: RE | Admit: 2018-09-02 | Discharge: 2018-09-02 | Disposition: A | Discharge status: Home / Self Care | Payer: Medicare HMO | Source: Ambulatory Visit | Attending: Radiation Oncology | Admitting: Radiation Oncology

## 2018-09-02 DIAGNOSIS — Z510 Encounter for antineoplastic radiation therapy: Secondary | ICD-10-CM | POA: Diagnosis not present

## 2018-09-02 DIAGNOSIS — C61 Malignant neoplasm of prostate: Secondary | ICD-10-CM | POA: Diagnosis not present

## 2018-09-03 ENCOUNTER — Ambulatory Visit
Admission: RE | Admit: 2018-09-03 | Discharge: 2018-09-03 | Disposition: A | Discharge status: Home / Self Care | Payer: Medicare HMO | Source: Ambulatory Visit | Attending: Radiation Oncology | Admitting: Radiation Oncology

## 2018-09-03 DIAGNOSIS — C61 Malignant neoplasm of prostate: Secondary | ICD-10-CM | POA: Diagnosis not present

## 2018-09-03 DIAGNOSIS — Z510 Encounter for antineoplastic radiation therapy: Secondary | ICD-10-CM | POA: Diagnosis not present

## 2018-09-04 ENCOUNTER — Ambulatory Visit
Admission: RE | Admit: 2018-09-04 | Discharge: 2018-09-04 | Disposition: A | Discharge status: Home / Self Care | Payer: Medicare HMO | Source: Ambulatory Visit | Attending: Radiation Oncology | Admitting: Radiation Oncology

## 2018-09-04 DIAGNOSIS — C61 Malignant neoplasm of prostate: Secondary | ICD-10-CM | POA: Diagnosis not present

## 2018-09-04 DIAGNOSIS — Z510 Encounter for antineoplastic radiation therapy: Secondary | ICD-10-CM | POA: Diagnosis not present

## 2018-09-07 ENCOUNTER — Ambulatory Visit
Admission: RE | Admit: 2018-09-07 | Discharge: 2018-09-07 | Disposition: A | Discharge status: Home / Self Care | Payer: Medicare HMO | Source: Ambulatory Visit | Attending: Radiation Oncology | Admitting: Radiation Oncology

## 2018-09-07 DIAGNOSIS — Z510 Encounter for antineoplastic radiation therapy: Secondary | ICD-10-CM | POA: Diagnosis not present

## 2018-09-07 DIAGNOSIS — C61 Malignant neoplasm of prostate: Secondary | ICD-10-CM | POA: Diagnosis not present

## 2018-09-08 ENCOUNTER — Ambulatory Visit
Admission: RE | Admit: 2018-09-08 | Discharge: 2018-09-08 | Disposition: A | Discharge status: Home / Self Care | Payer: Medicare HMO | Source: Ambulatory Visit | Attending: Radiation Oncology | Admitting: Radiation Oncology

## 2018-09-08 DIAGNOSIS — Z510 Encounter for antineoplastic radiation therapy: Secondary | ICD-10-CM | POA: Diagnosis not present

## 2018-09-08 DIAGNOSIS — C61 Malignant neoplasm of prostate: Secondary | ICD-10-CM | POA: Diagnosis not present

## 2018-09-09 ENCOUNTER — Ambulatory Visit
Admission: RE | Admit: 2018-09-09 | Discharge: 2018-09-09 | Disposition: A | Discharge status: Home / Self Care | Payer: Medicare HMO | Source: Ambulatory Visit | Attending: Radiation Oncology | Admitting: Radiation Oncology

## 2018-09-09 DIAGNOSIS — Z510 Encounter for antineoplastic radiation therapy: Secondary | ICD-10-CM | POA: Diagnosis not present

## 2018-09-09 DIAGNOSIS — C61 Malignant neoplasm of prostate: Secondary | ICD-10-CM | POA: Diagnosis not present

## 2018-09-10 ENCOUNTER — Ambulatory Visit
Admission: RE | Admit: 2018-09-10 | Discharge: 2018-09-10 | Disposition: A | Discharge status: Home / Self Care | Payer: Medicare HMO | Source: Ambulatory Visit | Attending: Radiation Oncology | Admitting: Radiation Oncology

## 2018-09-10 DIAGNOSIS — C61 Malignant neoplasm of prostate: Secondary | ICD-10-CM | POA: Diagnosis not present

## 2018-09-10 DIAGNOSIS — Z510 Encounter for antineoplastic radiation therapy: Secondary | ICD-10-CM | POA: Diagnosis not present

## 2018-09-11 ENCOUNTER — Ambulatory Visit
Admission: RE | Admit: 2018-09-11 | Discharge: 2018-09-11 | Disposition: A | Discharge status: Home / Self Care | Payer: Medicare HMO | Source: Ambulatory Visit | Attending: Radiation Oncology | Admitting: Radiation Oncology

## 2018-09-11 ENCOUNTER — Encounter: Payer: Self-pay | Admitting: Medical Oncology

## 2018-09-11 ENCOUNTER — Encounter: Payer: Self-pay | Admitting: Radiation Oncology

## 2018-09-11 DIAGNOSIS — Z510 Encounter for antineoplastic radiation therapy: Secondary | ICD-10-CM | POA: Diagnosis not present

## 2018-09-11 DIAGNOSIS — C61 Malignant neoplasm of prostate: Secondary | ICD-10-CM | POA: Diagnosis not present

## 2018-09-24 NOTE — Progress Notes (Signed)
  Radiation Oncology         [phone] ________________________________  Name: Willie Parks MRN: [id number]  Date: 09/11/2018  DOB: 10-09-1946  End of Treatment Note  Diagnosis:   72 y.o. male with stage T1c adenocarcinoma of the prostate with Gleason Score of 4+3, and a PSA of 7.72    Indication for treatment:  Curative, Definitive Radiotherapy       Radiation treatment dates:   08/04/2018 - 09/11/2018  Site/dose:   The prostate was treated to 70 Gy in 28 fractions of 2.5 Gy  Beams/energy:   The patient was treated with IMRT using volumetric arc therapy delivering 6 MV X-rays to clockwise and counterclockwise circumferential arcs with a 90 degree collimator offset to avoid dose scalloping.  Image guidance was performed with daily cone beam CT prior to each fraction to align to gold markers in the prostate and assure proper bladder and rectal fill volumes.  Immobilization was achieved with BodyFix custom mold.  Narrative: The patient tolerated radiation treatment relatively well.   The patient experienced modest fatigue and some minor urinary irritation with urgency, leakage, intermittency, incomplete emptying, nocturia x3-4, and dysuria. He denied any hematuria. He had occasional constipation and also noted left flank pain which spontaneously resolved prior to completion of treatment.  Plan: The patient has completed radiation treatment. He will return to radiation oncology clinic for routine followup in one month. I advised him to call or return sooner if he has any questions or concerns related to his recovery or treatment. ________________________________  Sheral Apley. Tammi Klippel, M.D.  This document serves as a record of services personally performed by Tyler Pita, MD. It was created on his behalf by Rae Lips, a trained medical scribe. The creation of this record is based on the scribe's personal observations and the provider's statements to them. This document has been checked  and approved by the attending provider.

## 2018-09-28 ENCOUNTER — Telehealth: Payer: Self-pay

## 2018-09-28 NOTE — Telephone Encounter (Signed)
Spoke with Ms Hardage in regards pt. Pt was having an issue with constipation. Constipation issue has now been resolved per Ms [place]. Advised to have pt states hydrated and eat fiber for bowel regulation. Advised to try prunes or apples/apple sauce. Advised if there were any further issues please do not hesitate to call.

## 2018-10-06 ENCOUNTER — Ambulatory Visit: Payer: Self-pay | Admitting: Urology

## 2018-10-08 ENCOUNTER — Other Ambulatory Visit: Payer: Self-pay

## 2018-10-08 ENCOUNTER — Ambulatory Visit
Admission: RE | Admit: 2018-10-08 | Discharge: 2018-10-08 | Disposition: A | Discharge status: Home / Self Care | Payer: Medicare HMO | Source: Ambulatory Visit | Attending: Urology | Admitting: Urology

## 2018-10-08 ENCOUNTER — Encounter: Payer: Self-pay | Admitting: Urology

## 2018-10-08 VITALS — BP 126/77 | HR 74 | Temp 97.9°F | Resp 20 | Ht 72.0 in | Wt 183.6 lb

## 2018-10-08 DIAGNOSIS — K5900 Constipation, unspecified: Secondary | ICD-10-CM | POA: Insufficient documentation

## 2018-10-08 DIAGNOSIS — Z923 Personal history of irradiation: Secondary | ICD-10-CM | POA: Insufficient documentation

## 2018-10-08 DIAGNOSIS — Z79899 Other long term (current) drug therapy: Secondary | ICD-10-CM | POA: Diagnosis not present

## 2018-10-08 DIAGNOSIS — Z7982 Long term (current) use of aspirin: Secondary | ICD-10-CM | POA: Insufficient documentation

## 2018-10-08 DIAGNOSIS — C61 Malignant neoplasm of prostate: Secondary | ICD-10-CM | POA: Insufficient documentation

## 2018-10-08 DIAGNOSIS — Z880 Allergy status to penicillin: Secondary | ICD-10-CM | POA: Diagnosis not present

## 2018-10-08 NOTE — Progress Notes (Signed)
Radiation Oncology         [phone] ________________________________  Name: Oluwatomiwa Kinyon MRN: [id number]  Date: 10/08/2018  DOB: August 27, 1946  Post Treatment Note  CC: Martinique, Betty G, MD  Irine Seal, MD  Diagnosis:   72 y.o. male with stage T1c adenocarcinoma of the prostate with Gleason Score of 4+3, and a PSA of 7.72    Interval Since Last Radiation:  4 weeks  08/04/2018 - 09/11/2018:  The prostate was treated to 70 Gy in 28 fractions of 2.5 Gy  Narrative:  The patient returns today for routine follow-up.  He tolerated radiation treatment relatively well.   The patient experienced modest fatigue and some minor urinary irritation with urgency, leakage, intermittency, incomplete emptying, nocturia x3-4, and dysuria. He denied any hematuria. He had occasional constipation and also noted left flank pain.                              On review of systems, the patient states that he is doing very well overall.  He continues with a gradual improvement in his lower urinary tract symptoms with a current IPSS score of 16 indicating moderate symptoms.  He continues with a mildly weak stream and intermittency but overall feels that he is emptying his bladder well on voiding.  He specifically denies dysuria, gross hematuria, excessive daytime frequency, urgency or incontinence.  He reports nocturia x2 per night which is manageable.  He also continues with mild residual fatigue but again feels that this is gradually improving.  He denies abdominal pain, nausea, vomiting, diarrhea or constipation.  He has a healthy appetite and is maintaining his weight.  ALLERGIES:  is allergic to penicillins.  Meds: Current Outpatient Medications  Medication Sig Dispense Refill  . ADVAIR DISKUS 250-50 MCG/DOSE AEPB INHALE 1 PUFF BY MOUTH INTO LUNGS TWICE DAILY 60 each 5  . aspirin EC 81 MG tablet Take 81 mg by mouth daily.    Marland Kitchen atorvastatin (LIPITOR) 20 MG tablet Take 1 tablet (20 mg total) by mouth daily. 90  tablet 3  . Calcium Carbonate-Vitamin D (CALCARB 600/D PO) Take by mouth daily.    . diazepam (VALIUM) 10 MG tablet   0  . doxazosin (CARDURA) 1 MG tablet Take 1 tablet (1 mg total) by mouth at bedtime. (Patient not taking: Reported on 10/08/2018) 90 tablet 3   No current facility-administered medications for this encounter.     Physical Findings:  height is 6' (1.829 m) and weight is 183 lb 9.6 oz (83.3 kg). His oral temperature is 97.9 F (36.6 C). His blood pressure is 126/77 and his pulse is 74. His respiration is 20 and oxygen saturation is 100%.  Pain Assessment Pain Score: 4  Pain Frequency: Constant Pain Loc: (left side)/10 In general this is a well appearing African-American male in no acute distress.  He's alert and oriented x4 and appropriate throughout the examination. Cardiopulmonary assessment is negative for acute distress and he exhibits normal effort.   Lab Findings: No results found for: WBC, HGB, HCT, MCV, PLT   Radiographic Findings: No results found.  Impression/Plan: 1. 72 y.o. male with stage T1c adenocarcinoma of the prostate with Gleason Score of 4+3, and a PSA of 7.72.   He will continue to follow up with urology for ongoing PSA determinations and has an appointment scheduled with Dr. Jeffie Pollock on 12/09/18. He understands what to expect with regards to PSA monitoring going forward. I  will look forward to following his response to treatment via correspondence with urology, and would be happy to continue to participate in his care if clinically indicated. I talked to the patient about what to expect in the future, including his risk for erectile dysfunction and rectal bleeding. I encouraged him to call or return to the office if he has any questions regarding his previous radiation or possible radiation side effects. He was comfortable with this plan and will follow up as needed.     Nicholos Johns, PA-C

## 2018-11-04 ENCOUNTER — Ambulatory Visit (INDEPENDENT_AMBULATORY_CARE_PROVIDER_SITE_OTHER): Payer: Medicare HMO | Admitting: Family Medicine

## 2018-11-04 ENCOUNTER — Encounter: Payer: Self-pay | Admitting: Family Medicine

## 2018-11-04 VITALS — BP 126/78 | HR 70 | Temp 97.7°F | Resp 16 | Ht 72.0 in | Wt 179.5 lb

## 2018-11-04 DIAGNOSIS — J4520 Mild intermittent asthma, uncomplicated: Secondary | ICD-10-CM

## 2018-11-04 DIAGNOSIS — J988 Other specified respiratory disorders: Secondary | ICD-10-CM

## 2018-11-04 MED ORDER — ALBUTEROL SULFATE HFA 108 (90 BASE) MCG/ACT IN AERS: 2.0000 | INHALATION_SPRAY | Freq: Four times a day (QID) | RESPIRATORY_TRACT | 2 refills | Status: DC | PRN

## 2018-11-04 MED ORDER — DOXYCYCLINE HYCLATE 100 MG PO TABS: 100.0000 mg | ORAL_TABLET | Freq: Two times a day (BID) | ORAL | 0 refills | Status: AC

## 2018-11-04 MED FILL — ATORVASTATIN CALCIUM 20 MG: 20 | 90 days supply | Qty: 90 | Fill #1

## 2018-11-04 MED FILL — VENTOLIN HFA 90 MCG INHALER: 108 (90 BAS | 25 days supply | Qty: 18 | Fill #0

## 2018-11-04 MED FILL — DOXYCYCLINE HYCLATE 100 MG: 100 | 7 days supply | Qty: 14 | Fill #0

## 2018-11-04 MED FILL — ADVAIR 250/50 DISKUS: 250-50 | 30 days supply | Qty: 60 | Fill #1

## 2018-11-04 NOTE — Progress Notes (Signed)
ACUTE VISIT  HPI:  Chief Complaint  Patient presents with  . Cough    for 3 weeks with green/white phlem    [name] is a 72 y.o.male here today complaining of 3 weeks of respiratory symptoms. Productive cough with greenish sputum. +Wheezing. He has not noted SOB or chest pain. Last night subjective fever. + Chills an body aches.   Hx of asthma, he is on Advair 250-50 mcg bid.  He has not used Albuterol inh.  Cough  This is a new problem. The current episode started 1 to 4 weeks ago. The problem has been unchanged. The cough is productive of sputum. Associated symptoms include chills, a fever, myalgias, rhinorrhea and wheezing. Pertinent negatives include no ear congestion, eye redness, headaches, heartburn, hemoptysis, nasal congestion, postnasal drip, rash, sore throat or shortness of breath. The symptoms are aggravated by exercise. He has tried steroid inhaler and a beta-agonist inhaler for the symptoms. The treatment provided moderate relief. His past medical history is significant for asthma and environmental allergies. There is no history of COPD.   No Hx of recent travel. No sick contact. No known insect bite.   OTC medications for this problem: None  Hx of prostate cancer, radiation therapy.    Review of Systems  Constitutional: Positive for appetite change, chills, fatigue and fever. Negative for activity change.  HENT: Positive for rhinorrhea. Negative for congestion, mouth sores, postnasal drip, sore throat and voice change.   Eyes: Negative for discharge and redness.  Respiratory: Positive for cough and wheezing. Negative for hemoptysis, chest tightness and shortness of breath.   Gastrointestinal: Negative for abdominal pain, diarrhea, heartburn, nausea and vomiting.  Genitourinary: Negative for decreased urine volume and dysuria.  Musculoskeletal: Positive for myalgias. Negative for gait problem and joint swelling.  Skin: Negative for rash.   Allergic/Immunologic: Positive for environmental allergies.  Neurological: Negative for weakness and headaches.  Psychiatric/Behavioral: Negative for confusion.      Current Outpatient Medications on File Prior to Visit  Medication Sig Dispense Refill  . ADVAIR DISKUS 250-50 MCG/DOSE AEPB INHALE 1 PUFF BY MOUTH INTO LUNGS TWICE DAILY 60 each 5  . aspirin EC 81 MG tablet Take 81 mg by mouth daily.    Marland Kitchen atorvastatin (LIPITOR) 20 MG tablet Take 1 tablet (20 mg total) by mouth daily. 90 tablet 3  . Calcium Carbonate-Vitamin D (CALCARB 600/D PO) Take by mouth daily.    . diazepam (VALIUM) 10 MG tablet   0  . doxazosin (CARDURA) 1 MG tablet Take 1 tablet (1 mg total) by mouth at bedtime. 90 tablet 3   No current facility-administered medications on file prior to visit.      Past Medical History:  Diagnosis Date  . Allergy   . Asthma   . Chicken pox   . Hyperlipidemia   . Prostate cancer ([place])    Allergies  Allergen Reactions  . Penicillins Other (See Comments)    Dizziness     Social History   Socioeconomic History  . Marital status: Married    Spouse name: Not on file  . Number of children: Not on file  . Years of education: Not on file  . Highest education level: Not on file  Occupational History  . Occupation: retired  Scientific laboratory technician  . Financial resource strain: Not on file  . Food insecurity:    Worry: Not on file    Inability: Not on file  . Transportation needs:  Medical: Not on file    Non-medical: Not on file  Tobacco Use  . Smoking status: Never Smoker  . Smokeless tobacco: Never Used  Substance and Sexual Activity  . Alcohol use: Yes    Alcohol/week: 1.0 standard drinks    Types: 1 Standard drinks or equivalent per week    Comment: per week  . Drug use: No  . Sexual activity: Not Currently  Lifestyle  . Physical activity:    Days per week: Not on file    Minutes per session: Not on file  . Stress: Not on file  Relationships  . Social  connections:    Talks on phone: Not on file    Gets together: Not on file    Attends religious service: Not on file    Active member of club or organization: Not on file    Attends meetings of clubs or organizations: Not on file    Relationship status: Not on file  Other Topics Concern  . Not on file  Social History Narrative   Second marriage. Resides with wife in [place]. Retired. Has one grown child. 15 grandchildren to include his step grandchildren.    10-08-18 Unable to ask abuse questions wife with him today.    Vitals:   11/04/18 1134  BP: 126/78  Pulse: 70  Resp: 16  Temp: 97.7 F (36.5 C)  SpO2: 99%   Body mass index is 24.34 kg/m.    Physical Exam  Nursing note and vitals reviewed. Constitutional: He is oriented to person, place, and time. He appears well-developed and well-nourished. He does not appear ill. No distress.  HENT:  Head: Normocephalic and atraumatic.  Right Ear: Tympanic membrane, external ear and ear canal normal.  Left Ear: Tympanic membrane, external ear and ear canal normal.  Nose: Right sinus exhibits no maxillary sinus tenderness and no frontal sinus tenderness. Left sinus exhibits no maxillary sinus tenderness and no frontal sinus tenderness.  Mouth/Throat: Oropharynx is clear and moist and mucous membranes are normal.  Eyes: Conjunctivae and EOM are normal.  Cardiovascular: Normal rate and regular rhythm.  No murmur heard. Respiratory: Effort normal. No stridor. No respiratory distress. He has wheezes (sporadic). He has no rhonchi. He has no rales.  Lymphadenopathy:       Head (right side): No submandibular adenopathy present.       Head (left side): No submandibular adenopathy present.    He has no cervical adenopathy.  Neurological: He is alert and oriented to person, place, and time. He has normal strength.  Skin: Skin is warm. No rash noted. No erythema.  Psychiatric: He has a normal mood and affect. His speech is normal.  Well  groomed, good eye contact.     ASSESSMENT AND PLAN:   Willie Parks was seen today for cough.  Diagnoses and all orders for this visit:  Respiratory tract infection  Because subjective fever last nigh + Hx of prostate cancer and Hx of asthma,abx treatment recommended. Adequate hydration. Plain Mucinex may also help with cough. Instructed about warning signs.  -     doxycycline (VIBRA-TABS) 100 MG tablet; Take 1 tablet (100 mg total) by mouth 2 (two) times daily for 7 days.  Mild intermittent asthma, uncomplicated  Albuterol inh 2 puff every 6 hours for a week then as needed for wheezing or shortness of breath.  Continue Advair 250-50 mcg bid. I do not think oral steroid is nneded at this time. Since abx was recommended I do not think  chest imaging is needed today. Instructed about warning signs.  -     albuterol (PROVENTIL HFA;VENTOLIN HFA) 108 (90 Base) MCG/ACT inhaler; Inhale 2 puffs into the lungs every 6 (six) hours as needed for wheezing or shortness of breath.     Nicholad Kautzman G. Martinique, MD  [facility]. [place] office.

## 2018-11-04 NOTE — Patient Instructions (Signed)
A few things to remember from today's visit:   Respiratory tract infection - Plan: doxycycline (VIBRA-TABS) 100 MG tablet  Mild intermittent asthma, uncomplicated - Plan: albuterol (PROVENTIL HFA;VENTOLIN HFA) 108 (90 Base) MCG/ACT inhaler   Continue Advair twice daily. Albuterol inh 2 puff every 6 hours for a week then as needed for wheezing or shortness of breath.         viral infections are self-limited and we treat each symptom depending of severity.  Over the counter medications as decongestants and cold medications usually help, they need to be taken with caution if there is a history of high blood pressure or palpitations. Tylenol and/or Ibuprofen also helps with most symptoms (headache, muscle aching, fever,etc) Plenty of fluids. Honey helps with cough. Steam inhalations helps with runny nose, nasal congestion, and may prevent sinus infections. Cough and nasal congestion could last a few days and sometimes weeks. Please follow in not any better in 1-2 weeks or if symptoms get worse.

## 2018-11-05 ENCOUNTER — Encounter: Payer: Self-pay | Admitting: Family Medicine

## 2018-12-02 DIAGNOSIS — C61 Malignant neoplasm of prostate: Secondary | ICD-10-CM | POA: Diagnosis not present

## 2018-12-09 DIAGNOSIS — N401 Enlarged prostate with lower urinary tract symptoms: Secondary | ICD-10-CM | POA: Diagnosis not present

## 2018-12-09 DIAGNOSIS — R109 Unspecified abdominal pain: Secondary | ICD-10-CM | POA: Diagnosis not present

## 2018-12-09 DIAGNOSIS — Z8546 Personal history of malignant neoplasm of prostate: Secondary | ICD-10-CM | POA: Diagnosis not present

## 2018-12-09 DIAGNOSIS — R3915 Urgency of urination: Secondary | ICD-10-CM | POA: Diagnosis not present

## 2019-01-22 ENCOUNTER — Ambulatory Visit (INDEPENDENT_AMBULATORY_CARE_PROVIDER_SITE_OTHER): Payer: Medicare HMO | Admitting: *Deleted

## 2019-01-22 DIAGNOSIS — Z23 Encounter for immunization: Secondary | ICD-10-CM | POA: Diagnosis not present

## 2019-03-04 DIAGNOSIS — Z8546 Personal history of malignant neoplasm of prostate: Secondary | ICD-10-CM | POA: Diagnosis not present

## 2019-03-10 DIAGNOSIS — R3915 Urgency of urination: Secondary | ICD-10-CM | POA: Diagnosis not present

## 2019-03-10 DIAGNOSIS — C61 Malignant neoplasm of prostate: Secondary | ICD-10-CM | POA: Diagnosis not present

## 2019-04-06 MED FILL — ATORVASTATIN 20 MG TABLET: 20 | 90 days supply | Qty: 90 | Fill #2

## 2019-06-01 MED FILL — ADVAIR 250/50 DISKUS: 250-50 | 30 days supply | Qty: 60 | Fill #2

## 2019-06-10 DIAGNOSIS — Z8546 Personal history of malignant neoplasm of prostate: Secondary | ICD-10-CM | POA: Diagnosis not present

## 2019-06-16 DIAGNOSIS — Z8546 Personal history of malignant neoplasm of prostate: Secondary | ICD-10-CM | POA: Diagnosis not present

## 2019-06-16 DIAGNOSIS — R3912 Poor urinary stream: Secondary | ICD-10-CM | POA: Diagnosis not present

## 2019-06-16 DIAGNOSIS — N401 Enlarged prostate with lower urinary tract symptoms: Secondary | ICD-10-CM | POA: Diagnosis not present

## 2019-07-27 ENCOUNTER — Other Ambulatory Visit: Payer: Self-pay | Admitting: Family Medicine

## 2019-07-27 DIAGNOSIS — J4520 Mild intermittent asthma, uncomplicated: Secondary | ICD-10-CM

## 2019-08-04 ENCOUNTER — Ambulatory Visit (INDEPENDENT_AMBULATORY_CARE_PROVIDER_SITE_OTHER): Payer: Medicare HMO | Admitting: Family Medicine

## 2019-08-04 ENCOUNTER — Encounter: Payer: Self-pay | Admitting: Family Medicine

## 2019-08-04 ENCOUNTER — Other Ambulatory Visit: Payer: Self-pay

## 2019-08-04 ENCOUNTER — Other Ambulatory Visit: Payer: Self-pay | Admitting: Family Medicine

## 2019-08-04 VITALS — BP 122/74 | HR 62 | Temp 98.9°F | Resp 12 | Ht 72.0 in | Wt 189.4 lb

## 2019-08-04 DIAGNOSIS — J4520 Mild intermittent asthma, uncomplicated: Secondary | ICD-10-CM

## 2019-08-04 DIAGNOSIS — G459 Transient cerebral ischemic attack, unspecified: Secondary | ICD-10-CM

## 2019-08-04 DIAGNOSIS — E785 Hyperlipidemia, unspecified: Secondary | ICD-10-CM

## 2019-08-04 DIAGNOSIS — E559 Vitamin D deficiency, unspecified: Secondary | ICD-10-CM | POA: Diagnosis not present

## 2019-08-04 DIAGNOSIS — I10 Essential (primary) hypertension: Secondary | ICD-10-CM | POA: Diagnosis not present

## 2019-08-04 DIAGNOSIS — E875 Hyperkalemia: Secondary | ICD-10-CM

## 2019-08-04 DIAGNOSIS — C61 Malignant neoplasm of prostate: Secondary | ICD-10-CM

## 2019-08-04 LAB — LIPID PANEL
Cholesterol: 157 mg/dL (ref 0–200)
HDL: 56.8 mg/dL (ref >39.00)
LDL Cholesterol: 82 mg/dL (ref 0–99)
NonHDL: 99.77
Total CHOL/HDL Ratio: 3
Triglycerides: 91 mg/dL (ref 0.0–149.0)
VLDL: 18.2 mg/dL (ref 0.0–40.0)

## 2019-08-04 LAB — COMPREHENSIVE METABOLIC PANEL
ALT: 18 U/L (ref 0–53)
AST: 19 U/L (ref 0–37)
Albumin: 4.7 g/dL (ref 3.5–5.2)
Alkaline Phosphatase: 86 U/L (ref 39–117)
BUN: 8 mg/dL (ref 6–23)
CO2: 28 mEq/L (ref 19–32)
Calcium: 10.4 mg/dL (ref 8.4–10.5)
Chloride: 104 mEq/L (ref 96–112)
Creatinine, Ser: 1.15 mg/dL (ref 0.40–1.50)
GFR: 75.27 mL/min (ref >60.00)
Glucose, Bld: 104 mg/dL — ABNORMAL HIGH (ref 70–99)
Potassium: 5.2 mEq/L — ABNORMAL HIGH (ref 3.5–5.1)
Sodium: 139 mEq/L (ref 135–145)
Total Bilirubin: 1.3 mg/dL — ABNORMAL HIGH (ref 0.2–1.2)
Total Protein: 7.1 g/dL (ref 6.0–8.3)

## 2019-08-04 LAB — VITAMIN D 25 HYDROXY (VIT D DEFICIENCY, FRACTURES): VITD: 30.52 ng/mL (ref 30.00–100.00)

## 2019-08-04 MED FILL — ADVAIR 250/50 DISKUS: 250-50 | 30 days supply | Qty: 60 | Fill #0

## 2019-08-04 NOTE — Progress Notes (Signed)
HPI:   [name] is a 73 y.o. male, who is here today with his wife for chronic disease management. He was last seen on 11/04/18.  Since his last OV he has followed with urologist for prostate cancer.   He needs refill on Advair 250-50 mcg He does not need Albuterol inh very frequent, has not needed in a while. No side effects reported.  He has not noted cough,wheezing,or dyspnea.  HLD, he is on Atorvastatin Atorvastatin 20 mg every other day. Denies severe/frequent headache, visual changes, chest pain, dyspnea, palpitation, claudication, focal weakness, or edema.  Hx of TIA. He takes Aspirin 81 mg daily.  Component     Latest Ref Rng & Units 06/23/2018  Cholesterol     0 - 200 mg/dL 184  Triglycerides     0.0 - 149.0 mg/dL 193.0 (H)  HDL Cholesterol     >39.00 mg/dL 57.70  VLDL     0.0 - 40.0 mg/dL 38.6  LDL (calc)     0 - 99 mg/dL 88  Total CHOL/HDL Ratio      3  NonHDL      126.54   Vit D deficiency, he is on Vit D supplementation 800 U daily. Last 25 OH vit D was 25.5 in 02/2018  Review of Systems  Constitutional: Negative for activity change, appetite change, fatigue and fever.  HENT: Negative for nosebleeds and sore throat.   Eyes: Negative for redness and visual disturbance.  Respiratory: Negative for cough, shortness of breath and wheezing.   Cardiovascular: Negative for chest pain, palpitations and leg swelling.  Gastrointestinal: Negative for abdominal pain, nausea and vomiting.  Skin: Negative for pallor and rash.  Neurological: Negative for syncope, weakness and headaches.  Rest see pertinent positives and negatives per HPI.   Current Outpatient Medications on File Prior to Visit  Medication Sig Dispense Refill  . aspirin EC 81 MG tablet Take 81 mg by mouth daily.    Marland Kitchen atorvastatin (LIPITOR) 20 MG tablet Take 1 tablet (20 mg total) by mouth daily. 90 tablet 3  . Calcium Carbonate-Vitamin D (CALCARB 600/D PO) Take by mouth daily.      No current facility-administered medications on file prior to visit.      Past Medical History:  Diagnosis Date  . Allergy   . Asthma   . Chicken pox   . Hyperlipidemia   . Prostate cancer ([place])    Allergies  Allergen Reactions  . Penicillins Other (See Comments)    Dizziness     Social History   Socioeconomic History  . Marital status: Married    Spouse name: Not on file  . Number of children: Not on file  . Years of education: Not on file  . Highest education level: Not on file  Occupational History  . Occupation: retired  Scientific laboratory technician  . Financial resource strain: Not on file  . Food insecurity    Worry: Not on file    Inability: Not on file  . Transportation needs    Medical: Not on file    Non-medical: Not on file  Tobacco Use  . Smoking status: Never Smoker  . Smokeless tobacco: Never Used  Substance and Sexual Activity  . Alcohol use: Yes    Alcohol/week: 1.0 standard drinks    Types: 1 Standard drinks or equivalent per week    Comment: per week  . Drug use: No  . Sexual activity: Not Currently  Lifestyle  . Physical  activity    Days per week: Not on file    Minutes per session: Not on file  . Stress: Not on file  Relationships  . Social Herbalist on phone: Not on file    Gets together: Not on file    Attends religious service: Not on file    Active member of club or organization: Not on file    Attends meetings of clubs or organizations: Not on file    Relationship status: Not on file  Other Topics Concern  . Not on file  Social History Narrative   Second marriage. Resides with wife in [place]. Retired. Has one grown child. 15 grandchildren to include his step grandchildren.    10-08-18 Unable to ask abuse questions wife with him today.    Vitals:   08/04/19 1024  BP: 122/74  Pulse: 62  Resp: 12  Temp: 98.9 F (37.2 C)  SpO2: 100%   Body mass index is 25.68 kg/m.   Physical Exam  Nursing note and vitals reviewed.  Constitutional: He is oriented to person, place, and time. He appears well-developed and well-nourished. No distress.  HENT:  Head: Normocephalic and atraumatic.  Mouth/Throat: Oropharynx is clear and moist and mucous membranes are normal.  Eyes: Pupils are equal, round, and reactive to light. Conjunctivae are normal.  Cardiovascular: Normal rate and regular rhythm.  No murmur heard. Pulses:      Posterior tibial pulses are 2+ on the right side and 2+ on the left side.  Respiratory: Effort normal and breath sounds normal. No respiratory distress.  GI: Soft. He exhibits no mass. There is no hepatomegaly. There is no abdominal tenderness.  Musculoskeletal:        General: No edema.  Lymphadenopathy:    He has no cervical adenopathy.  Neurological: He is alert and oriented to person, place, and time. He has normal strength. No cranial nerve deficit. Gait normal.  Skin: Skin is warm. No rash noted. No erythema.  Psychiatric: He has a normal mood and affect.  Well groomed, good eye contact.    ASSESSMENT AND PLAN:  [name] was seen today for follow-up.  Diagnoses and all orders for this visit: Lab Results  Component Value Date   CHOL 157 08/04/2019   HDL 56.80 08/04/2019   LDLCALC 82 08/04/2019   LDLDIRECT 115.0 06/18/2017   TRIG 91.0 08/04/2019   CHOLHDL 3 08/04/2019   Lab Results  Component Value Date   CREATININE 1.15 08/04/2019   BUN 8 08/04/2019   NA 139 08/04/2019   K 5.2 No hemolysis seen (H) 08/04/2019   CL 104 08/04/2019   CO2 28 08/04/2019   Lab Results  Component Value Date   ALT 18 08/04/2019   AST 19 08/04/2019   ALKPHOS 86 08/04/2019   BILITOT 1.3 (H) 08/04/2019    Hyperlipidemia, unspecified hyperlipidemia type On Atorvastatin 20 mg daily. No changes in current management, will follow labs done today and will give further recommendations accordingly.  -     Comprehensive metabolic panel -     Lipid panel -     VITAMIN D 25 Hydroxy (Vit-D  Deficiency, Fractures)  Mild intermittent asthma, uncomplicated Problem is well controlled. No changes in current management. F/U in a year.  Essential hypertension Continue non pharmacologic treatment. Recommend monitoring BP at home. Continue low salt diet.  -     Comprehensive metabolic panel  Vitamin D deficiency No changes in current management, will follow 25 OH vit  D done today and will give further recommendations accordingly.  -     VITAMIN D 25 Hydroxy (Vit-D Deficiency, Fractures)  Malignant neoplasm of prostate [facility]) Following with urologist.   Return in 1 year (on 08/03/2020) for due for medicare.    -[name] was advised to return sooner than planned today if new concerns arise.       Jevaun Strick G. Martinique, MD  [facility]. [place] office.

## 2019-08-04 NOTE — Patient Instructions (Signed)
A few things to remember from today's visit:   Hyperlipidemia, unspecified hyperlipidemia type - Plan: Comprehensive metabolic panel, Lipid panel, VITAMIN D 25 Hydroxy (Vit-D Deficiency, Fractures)  Mild intermittent asthma, uncomplicated  Essential hypertension - Plan: Comprehensive metabolic panel  Vitamin D deficiency - Plan: VITAMIN D 25 Hydroxy (Vit-D Deficiency, Fractures)  No chnages today. Please be sure medication list is accurate. If a new problem present, please set up appointment sooner than planned today.

## 2019-08-05 ENCOUNTER — Encounter: Payer: Self-pay | Admitting: Family Medicine

## 2019-08-05 MED ORDER — ATORVASTATIN CALCIUM 20 MG PO TABS: ORAL_TABLET | ORAL | 3 refills | Status: DC

## 2019-08-06 ENCOUNTER — Telehealth: Payer: Self-pay | Admitting: Family Medicine

## 2019-08-06 MED FILL — ATORVASTATIN 20 MG TABLET: 20 | 90 days supply | Qty: 45 | Fill #0

## 2019-08-06 NOTE — Telephone Encounter (Signed)
Please advise 

## 2019-08-06 NOTE — Telephone Encounter (Signed)
See note

## 2019-08-06 NOTE — Telephone Encounter (Signed)
I was told during visit he has plenty of atorvastatin because he is taking it every other day, we agreed to continue same dose.  [So future refill needs to reflect amount of tabs per month, in his case 45 tab for 3 months.] If refills are needed Rx as instructed above can be sent.  Thanks, BJ

## 2019-08-06 NOTE — Telephone Encounter (Signed)
Spoke with Daphne at the pharmacy and gave directions per Dr. Martinique.

## 2019-08-06 NOTE — Telephone Encounter (Signed)
Need to check direction for Atorvastatin 20mg . Please call [facility]aphane or [facility]

## 2019-08-11 ENCOUNTER — Other Ambulatory Visit (INDEPENDENT_AMBULATORY_CARE_PROVIDER_SITE_OTHER): Payer: Medicare HMO

## 2019-08-11 ENCOUNTER — Other Ambulatory Visit: Payer: Self-pay

## 2019-08-11 DIAGNOSIS — E875 Hyperkalemia: Secondary | ICD-10-CM

## 2019-08-11 LAB — POTASSIUM: Potassium: 4.5 mEq/L (ref 3.5–5.1)

## 2019-09-06 DIAGNOSIS — Z8546 Personal history of malignant neoplasm of prostate: Secondary | ICD-10-CM | POA: Diagnosis not present

## 2019-09-06 MED FILL — ADVAIR 250/50 DISKUS: 250-50 | 30 days supply | Qty: 60 | Fill #1

## 2019-09-13 DIAGNOSIS — Z8546 Personal history of malignant neoplasm of prostate: Secondary | ICD-10-CM | POA: Diagnosis not present

## 2019-09-13 DIAGNOSIS — R3915 Urgency of urination: Secondary | ICD-10-CM | POA: Diagnosis not present

## 2019-09-13 DIAGNOSIS — N401 Enlarged prostate with lower urinary tract symptoms: Secondary | ICD-10-CM | POA: Diagnosis not present

## 2019-09-24 ENCOUNTER — Ambulatory Visit (INDEPENDENT_AMBULATORY_CARE_PROVIDER_SITE_OTHER): Payer: Medicare HMO

## 2019-09-24 ENCOUNTER — Other Ambulatory Visit: Payer: Self-pay

## 2019-09-24 DIAGNOSIS — Z23 Encounter for immunization: Secondary | ICD-10-CM

## 2019-10-05 MED FILL — ATORVASTATIN 20 MG TABLET: 20 | 90 days supply | Qty: 45 | Fill #0

## 2019-10-05 MED FILL — ADVAIR 250/50 DISKUS: 250-50 | 30 days supply | Qty: 60 | Fill #2

## 2019-10-06 DIAGNOSIS — H52209 Unspecified astigmatism, unspecified eye: Secondary | ICD-10-CM | POA: Diagnosis not present

## 2019-10-06 DIAGNOSIS — H524 Presbyopia: Secondary | ICD-10-CM | POA: Diagnosis not present

## 2019-10-06 DIAGNOSIS — H5203 Hypermetropia, bilateral: Secondary | ICD-10-CM | POA: Diagnosis not present

## 2019-11-08 MED FILL — ADVAIR 250/50 DISKUS: 250-50 | 30 days supply | Qty: 60 | Fill #3

## 2019-12-10 DIAGNOSIS — Z8546 Personal history of malignant neoplasm of prostate: Secondary | ICD-10-CM | POA: Diagnosis not present

## 2019-12-15 DIAGNOSIS — R3915 Urgency of urination: Secondary | ICD-10-CM | POA: Diagnosis not present

## 2019-12-15 DIAGNOSIS — Z8546 Personal history of malignant neoplasm of prostate: Secondary | ICD-10-CM | POA: Diagnosis not present

## 2019-12-15 DIAGNOSIS — N401 Enlarged prostate with lower urinary tract symptoms: Secondary | ICD-10-CM | POA: Diagnosis not present

## 2019-12-15 MED FILL — TAMSULOSIN HCL 0.4 MG CAP: 0.4 | 30 days supply | Qty: 30 | Fill #0

## 2020-01-18 IMAGING — MR MR HEAD W/O CM
10 series · 48 of 48 positions shown · non-contrast
Comparison: None.

CLINICAL DATA: Slurred speech. 6 minutes episode of confusion and
expressive aphasia

EXAM:
MRI HEAD WITHOUT CONTRAST
TECHNIQUE: Multiplanar, multiecho pulse sequences of the brain and surrounding
structures were obtained without intravenous contrast.

[Series 5: T1 · sagittal · 4.0mm · 0.75mm/px · 2 of 33 slices shown (1 of 2)]
[im 1/33]
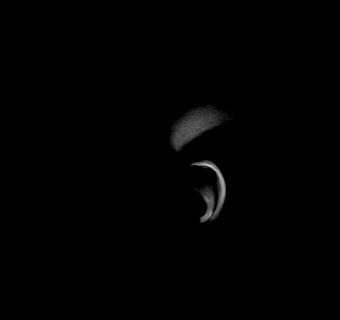
[im 33/33]
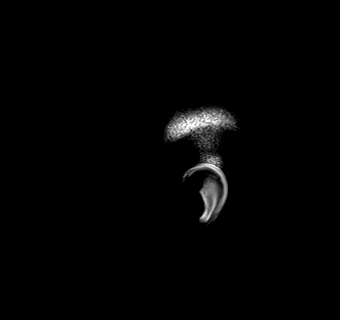

[Series 6: DWI · axial · 3.0mm · 1.50mm/px · z∈[-55,+97]mm · 7 of 94 slices shown (1 of 4)]
[im 1/94]
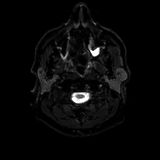
[im 16/94]
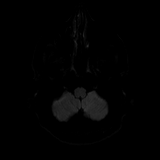
[im 32/94]
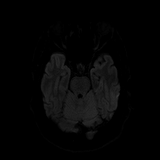
[im 47/94]
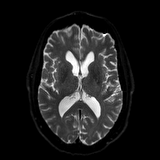
[im 63/94]
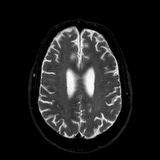
[im 78/94]
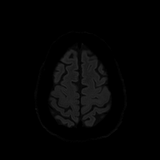
[im 94/94]
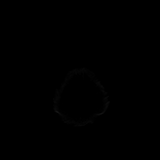

[Series 7: DWI · axial · 3.0mm · 1.50mm/px · z∈[-55,+97]mm · 4 of 47 slices shown (2 of 4)]
[im 1/47]
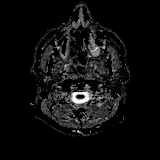
[im 16/47]
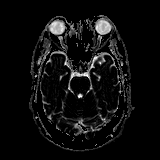
[im 31/47]
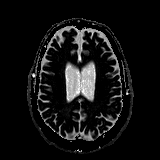
[im 47/47]
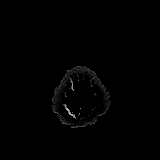

[Series 8: DWI · coronal · 5.0mm · 1.44mm/px · 5 of 72 slices shown (3 of 4)]
[im 1/72]
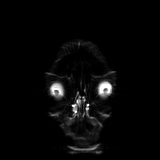
[im 18/72]
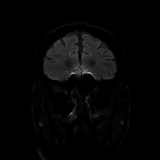
[im 36/72]
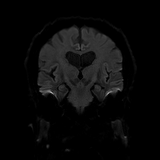
[im 54/72]
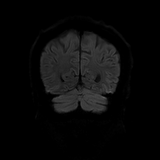
[im 72/72]
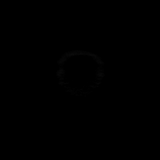

[Series 9: DWI · coronal · 5.0mm · 1.44mm/px · 3 of 36 slices shown (4 of 4)]
[im 1/36]
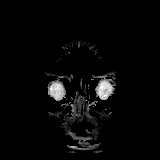
[im 18/36]
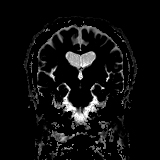
[im 36/36]
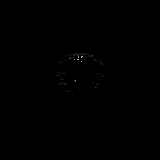

[Series 10: T2 · axial · 4.0mm · 0.36mm/px · z∈[-58,+98]mm · 2 of 30 slices shown (1 of 2)]
[im 1/30]
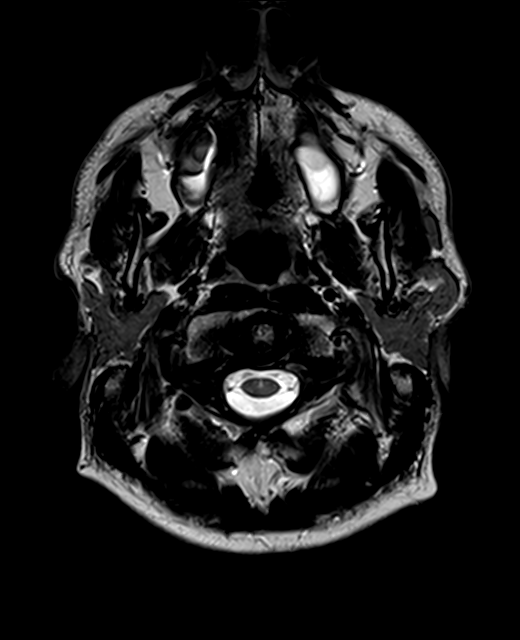
[im 30/30]
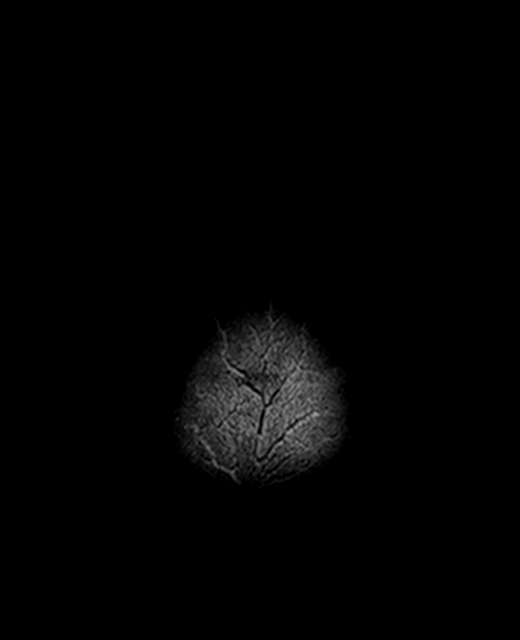

[Series 11: FLAIR · axial · 3.0mm · 0.72mm/px · z∈[-57,+99]mm · 2 of 27 slices shown]
[im 1/27]
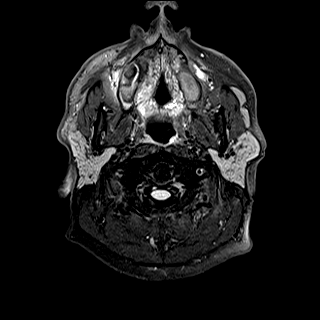
[im 27/27]
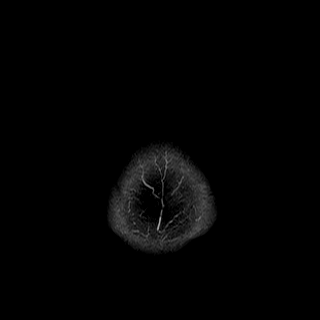

[Series 13: swi_images · axial · 1.5mm · 0.90mm/px · z∈[-57,+97]mm · 8 of 104 slices shown]
[im 1/104]
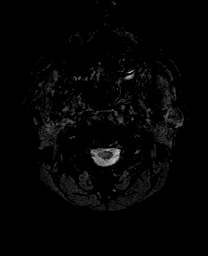
[im 15/104]
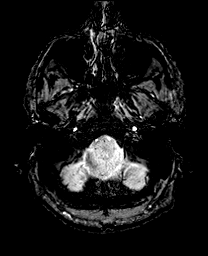
[im 30/104]
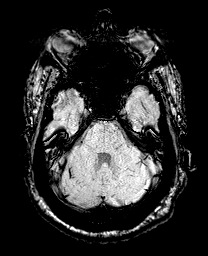
[im 45/104]
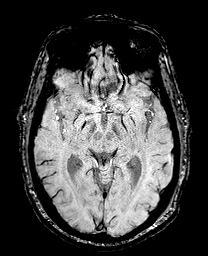
[im 59/104]
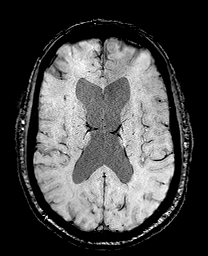
[im 74/104]
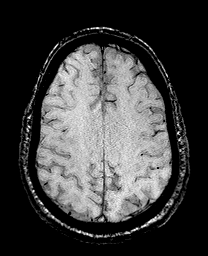
[im 89/104]
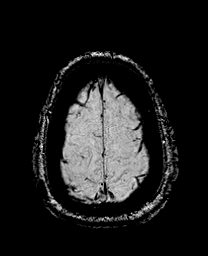
[im 104/104]
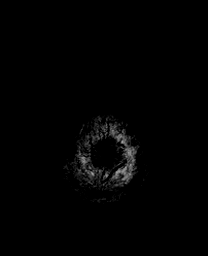

[Series 14: T1 · axial · 1.0mm · 0.90mm/px · z∈[-59,+100]mm · 12 of 160 slices shown (2 of 2)]
[im 1/160]
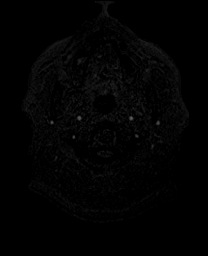
[im 15/160]
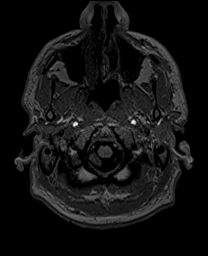
[im 29/160]
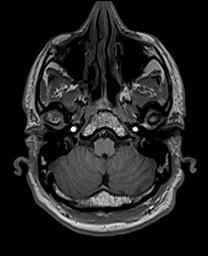
[im 44/160]
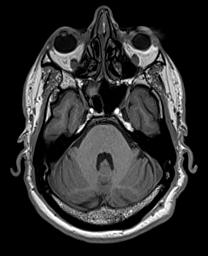
[im 58/160]
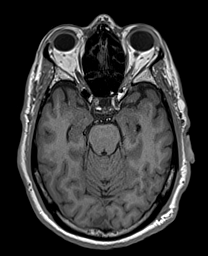
[im 73/160]
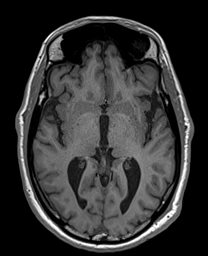
[im 87/160]
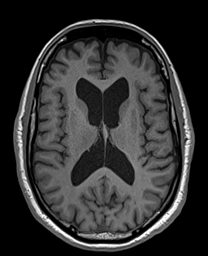
[im 102/160]
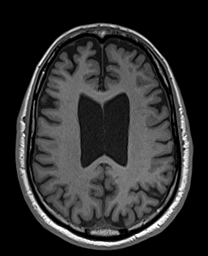
[im 116/160]
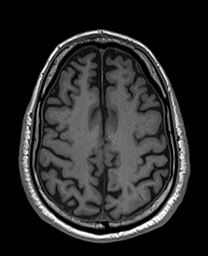
[im 131/160]
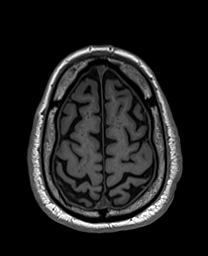
[im 145/160]
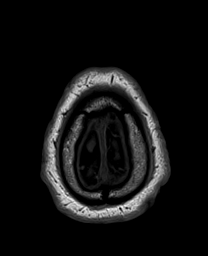
[im 160/160]
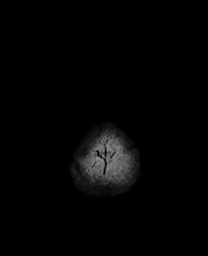

[Series 15: T2 · coronal · 4.5mm · 0.36mm/px · 3 of 34 slices shown (2 of 2)]
[im 1/34]
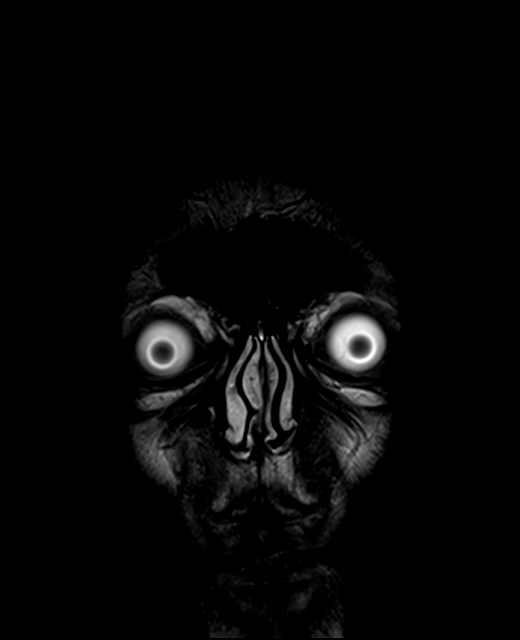
[im 17/34]
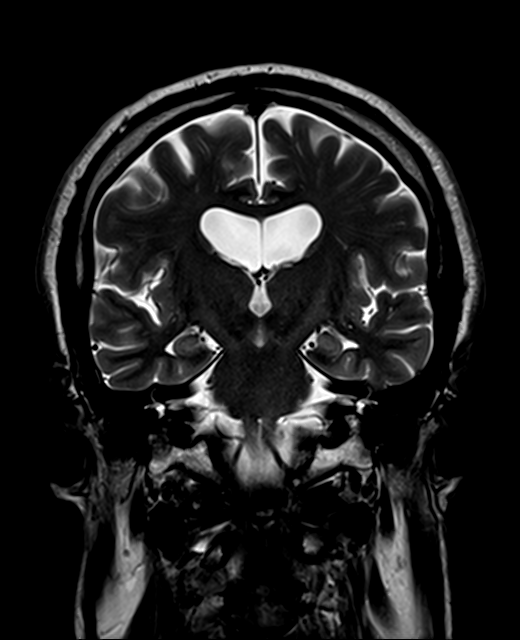
[im 34/34]
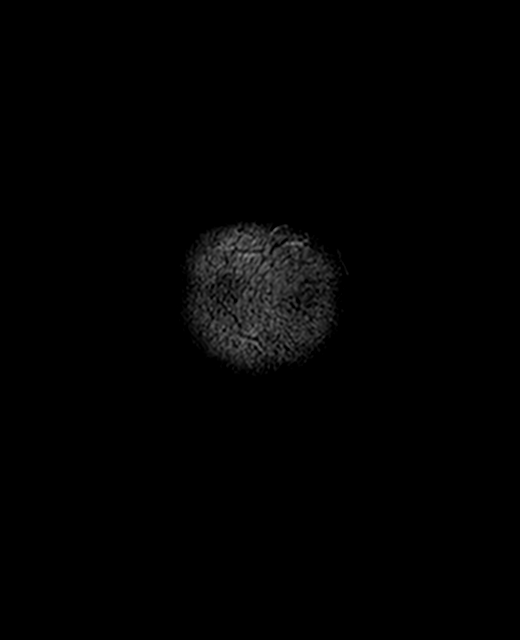

[48 of 48 positions shown; findings below may reference images not displayed]

FINDINGS: Brain: No acute infarction, hemorrhage, hydrocephalus, extra-axial
collection or mass lesion. Central predominant atrophy, mild. Rare
FLAIR hyperintensities in the cerebral white matter from nonspecific
remote insult.

Vascular: Major flow voids are preserved

Skull and upper cervical spine: No evidence of marrow lesion

Sinuses/Orbits: Mild mucosal thickening in the paranasal sinuses.
IMPRESSION: 1. No acute finding including infarct.
2. Few remote white matter insults, most often microvascular, less
than usually seen at the patient's age.

## 2020-02-03 MED FILL — TAMSULOSIN HCL 0.4 MG CAP: 0.4 | 30 days supply | Qty: 30 | Fill #1

## 2020-02-23 MED FILL — ATORVASTATIN 20 MG TABLET: 20 | 90 days supply | Qty: 45 | Fill #1

## 2020-03-06 DIAGNOSIS — Z8546 Personal history of malignant neoplasm of prostate: Secondary | ICD-10-CM | POA: Diagnosis not present

## 2020-03-13 DIAGNOSIS — R3915 Urgency of urination: Secondary | ICD-10-CM | POA: Diagnosis not present

## 2020-03-13 DIAGNOSIS — Z8546 Personal history of malignant neoplasm of prostate: Secondary | ICD-10-CM | POA: Diagnosis not present

## 2020-03-13 DIAGNOSIS — N401 Enlarged prostate with lower urinary tract symptoms: Secondary | ICD-10-CM | POA: Diagnosis not present

## 2020-04-05 MED FILL — ADVAIR 250/50 DISKUS: 250-50 | 30 days supply | Qty: 60 | Fill #4

## 2020-04-05 MED FILL — TAMSULOSIN HCL 0.4 MG CAP: 0.4 | 30 days supply | Qty: 30 | Fill #2

## 2020-05-08 ENCOUNTER — Other Ambulatory Visit: Payer: Self-pay

## 2020-05-08 ENCOUNTER — Ambulatory Visit (INDEPENDENT_AMBULATORY_CARE_PROVIDER_SITE_OTHER): Payer: Medicare HMO | Admitting: Family Medicine

## 2020-05-08 ENCOUNTER — Encounter: Payer: Self-pay | Admitting: Family Medicine

## 2020-05-08 VITALS — BP 140/80 | HR 56 | Temp 99.0°F | Resp 12 | Ht 72.0 in | Wt 190.0 lb

## 2020-05-08 DIAGNOSIS — H1132 Conjunctival hemorrhage, left eye: Secondary | ICD-10-CM

## 2020-05-08 DIAGNOSIS — C61 Malignant neoplasm of prostate: Secondary | ICD-10-CM

## 2020-05-08 DIAGNOSIS — I10 Essential (primary) hypertension: Secondary | ICD-10-CM

## 2020-05-08 LAB — CBC
HCT: 45.7 % (ref 39.0–52.0)
Hemoglobin: 14.9 g/dL (ref 13.0–17.0)
MCHC: 32.7 g/dL (ref 30.0–36.0)
MCV: 89.9 fl (ref 78.0–100.0)
Platelets: 234 10*3/uL (ref 150.0–400.0)
RBC: 5.08 Mil/uL (ref 4.22–5.81)
RDW: 13.7 % (ref 11.5–15.5)
WBC: 3.7 10*3/uL — ABNORMAL LOW (ref 4.0–10.5)

## 2020-05-08 LAB — BASIC METABOLIC PANEL
BUN: 11 mg/dL (ref 6–23)
CO2: 30 mEq/L (ref 19–32)
Calcium: 10 mg/dL (ref 8.4–10.5)
Chloride: 102 mEq/L (ref 96–112)
Creatinine, Ser: 1.22 mg/dL (ref 0.40–1.50)
GFR: 70.16 mL/min (ref >60.00)
Glucose, Bld: 134 mg/dL — ABNORMAL HIGH (ref 70–99)
Potassium: 4.6 mEq/L (ref 3.5–5.1)
Sodium: 139 mEq/L (ref 135–145)

## 2020-05-08 MED ORDER — AMLODIPINE BESYLATE 2.5 MG PO TABS: 2.5000 mg | ORAL_TABLET | Freq: Every day | ORAL | 1 refills | Status: DC

## 2020-05-08 MED FILL — AMLODIPINE 2.5 MG TABLET: 2.5 | 90 days supply | Qty: 90 | Fill #0

## 2020-05-08 NOTE — Progress Notes (Signed)
Chief Complaint  Patient presents with  . Hypertension    HPI:  [name] is a 74 y.o. male, who is here today because his BP has been elevated for the past few months at his urologist's office.  He was last seen on 08/04/19. Currently he is on nonpharmacologic treatment. During urology visits his BP has been elevated. 03/13/2020 BP was 160/92. 12/15/2019 BP was 180/82. 08/2019 BP was 151/73. Negative for severe/frequent headache, visual changes, chest pain, dyspnea, palpitation, claudication, focal weakness, or edema. He is not checking BP at home.  Lab Results  Component Value Date   CREATININE 1.15 08/04/2019   BUN 8 08/04/2019   NA 139 08/04/2019   K 4.5 08/11/2019   CL 104 08/04/2019   CO2 28 08/04/2019   2 days ago he woke up with left eye redness. His granddaughter noted it and called it to his attention. It is getting better. He denies fever,chills, eye pain or discharge,facial pain, or sore throat. Negative for nose/gum bleed,more bruising than usual,blood in stool,or gross hematuria. He has not tried OTC medications.  Hx of TIA, he is on Aspirin 81 mg daily.  Prostate cancer and BPH, he is following with urologist, Dr Jeffie Pollock.  Review of Systems  Constitutional: Negative for activity change, appetite change and fatigue.  Eyes: Negative for itching.  Respiratory: Negative for cough and wheezing.   Gastrointestinal: Negative for abdominal pain, nausea and vomiting.  Genitourinary: Negative for decreased urine volume and dysuria.  Musculoskeletal: Negative for gait problem and myalgias.  Skin: Negative for rash and wound.  Allergic/Immunologic: Positive for environmental allergies.  Neurological: Negative for syncope, facial asymmetry and light-headedness.  Rest of ROS, see pertinent positives sand negatives in HPI  Current Outpatient Medications on File Prior to Visit  Medication Sig Dispense Refill  . ADVAIR DISKUS 250-50 MCG/DOSE AEPB INHALE 1 PUFF  BY MOUTH INTO LUNGS TWICE DAILY 60 each 5  . aspirin EC 81 MG tablet Take 81 mg by mouth daily.    Marland Kitchen atorvastatin (LIPITOR) 20 MG tablet 1 tab every other day. 90 tablet 3  . Calcium Carbonate-Vitamin D (CALCARB 600/D PO) Take by mouth daily.     No current facility-administered medications on file prior to visit.   Past Medical History:  Diagnosis Date  . Allergy   . Asthma   . Chicken pox   . Hyperlipidemia   . Prostate cancer ([place])    Allergies  Allergen Reactions  . Penicillins Other (See Comments)    Dizziness     Social History   Socioeconomic History  . Marital status: Married    Spouse name: Not on file  . Number of children: Not on file  . Years of education: Not on file  . Highest education level: Not on file  Occupational History  . Occupation: retired  Tobacco Use  . Smoking status: Never Smoker  . Smokeless tobacco: Never Used  Substance and Sexual Activity  . Alcohol use: Yes    Alcohol/week: 1.0 standard drinks    Types: 1 Standard drinks or equivalent per week    Comment: per week  . Drug use: No  . Sexual activity: Not Currently  Other Topics Concern  . Not on file  Social History Narrative   Second marriage. Resides with wife in [place]. Retired. Has one grown child. 15 grandchildren to include his step grandchildren.    10-08-18 Unable to ask abuse questions wife with him today.   Social Determinants of Health  Financial Resource Strain:   . Difficulty of Paying Living Expenses:   Food Insecurity:   . Worried About Charity fundraiser in the Last Year:   . Arboriculturist in the Last Year:   Transportation Needs:   . Film/video editor (Medical):   Marland Kitchen Lack of Transportation (Non-Medical):   Physical Activity:   . Days of Exercise per Week:   . Minutes of Exercise per Session:   Stress:   . Feeling of Stress :   Social Connections:   . Frequency of Communication with Friends and Family:   . Frequency of Social Gatherings with  Friends and Family:   . Attends Religious Services:   . Active Member of Clubs or Organizations:   . Attends Archivist Meetings:   Marland Kitchen Marital Status:     Vitals:   05/08/20 1052  BP: 140/80  Pulse: (!) 56  Resp: 12  Temp: 99 F (37.2 C)  SpO2: 99%   Body mass index is 25.77 kg/m.   Physical Exam  Nursing note and vitals reviewed. Constitutional: He is oriented to person, place, and time. He appears well-developed and well-nourished. No distress.  HENT:  Head: Normocephalic and atraumatic.  Mouth/Throat: Oropharynx is clear and moist and mucous membranes are normal. He has dentures.  Eyes: Pupils are equal, round, and reactive to light. EOM are normal. Right eye exhibits no discharge. Left eye exhibits no discharge. Right conjunctiva is not injected. Right conjunctiva has no hemorrhage. Left conjunctiva has a hemorrhage.    Cardiovascular: Regular rhythm. Bradycardia present.  No murmur heard. Pulses:      Dorsalis pedis pulses are 2+ on the right side and 2+ on the left side.  Respiratory: Effort normal and breath sounds normal. No respiratory distress.  GI: Soft. He exhibits no mass. There is no hepatomegaly. There is no abdominal tenderness.  Musculoskeletal:        General: No edema.  Lymphadenopathy:    He has no cervical adenopathy.  Neurological: He is alert and oriented to person, place, and time. He has normal strength. No cranial nerve deficit. Gait normal.  Skin: Skin is warm. No rash noted. No erythema.  Psychiatric: He has a normal mood and affect. Cognition and memory are normal.  Well groomed, good eye contact.    ASSESSMENT AND PLAN:   Willie Parks was seen today for follow-up.  Orders Placed This Encounter  Procedures  . Basic metabolic panel  . CBC    Lab Results  Component Value Date   WBC 3.7 (L) 05/08/2020   HGB 14.9 05/08/2020   HCT 45.7 05/08/2020   MCV 89.9 05/08/2020   PLT 234.0 05/08/2020   Lab Results  Component  Value Date   CREATININE 1.22 05/08/2020   BUN 11 05/08/2020   NA 139 05/08/2020   K 4.6 05/08/2020   CL 102 05/08/2020   CO2 30 05/08/2020    1. Subconjunctival hemorrhage of left eye Educated about dx. Improving. Continue monitoring and instructed about warning signs.  2. Essential hypertension Today BP adequately controlled. Re-checked 138/65. He agrees with trying low dose Amlodipine , 2.5 mg, daily at bedtime. Instructed to monitor BP at home and to let me know about BP readings in 2-3 weeks. Continue low salt diet. Instructed about warning signs.  - amLODipine (NORVASC) 2.5 MG tablet; Take 1 tablet (2.5 mg total) by mouth at bedtime.  Dispense: 90 tablet; Refill: 1  3. Malignant neoplasm of prostate ([place])  Following with urologist.   Return in about 2 months (around 07/08/2020).   Edelyn Heidel G. [place], MD  [facility]. [place] office.   A few things to remember from today's visit:  Subconjunctival Hemorrhage Subconjunctival hemorrhage is bleeding that happens between the white part of your eye (sclera) and the clear membrane that covers the outside of your eye (conjunctiva). There are many tiny blood vessels near the surface of your eye. A subconjunctival hemorrhage happens when one or more of these vessels breaks and bleeds, causing a red patch to appear on your eye. This is similar to a bruise. Depending on the amount of bleeding, the red patch may only cover a small area of your eye or it may cover the entire visible part of the sclera. If a lot of blood collects under the conjunctiva, there may also be swelling. Subconjunctival hemorrhages do not affect your vision or cause pain, but your eye may feel irritated if there is swelling. Subconjunctival hemorrhages usually do not require treatment, and they usually disappear on their own within two weeks. What are the causes? This condition may be caused by:  Mild trauma, such as rubbing your eye too  hard.  Blunt injuries, such as from playing sports or having contact with a deployed airbag.  Coughing, sneezing, or vomiting.  Straining, such as when lifting a heavy object.  High blood pressure.  Recent eye surgery.  Diabetes.  Certain medicines, especially blood thinners (anticoagulants).  Other conditions, such as eye tumors, bleeding disorders, or blood vessel abnormalities. Subconjunctival hemorrhages can also happen without an obvious cause. What are the signs or symptoms? Symptoms of this condition include:  A bright red or dark red patch on the white part of the eye. The red area may: ? Spread out to cover a larger area of the eye before it goes away. ? Turn brownish-yellow before it goes away.  Swelling around the eye.  Mild eye irritation. How is this diagnosed? This condition is diagnosed with a physical exam. If your subconjunctival hemorrhage was caused by trauma, your health care provider may refer you to an eye specialist (ophthalmologist) or another specialist to check for other injuries. You may have other tests, including:  An eye exam.  A blood pressure check.  Blood tests to check for bleeding disorders. If your subconjunctival hemorrhage was caused by trauma, X-rays or a CT scan may be done to check for other injuries. How is this treated? Usually, treatment is not needed for this condition. If you have discomfort, your health care provider may recommend eye drops or cold compresses. Follow these instructions at home:  Take over-the-counter and prescription medicines only as directed by your health care provider.  Use eye drops or cold compresses to help with discomfort as directed by your health care provider.  Avoid activities, things, and environments that may irritate or injure your eye.  Keep all follow-up visits as told by your health care provider. This is important. Contact a health care provider if:  You have pain in your eye.  The  bleeding does not go away within 3 weeks.  You keep getting new subconjunctival hemorrhages. Get help right away if:  Your vision changes or you have difficulty seeing.  You suddenly develop severe sensitivity to light.  You develop a severe headache, persistent vomiting, confusion, or abnormal tiredness (lethargy).  Your eye seems to bulge or protrude from your eye socket.  You develop unexplained bruises on your body.  You have unexplained  bleeding in another area of your body. Summary  Subconjunctival hemorrhage is bleeding that happens between the white part of your eye and the clear membrane that covers the outside of your eye.  This condition is similar to a bruise.  Subconjunctival hemorrhages usually do not require treatment, and they usually disappear on their own within two weeks.  Use eye drops or cold compresses to help with discomfort as directed by your health care provider. This information is not intended to replace advice given to you by your health care provider. Make sure you discuss any questions you have with your health care provider. Document Revised: 05/26/2019 Document Reviewed: 09/09/2018 Elsevier Patient Education  [employer].  If you need refills please call your pharmacy. Do not use My Chart to request refills or for acute issues that need immediate attention.   Today we added amlodipine 2.5 mg.  To take at bedtime. Start monitoring your blood pressure at home and let me know about readings in about 3 weeks.    How to Take Your Blood Pressure You can take your blood pressure at home with a machine. You may need to check your blood pressure at home:  To check if you have high blood pressure (hypertension).  To check your blood pressure over time.  To make sure your blood pressure medicine is working. Supplies needed: You will need a blood pressure machine, or monitor. You can buy one at a drugstore or online. When choosing  one:  Choose one with an arm cuff.  Choose one that wraps around your upper arm. Only one finger should fit between your arm and the cuff.  Do not choose one that measures your blood pressure from your wrist or finger. Your doctor can suggest a monitor. How to prepare Avoid these things for 30 minutes before checking your blood pressure:  Drinking caffeine.  Drinking alcohol.  Eating.  Smoking.  Exercising. Five minutes before checking your blood pressure:  Pee.  Sit in a dining chair. Avoid sitting in a soft couch or armchair.  Be quiet. Do not talk. How to take your blood pressure Follow the instructions that came with your machine. If you have a digital blood pressure monitor, these may be the instructions: 1. Sit up straight. 2. Place your feet on the floor. Do not cross your ankles or legs. 3. Rest your left arm at the level of your heart. You may rest it on a table, desk, or chair. 4. Pull up your shirt sleeve. 5. Wrap the blood pressure cuff around the upper part of your left arm. The cuff should be 1 inch (2.5 cm) above your elbow. It is best to wrap the cuff around bare skin. 6. Fit the cuff snugly around your arm. You should be able to place only one finger between the cuff and your arm. 7. Put the cord inside the groove of your elbow. 8. Press the power button. 9. Sit quietly while the cuff fills with air and loses air. 10. Write down the numbers on the screen. 11. Wait 2-3 minutes and then repeat steps 1-10. What do the numbers mean? Two numbers make up your blood pressure. The first number is called systolic pressure. The second is called diastolic pressure. An example of a blood pressure reading is "120 over 80" (or 120/80). If you are an adult and do not have a medical condition, use this guide to find out if your blood pressure is normal: Normal  First number: below  120.  Second number: below 80. Elevated  First number: 120-129.  Second number:  below 80. Hypertension stage 1  First number: 130-139.  Second number: 80-89. Hypertension stage 2  First number: 140 or above.  Second number: 53 or above. Your blood pressure is above normal even if only the top or bottom number is above normal. Follow these instructions at home:  Check your blood pressure as often as your doctor tells you to.  Take your monitor to your next doctor's appointment. Your doctor will: ? Make sure you are using it correctly. ? Make sure it is working right.  Make sure you understand what your blood pressure numbers should be.  Tell your doctor if your medicines are causing side effects. Contact a doctor if:  Your blood pressure keeps being high. Get help right away if:  Your first blood pressure number is higher than 180.  Your second blood pressure number is higher than 120. This information is not intended to replace advice given to you by your health care provider. Make sure you discuss any questions you have with your health care provider. Document Revised: 11/21/2017 Document Reviewed: 05/17/2016 Elsevier Patient Education  2020 [employer].   If you need refills please call your pharmacy. Do not use My Chart to request refills or for acute issues that need immediate attention.     Please be sure medication list is accurate. If a new problem present, please set up appointment sooner than planned today.

## 2020-05-08 NOTE — Patient Instructions (Addendum)
A few things to remember from today's visit:   Essential hypertension  Malignant neoplasm of prostate ([place]), Chronic  Hyperlipidemia, unspecified hyperlipidemia type   Subconjunctival Hemorrhage Subconjunctival hemorrhage is bleeding that happens between the white part of your eye (sclera) and the clear membrane that covers the outside of your eye (conjunctiva). There are many tiny blood vessels near the surface of your eye. A subconjunctival hemorrhage happens when one or more of these vessels breaks and bleeds, causing a red patch to appear on your eye. This is similar to a bruise. Depending on the amount of bleeding, the red patch may only cover a small area of your eye or it may cover the entire visible part of the sclera. If a lot of blood collects under the conjunctiva, there may also be swelling. Subconjunctival hemorrhages do not affect your vision or cause pain, but your eye may feel irritated if there is swelling. Subconjunctival hemorrhages usually do not require treatment, and they usually disappear on their own within two weeks. What are the causes? This condition may be caused by:  Mild trauma, such as rubbing your eye too hard.  Blunt injuries, such as from playing sports or having contact with a deployed airbag.  Coughing, sneezing, or vomiting.  Straining, such as when lifting a heavy object.  High blood pressure.  Recent eye surgery.  Diabetes.  Certain medicines, especially blood thinners (anticoagulants).  Other conditions, such as eye tumors, bleeding disorders, or blood vessel abnormalities. Subconjunctival hemorrhages can also happen without an obvious cause. What are the signs or symptoms? Symptoms of this condition include:  A bright red or dark red patch on the white part of the eye. The red area may: ? Spread out to cover a larger area of the eye before it goes away. ? Turn brownish-yellow before it goes away.  Swelling around the eye.  Mild eye  irritation. How is this diagnosed? This condition is diagnosed with a physical exam. If your subconjunctival hemorrhage was caused by trauma, your health care provider may refer you to an eye specialist (ophthalmologist) or another specialist to check for other injuries. You may have other tests, including:  An eye exam.  A blood pressure check.  Blood tests to check for bleeding disorders. If your subconjunctival hemorrhage was caused by trauma, X-rays or a CT scan may be done to check for other injuries. How is this treated? Usually, treatment is not needed for this condition. If you have discomfort, your health care provider may recommend eye drops or cold compresses. Follow these instructions at home:  Take over-the-counter and prescription medicines only as directed by your health care provider.  Use eye drops or cold compresses to help with discomfort as directed by your health care provider.  Avoid activities, things, and environments that may irritate or injure your eye.  Keep all follow-up visits as told by your health care provider. This is important. Contact a health care provider if:  You have pain in your eye.  The bleeding does not go away within 3 weeks.  You keep getting new subconjunctival hemorrhages. Get help right away if:  Your vision changes or you have difficulty seeing.  You suddenly develop severe sensitivity to light.  You develop a severe headache, persistent vomiting, confusion, or abnormal tiredness (lethargy).  Your eye seems to bulge or protrude from your eye socket.  You develop unexplained bruises on your body.  You have unexplained bleeding in another area of your body. Summary  Subconjunctival  hemorrhage is bleeding that happens between the white part of your eye and the clear membrane that covers the outside of your eye.  This condition is similar to a bruise.  Subconjunctival hemorrhages usually do not require treatment, and they  usually disappear on their own within two weeks.  Use eye drops or cold compresses to help with discomfort as directed by your health care provider. This information is not intended to replace advice given to you by your health care provider. Make sure you discuss any questions you have with your health care provider. Document Revised: 05/26/2019 Document Reviewed: 09/09/2018 Elsevier Patient Education  [employer].  If you need refills please call your pharmacy. Do not use My Chart to request refills or for acute issues that need immediate attention.   Today we added amlodipine 2.5 mg.  To take at bedtime. Start monitoring your blood pressure at home and let me know about readings in about 3 weeks.    How to Take Your Blood Pressure You can take your blood pressure at home with a machine. You may need to check your blood pressure at home:  To check if you have high blood pressure (hypertension).  To check your blood pressure over time.  To make sure your blood pressure medicine is working. Supplies needed: You will need a blood pressure machine, or monitor. You can buy one at a drugstore or online. When choosing one:  Choose one with an arm cuff.  Choose one that wraps around your upper arm. Only one finger should fit between your arm and the cuff.  Do not choose one that measures your blood pressure from your wrist or finger. Your doctor can suggest a monitor. How to prepare Avoid these things for 30 minutes before checking your blood pressure:  Drinking caffeine.  Drinking alcohol.  Eating.  Smoking.  Exercising. Five minutes before checking your blood pressure:  Pee.  Sit in a dining chair. Avoid sitting in a soft couch or armchair.  Be quiet. Do not talk. How to take your blood pressure Follow the instructions that came with your machine. If you have a digital blood pressure monitor, these may be the instructions: 1. Sit up straight. 2. Place your  feet on the floor. Do not cross your ankles or legs. 3. Rest your left arm at the level of your heart. You may rest it on a table, desk, or chair. 4. Pull up your shirt sleeve. 5. Wrap the blood pressure cuff around the upper part of your left arm. The cuff should be 1 inch (2.5 cm) above your elbow. It is best to wrap the cuff around bare skin. 6. Fit the cuff snugly around your arm. You should be able to place only one finger between the cuff and your arm. 7. Put the cord inside the groove of your elbow. 8. Press the power button. 9. Sit quietly while the cuff fills with air and loses air. 10. Write down the numbers on the screen. 11. Wait 2-3 minutes and then repeat steps 1-10. What do the numbers mean? Two numbers make up your blood pressure. The first number is called systolic pressure. The second is called diastolic pressure. An example of a blood pressure reading is "120 over 80" (or 120/80). If you are an adult and do not have a medical condition, use this guide to find out if your blood pressure is normal: Normal  First number: below 120.  Second number: below 80. Elevated  First  number: [id number].  Second number: below 80. Hypertension stage 1  First number: 130-139.  Second number: 80-89. Hypertension stage 2  First number: 140 or above.  Second number: 44 or above. Your blood pressure is above normal even if only the top or bottom number is above normal. Follow these instructions at home:  Check your blood pressure as often as your doctor tells you to.  Take your monitor to your next doctor's appointment. Your doctor will: ? Make sure you are using it correctly. ? Make sure it is working right.  Make sure you understand what your blood pressure numbers should be.  Tell your doctor if your medicines are causing side effects. Contact a doctor if:  Your blood pressure keeps being high. Get help right away if:  Your first blood pressure number is higher than  180.  Your second blood pressure number is higher than 120. This information is not intended to replace advice given to you by your health care provider. Make sure you discuss any questions you have with your health care provider. Document Revised: 11/21/2017 Document Reviewed: 05/17/2016 Elsevier Patient Education  2020 [employer].   If you need refills please call your pharmacy. Do not use My Chart to request refills or for acute issues that need immediate attention.     Please be sure medication list is accurate. If a new problem present, please set up appointment sooner than planned today.

## 2020-07-05 ENCOUNTER — Ambulatory Visit (INDEPENDENT_AMBULATORY_CARE_PROVIDER_SITE_OTHER): Payer: Medicare HMO | Admitting: Family Medicine

## 2020-07-05 ENCOUNTER — Other Ambulatory Visit: Payer: Self-pay | Admitting: Family Medicine

## 2020-07-05 ENCOUNTER — Encounter: Payer: Self-pay | Admitting: Family Medicine

## 2020-07-05 ENCOUNTER — Other Ambulatory Visit: Payer: Self-pay

## 2020-07-05 VITALS — BP 128/70 | HR 61 | Resp 12 | Ht 72.0 in | Wt 184.2 lb

## 2020-07-05 DIAGNOSIS — I10 Essential (primary) hypertension: Secondary | ICD-10-CM

## 2020-07-05 DIAGNOSIS — R252 Cramp and spasm: Secondary | ICD-10-CM

## 2020-07-05 DIAGNOSIS — E785 Hyperlipidemia, unspecified: Secondary | ICD-10-CM | POA: Diagnosis not present

## 2020-07-05 DIAGNOSIS — R739 Hyperglycemia, unspecified: Secondary | ICD-10-CM | POA: Diagnosis not present

## 2020-07-05 DIAGNOSIS — G459 Transient cerebral ischemic attack, unspecified: Secondary | ICD-10-CM

## 2020-07-05 DIAGNOSIS — J4520 Mild intermittent asthma, uncomplicated: Secondary | ICD-10-CM

## 2020-07-05 LAB — POCT GLYCOSYLATED HEMOGLOBIN (HGB A1C): Hemoglobin A1C: 6 % — AB (ref 4.0–5.6)

## 2020-07-05 MED ORDER — FLUTICASONE-SALMETEROL 250-50 MCG/DOSE IN AEPB: INHALATION_SPRAY | RESPIRATORY_TRACT | 6 refills | Status: DC

## 2020-07-05 MED ORDER — ATORVASTATIN CALCIUM 20 MG PO TABS: ORAL_TABLET | ORAL | 3 refills | Status: DC

## 2020-07-05 MED FILL — ADVAIR 250/50 DISKUS: 250-50 | 30 days supply | Qty: 60 | Fill #0

## 2020-07-05 NOTE — Progress Notes (Signed)
[name] is a 74 y.o.male, who is here today to follow on HTN. He was last seen on 05/08/20, when it was a concerned about elevated BP a few times at his urologist's office. Amlodipine 2.5 mg was recommended,he did not start medication.  He has not noted unusual headache, visual changes, exertional chest pain, dyspnea,  focal weakness, or edema. Home BP readings: Not checking.  Lab Results  Component Value Date   CREATININE 1.22 05/08/2020   BUN 11 05/08/2020   NA 139 05/08/2020   K 4.6 05/08/2020   CL 102 05/08/2020   CO2 30 05/08/2020   HLD: He is on Atorvastatin 20 mg daily. TIA: 06/23/18 episode of aphasia that lasted about 6 min. Brain MRI on 06/24/18 1. No acute finding including infarct. 2. Few remote white matter insults, most often microvascular, less than usually seen at the patient's age. He is on Aspirin 81 mg daily.  Lab Results  Component Value Date   CHOL 157 08/04/2019   HDL 56.80 08/04/2019   LDLCALC 82 08/04/2019   LDLDIRECT 115.0 06/18/2017   TRIG 91.0 08/04/2019   CHOLHDL 3 08/04/2019   Asthma: He is on Advair 250-50 mcg bid. He has not used  Albuterol inh in a while. Negative for cough,wheezing,or SOB. No Hx of tobacco use.  His wife is concerned about "jerking" like movement in bed when asleep. He may have up to 3 episodes and a few times per week for a few months. No associated bowel/bladder dysfunction.  Glucose 134 0n 05/08/20. No hx of diabetes. Negative for polydipsia,polyuria, or polyphagia.  Review of Systems  Constitutional: Negative for activity change, appetite change, fever and unexpected weight change.  HENT: Negative for mouth sores, nosebleeds and sore throat.   Gastrointestinal: Negative for abdominal pain, nausea and vomiting.       No changes in bowel habits.  Musculoskeletal: Negative for gait problem and myalgias.  Allergic/Immunologic: Positive for environmental allergies.  Neurological: Negative for tremors and  headaches.  Psychiatric/Behavioral: Negative for confusion.  Rest see pertinent positives and negatives per HPI.  Current Outpatient Medications on File Prior to Visit  Medication Sig Dispense Refill  . aspirin EC 81 MG tablet Take 81 mg by mouth daily.    . Calcium Carbonate-Vitamin D (CALCARB 600/D PO) Take by mouth daily.    . tamsulosin (FLOMAX) 0.4 MG CAPS capsule Take 1 capsule by mouth daily.     No current facility-administered medications on file prior to visit.   Past Medical History:  Diagnosis Date  . Allergy   . Asthma   . Chicken pox   . Hyperlipidemia   . Prostate cancer ([place])     Allergies  Allergen Reactions  . Penicillins Other (See Comments)    Dizziness    Social History   Socioeconomic History  . Marital status: Married    Spouse name: Not on file  . Number of children: Not on file  . Years of education: Not on file  . Highest education level: Not on file  Occupational History  . Occupation: retired  Tobacco Use  . Smoking status: Never Smoker  . Smokeless tobacco: Never Used  Vaping Use  . Vaping Use: Never used  Substance and Sexual Activity  . Alcohol use: Yes    Alcohol/week: 1.0 standard drink    Types: 1 Standard drinks or equivalent per week    Comment: per week  . Drug use: No  . Sexual activity: Not Currently  Other Topics Concern  . Not on file  Social History Narrative   Second marriage. Resides with wife in [place]. Retired. Has one grown child. 15 grandchildren to include his step grandchildren.    10-08-18 Unable to ask abuse questions wife with him today.   Social Determinants of Health   Financial Resource Strain:   . Difficulty of Paying Living Expenses:   Food Insecurity:   . Worried About Charity fundraiser in the Last Year:   . Arboriculturist in the Last Year:   Transportation Needs:   . Film/video editor (Medical):   Marland Kitchen Lack of Transportation (Non-Medical):   Physical Activity:   . Days of Exercise  per Week:   . Minutes of Exercise per Session:   Stress:   . Feeling of Stress :   Social Connections:   . Frequency of Communication with Friends and Family:   . Frequency of Social Gatherings with Friends and Family:   . Attends Religious Services:   . Active Member of Clubs or Organizations:   . Attends Archivist Meetings:   Marland Kitchen Marital Status:     Vitals:   07/05/20 0724  BP: 128/70  Pulse: 61  Resp: 12  SpO2: 97%   Body mass index is 24.99 kg/m.  Physical Exam Vitals and nursing note reviewed.  Constitutional:      General: He is not in acute distress.    Appearance: He is well-developed and normal weight.  HENT:     Head: Normocephalic and atraumatic.     Mouth/Throat:     Mouth: Mucous membranes are moist.     Dentition: Has dentures.     Pharynx: Oropharynx is clear.  Eyes:     Conjunctiva/sclera: Conjunctivae normal.     Pupils: Pupils are equal, round, and reactive to light.  Cardiovascular:     Rate and Rhythm: Normal rate and regular rhythm.     Pulses:          Dorsalis pedis pulses are 2+ on the right side and 2+ on the left side.     Heart sounds: No murmur heard.   Pulmonary:     Effort: Pulmonary effort is normal. No respiratory distress.     Breath sounds: Normal breath sounds.  Abdominal:     Palpations: Abdomen is soft. There is no hepatomegaly or mass.     Tenderness: There is no abdominal tenderness.  Lymphadenopathy:     Cervical: No cervical adenopathy.  Skin:    General: Skin is warm.     Findings: No erythema or rash.  Neurological:     Mental Status: He is alert and oriented to person, place, and time.     Cranial Nerves: No cranial nerve deficit.     Motor: Tremor (Mild left hand) present.     Gait: Gait normal.     Deep Tendon Reflexes:     Reflex Scores:      Patellar reflexes are 2+ on the right side and 2+ on the left side. Psychiatric:     Comments: Well groomed, good eye contact.    ASSESSMENT AND  PLAN:   [name] was seen today for follow-up.  Diagnoses and all orders for this visit:  Lab Results  Component Value Date   HGBA1C 6.0 (A) 07/05/2020    Jerking movements of extremities Possible etiologies discussed. Brain MRI vs neuro evaluation,wife prefers the latter one but she would like to look for provider and  let me know,has no called yet,so I placed referral. Instructed about warning signs.  -     Ambulatory referral to Neurology  Essential hypertension Today BP is adequately controlled. Continue non pharmacologic treatment. Instructed to monitor BP at home and low salt diet. We discussed possible complications of elevated BP.  Hyperglycemia Prediabetes. Healthy life style for primary prevention of diabetes recommended.  Mild intermittent asthma, uncomplicated Problem is well controlled. No changes in current management.  -     Fluticasone-Salmeterol (ADVAIR DISKUS) 250-50 MCG/DOSE AEPB; INHALE 1 PUFF BY MOUTH INTO LUNGS TWICE DAILY  TIA (transient ischemic attack) Continue Aspirin 81 mg daily and Atorvastatin.  -     atorvastatin (LIPITOR) 20 MG tablet; 1 tab every other day.  Hyperlipidemia, unspecified hyperlipidemia type No changes in current management, Will check FLP in 07/2020.  -     atorvastatin (LIPITOR) 20 MG tablet; 1 tab every other day.   Return in about 4 months (around 11/05/2020) for CPE and AWV.   Adonijah Baena G. Martinique, MD  [facility]. [place] office.  Hyperglycemia - Plan: POC HgB A1c  Mild intermittent asthma, uncomplicated - Plan: Fluticasone-Salmeterol (ADVAIR DISKUS) 250-50 MCG/DOSE AEPB  TIA (transient ischemic attack) - Plan: atorvastatin (LIPITOR) 20 MG tablet  Hyperlipidemia, unspecified hyperlipidemia type - Plan: atorvastatin (LIPITOR) 20 MG tablet  No changes today. Let me know about neuro provider to place referral.   If you need refills please call your pharmacy. Do not use My Chart to request refills  or for acute issues that need immediate attention.    Please be sure medication list is accurate. If a new problem present, please set up appointment sooner than planned today.

## 2020-07-05 NOTE — Patient Instructions (Addendum)
A few things to remember from today's visit:   Jerking movements of extremities  Essential hypertension  Hyperglycemia - Plan: POC HgB A1c  Mild intermittent asthma, uncomplicated - Plan: Fluticasone-Salmeterol (ADVAIR DISKUS) 250-50 MCG/DOSE AEPB  TIA (transient ischemic attack) - Plan: atorvastatin (LIPITOR) 20 MG tablet  Hyperlipidemia, unspecified hyperlipidemia type - Plan: atorvastatin (LIPITOR) 20 MG tablet  No changes today. Let me know about neuro provider to place referral.   If you need refills please call your pharmacy. Do not use My Chart to request refills or for acute issues that need immediate attention.    Please be sure medication list is accurate. If a new problem present, please set up appointment sooner than planned today.

## 2020-07-09 ENCOUNTER — Encounter: Payer: Self-pay | Admitting: Family Medicine

## 2020-08-04 MED FILL — ADVAIR 250/50 DISKUS: 250-50 | 30 days supply | Qty: 60 | Fill #0

## 2020-09-04 DIAGNOSIS — Z8546 Personal history of malignant neoplasm of prostate: Secondary | ICD-10-CM | POA: Diagnosis not present

## 2020-09-04 DIAGNOSIS — N401 Enlarged prostate with lower urinary tract symptoms: Secondary | ICD-10-CM | POA: Diagnosis not present

## 2020-09-04 DIAGNOSIS — R310 Gross hematuria: Secondary | ICD-10-CM | POA: Diagnosis not present

## 2020-09-04 DIAGNOSIS — R350 Frequency of micturition: Secondary | ICD-10-CM | POA: Diagnosis not present

## 2020-09-07 DIAGNOSIS — R310 Gross hematuria: Secondary | ICD-10-CM | POA: Diagnosis not present

## 2020-09-07 DIAGNOSIS — N400 Enlarged prostate without lower urinary tract symptoms: Secondary | ICD-10-CM | POA: Diagnosis not present

## 2020-09-13 DIAGNOSIS — Z8546 Personal history of malignant neoplasm of prostate: Secondary | ICD-10-CM | POA: Diagnosis not present

## 2020-09-13 MED FILL — DOXAZOSIN MESYLATE 4 MG TAB: 4 | 30 days supply | Qty: 30 | Fill #0

## 2020-09-21 MED FILL — ATORVASTATIN CALCIUM 20 MG: 20 | 90 days supply | Qty: 45 | Fill #0

## 2020-09-26 ENCOUNTER — Other Ambulatory Visit: Payer: Self-pay

## 2020-09-26 DIAGNOSIS — E785 Hyperlipidemia, unspecified: Secondary | ICD-10-CM

## 2020-09-26 DIAGNOSIS — G459 Transient cerebral ischemic attack, unspecified: Secondary | ICD-10-CM

## 2020-09-26 MED ORDER — ATORVASTATIN CALCIUM 20 MG PO TABS: ORAL_TABLET | ORAL | 3 refills | Status: DC

## 2020-11-06 ENCOUNTER — Other Ambulatory Visit: Payer: Self-pay

## 2020-11-06 ENCOUNTER — Encounter: Payer: Self-pay | Admitting: Family Medicine

## 2020-11-06 ENCOUNTER — Ambulatory Visit (INDEPENDENT_AMBULATORY_CARE_PROVIDER_SITE_OTHER): Payer: Medicare HMO | Admitting: Family Medicine

## 2020-11-06 VITALS — BP 124/70 | HR 62 | Resp 16 | Ht 72.0 in | Wt 186.0 lb

## 2020-11-06 DIAGNOSIS — E785 Hyperlipidemia, unspecified: Secondary | ICD-10-CM

## 2020-11-06 DIAGNOSIS — Z23 Encounter for immunization: Secondary | ICD-10-CM | POA: Diagnosis not present

## 2020-11-06 DIAGNOSIS — R7303 Prediabetes: Secondary | ICD-10-CM | POA: Diagnosis not present

## 2020-11-06 DIAGNOSIS — E559 Vitamin D deficiency, unspecified: Secondary | ICD-10-CM

## 2020-11-06 DIAGNOSIS — I10 Essential (primary) hypertension: Secondary | ICD-10-CM

## 2020-11-06 DIAGNOSIS — G459 Transient cerebral ischemic attack, unspecified: Secondary | ICD-10-CM | POA: Diagnosis not present

## 2020-11-06 DIAGNOSIS — Z0000 Encounter for general adult medical examination without abnormal findings: Secondary | ICD-10-CM

## 2020-11-06 NOTE — Patient Instructions (Signed)
A few things to remember from today's visit:   Routine general medical examination at a health care facility  Vitamin D deficiency - Plan: VITAMIN D 25 Hydroxy (Vit-D Deficiency, Fractures)  Essential hypertension - Plan: COMPLETE METABOLIC PANEL WITH GFR  Hyperlipidemia, unspecified hyperlipidemia type - Plan: COMPLETE METABOLIC PANEL WITH GFR, Lipid panel  Prediabetes - Plan: Hemoglobin A1c, COMPLETE METABOLIC PANEL WITH GFR  If you need refills please call your pharmacy. Do not use My Chart to request refills or for acute issues that need immediate attention.    Please be sure medication list is accurate. If a new problem present, please set up appointment sooner than planned today. A few tips:  -As we age balance is not as good as it was, so there is a higher risks for falls. Please remove small rugs and furniture that is "in your way" and could increase the risk of falls. Stretching exercises may help with fall prevention: Yoga and Tai Chi are some examples. Low impact exercise is better, so you are not very achy the next day.  -Sun screen and avoidance of direct sun light recommended. Caution with dehydration, if working outdoors be sure to drink enough fluids.  - Some medications are not safe as we age, increases the risk of side effects and can potentially interact with other medication you are also taken;  including some of over the counter medications. Be sure to let me know when you start a new medication even if it is a dietary/vitamin supplement.   -Healthy diet low in red meet/animal fat and sugar + regular physical activity is recommended.

## 2020-11-06 NOTE — Progress Notes (Signed)
HPI:  [name] is a 74 y.o.male here today with his wife for his routine physical examination.  Last CPE: 06/18/17. He lives with his wife.  Regular exercise 3 or more times per week: Not consistently. Following a healthy diet: Not consistently.  Chronic medical problems: Prostate cancer,HLD,asthma,TIA,and HTN.  Immunization History  Administered Date(s) Administered  . Fluad Quad(high Dose 65+) 09/24/2019, 11/06/2020  . Influenza, High Dose Seasonal PF 01/30/2018  . Influenza,inj,Quad PF,6+ Mos 01/22/2019  . PFIZER SARS-COV-2 Vaccination 03/23/2020, 04/18/2020   -Hep C screening: 06/23/18 NR.  Last colon cancer screening: He is due next year. Last prostate ca screening: 0.4 on 09/04/20. He follows with urologist.  Negative for high alcohol intake and tobacco use.  -Concerns and/or follow up today:  HgA1C 6.0 in 06/2020. Negative for polydipsia,polyuria, or polyphagia.  HTN: On non pharmacologic treatment.  HLD: He is on Atorvastatin 20 mg daily.  Component     Latest Ref Rng & Units 08/04/2019  Cholesterol     <200 mg/dL 157  Triglycerides     <150 mg/dL 91.0  HDL Cholesterol     > OR = 40 mg/dL 56.80  VLDL     0.0 - 40.0 mg/dL 18.2  LDL (calc)     0 - 99 mg/dL 82  Total CHOL/HDL Ratio     <5.0 (calc) 3  NonHDL      99.77   TIA: He stopped Aspirin because episode of gross hematuria a few months ago.  Vit D def: He is on Vit D 500 U daily.  Review of Systems  Constitutional: Negative for activity change, appetite change, fatigue and fever.  HENT: Negative for mouth sores, nosebleeds and sore throat.   Eyes: Negative for redness and visual disturbance.  Respiratory: Negative for cough, shortness of breath and wheezing.   Cardiovascular: Negative for chest pain, palpitations and leg swelling.  Gastrointestinal: Negative for abdominal pain, nausea and vomiting.  Endocrine: Negative for cold intolerance, heat intolerance and polyphagia.   Genitourinary: Negative for decreased urine volume and dysuria.  Musculoskeletal: Negative for gait problem and myalgias.  Skin: Negative for pallor and rash.  Allergic/Immunologic: Positive for environmental allergies.  Neurological: Negative for syncope, weakness and headaches.  Psychiatric/Behavioral: Negative for confusion. The patient is not nervous/anxious.   All other systems reviewed and are negative.  Current Outpatient Medications on File Prior to Visit  Medication Sig Dispense Refill  . atorvastatin (LIPITOR) 20 MG tablet 1 tab every other day. 90 tablet 3  . Calcium Carbonate-Vitamin D (CALCARB 600/D PO) Take by mouth daily.    . Fluticasone-Salmeterol (ADVAIR DISKUS) 250-50 MCG/DOSE AEPB INHALE 1 PUFF BY MOUTH INTO LUNGS TWICE DAILY 60 each 6  . tamsulosin (FLOMAX) 0.4 MG CAPS capsule Take 1 capsule by mouth daily.     No current facility-administered medications on file prior to visit.   Past Medical History:  Diagnosis Date  . Allergy   . Asthma   . Chicken pox   . Hyperlipidemia   . Prostate cancer [facility])     Past Surgical History:  Procedure Laterality Date  . KNEE ARTHROSCOPY Right   . WRIST SURGERY Right     Allergies  Allergen Reactions  . Penicillins Other (See Comments)    Dizziness     Family History  Problem Relation Age of Onset  . Cancer Neg Hx   . Diabetes Neg Hx   . Prostate cancer Neg Hx   . Colon cancer Neg Hx   .  Breast cancer Neg Hx     Social History   Socioeconomic History  . Marital status: Married    Spouse name: Not on file  . Number of children: Not on file  . Years of education: Not on file  . Highest education level: Not on file  Occupational History  . Occupation: retired  Tobacco Use  . Smoking status: Never Smoker  . Smokeless tobacco: Never Used  Vaping Use  . Vaping Use: Never used  Substance and Sexual Activity  . Alcohol use: Yes    Alcohol/week: 1.0 standard drink    Types: 1 Standard drinks or  equivalent per week    Comment: per week  . Drug use: No  . Sexual activity: Not Currently  Other Topics Concern  . Not on file  Social History Narrative   Second marriage. Resides with wife in [place]. Retired. Has one grown child. 15 grandchildren to include his step grandchildren.    10-08-18 Unable to ask abuse questions wife with him today.   Social Determinants of Health   Financial Resource Strain:   . Difficulty of Paying Living Expenses: Not on file  Food Insecurity:   . Worried About Charity fundraiser in the Last Year: Not on file  . Ran Out of Food in the Last Year: Not on file  Transportation Needs:   . Lack of Transportation (Medical): Not on file  . Lack of Transportation (Non-Medical): Not on file  Physical Activity:   . Days of Exercise per Week: Not on file  . Minutes of Exercise per Session: Not on file  Stress:   . Feeling of Stress : Not on file  Social Connections:   . Frequency of Communication with Friends and Family: Not on file  . Frequency of Social Gatherings with Friends and Family: Not on file  . Attends Religious Services: Not on file  . Active Member of Clubs or Organizations: Not on file  . Attends Archivist Meetings: Not on file  . Marital Status: Not on file    Vitals:   11/06/20 0757  BP: 124/70  Pulse: 62  Resp: 16  SpO2: 97%   Body mass index is 25.23 kg/m.  Wt Readings from Last 3 Encounters:  11/06/20 186 lb (84.4 kg)  07/05/20 184 lb 4 oz (83.6 kg)  05/08/20 190 lb (86.2 kg)   Physical Exam Vitals and nursing note reviewed.  Constitutional:      General: He is not in acute distress.    Appearance: He is well-developed.  HENT:     Head: Atraumatic.     Right Ear: Tympanic membrane, ear canal and external ear normal.     Left Ear: Tympanic membrane, ear canal and external ear normal.     Mouth/Throat:     Dentition: Has dentures.  Eyes:     Conjunctiva/sclera: Conjunctivae normal.     Pupils: Pupils  are equal, round, and reactive to light.  Neck:     Thyroid: No thyromegaly.     Trachea: No tracheal deviation.  Cardiovascular:     Rate and Rhythm: Normal rate and regular rhythm.     Pulses:          Dorsalis pedis pulses are 2+ on the right side and 2+ on the left side.     Heart sounds: No murmur heard.   Pulmonary:     Effort: Pulmonary effort is normal. No respiratory distress.     Breath sounds: Normal breath  sounds.  Abdominal:     Palpations: Abdomen is soft. There is no hepatomegaly or mass.     Tenderness: There is no abdominal tenderness.  Genitourinary:    Comments: No concerns. Musculoskeletal:        General: No tenderness.     Cervical back: Normal range of motion.     Comments: No major deformities appreciated and no signs of synovitis.  Lymphadenopathy:     Cervical: No cervical adenopathy.     Upper Body:     Right upper body: No supraclavicular adenopathy.     Left upper body: No supraclavicular adenopathy.  Skin:    General: Skin is warm.     Findings: No erythema.  Neurological:     Mental Status: He is alert and oriented to person, place, and time.     Cranial Nerves: No cranial nerve deficit.     Sensory: No sensory deficit.     Coordination: Coordination normal.     Gait: Gait normal.     Deep Tendon Reflexes:     Reflex Scores:      Bicep reflexes are 2+ on the right side and 2+ on the left side.      Patellar reflexes are 2+ on the right side and 2+ on the left side.   ASSESSMENT AND PLAN:  [email or web address] was seen today for annual exam.  Diagnoses and all orders for this visit:  Orders Placed This Encounter  Procedures  . Flu Vaccine QUAD High Dose(Fluad)  . Hemoglobin A1c  . COMPLETE METABOLIC PANEL WITH GFR  . Lipid panel  . VITAMIN D 25 Hydroxy (Vit-D Deficiency, Fractures)   Lab Results  Component Value Date   CHOL 156 11/06/2020   HDL 69 11/06/2020   LDLCALC 68 11/06/2020   LDLDIRECT 115.0 06/18/2017   TRIG 104 11/06/2020    CHOLHDL 2.3 11/06/2020   Lab Results  Component Value Date   CREATININE 1.21 (H) 11/06/2020   BUN 8 11/06/2020   NA 141 11/06/2020   K 4.3 11/06/2020   CL 103 11/06/2020   CO2 26 11/06/2020   Serum creatinine: 1.21 mg/dL (H) 11/06/20 0847 Estimated creatinine clearance: 57.9 mL/min (A)  Lab Results  Component Value Date   ALT 24 11/06/2020   AST 20 11/06/2020   ALKPHOS 86 08/04/2019   BILITOT 0.8 11/06/2020   Lab Results  Component Value Date   HGBA1C 6.2 (H) 11/06/2020   Routine general medical examination at a health care facility We discussed the importance of regular physical activity and healthy diet for prevention of chronic illness and/or complications. Preventive guidelines reviewed. Vaccination up to date.  Next CPE in a year.  Vitamin D deficiency Continue Vit D 500 U daily.  Essential hypertension -Continue non pharmacologic treatment.  Hyperlipidemia, unspecified hyperlipidemia type Continue Atorvastatin 20 mg daily.  Prediabetes Healthy life style for primary prevention.  TIA (transient ischemic attack) We discussed side effects of aspirin and benefits in regard to secondary prevention. He would like to resume and if another episode of hematuria, he will discontinue and let me know. Continue Atorvastatin 20 mg daily.  Need for influenza vaccination -     Flu Vaccine QUAD High Dose(Fluad)   Return in 1 year (on 11/06/2021) for cpe.   Willie Berenson G. Martinique, MD  [facility]. [place] office.  A few things to remember from today's visit:   Routine general medical examination at a health care facility  Vitamin D deficiency - Plan: VITAMIN D 25  Hydroxy (Vit-D Deficiency, Fractures)  Essential hypertension - Plan: COMPLETE METABOLIC PANEL WITH GFR  Hyperlipidemia, unspecified hyperlipidemia type - Plan: COMPLETE METABOLIC PANEL WITH GFR, Lipid panel  Prediabetes - Plan: Hemoglobin A1c, COMPLETE METABOLIC PANEL WITH GFR  If you need  refills please call your pharmacy. Do not use My Chart to request refills or for acute issues that need immediate attention.    Please be sure medication list is accurate. If a new problem present, please set up appointment sooner than planned today. A few tips:  -As we age balance is not as good as it was, so there is a higher risks for falls. Please remove small rugs and furniture that is "in your way" and could increase the risk of falls. Stretching exercises may help with fall prevention: Yoga and Tai Chi are some examples. Low impact exercise is better, so you are not very achy the next day.  -Sun screen and avoidance of direct sun light recommended. Caution with dehydration, if working outdoors be sure to drink enough fluids.  - Some medications are not safe as we age, increases the risk of side effects and can potentially interact with other medication you are also taken;  including some of over the counter medications. Be sure to let me know when you start a new medication even if it is a dietary/vitamin supplement.   -Healthy diet low in red meet/animal fat and sugar + regular physical activity is recommended.

## 2020-11-07 LAB — COMPLETE METABOLIC PANEL WITH GFR
AG Ratio: 1.9 (calc) (ref 1.0–2.5)
ALT: 24 U/L (ref 9–46)
AST: 20 U/L (ref 10–35)
Albumin: 5 g/dL (ref 3.6–5.1)
Alkaline phosphatase (APISO): 75 U/L (ref 35–144)
BUN/Creatinine Ratio: 7 (calc) (ref 6–22)
BUN: 8 mg/dL (ref 7–25)
CO2: 26 mmol/L (ref 20–32)
Calcium: 10.1 mg/dL (ref 8.6–10.3)
Chloride: 103 mmol/L (ref 98–110)
Creat: 1.21 mg/dL — ABNORMAL HIGH (ref 0.70–1.18)
GFR, Est African American: 67 mL/min/{1.73_m2} (ref >=60)
GFR, Est Non African American: 58 mL/min/{1.73_m2} — ABNORMAL LOW (ref >=60)
Globulin: 2.7 g/dL (calc) (ref 1.9–3.7)
Glucose, Bld: 119 mg/dL — ABNORMAL HIGH (ref 65–99)
Potassium: 4.3 mmol/L (ref 3.5–5.3)
Sodium: 141 mmol/L (ref 135–146)
Total Bilirubin: 0.8 mg/dL (ref 0.2–1.2)
Total Protein: 7.7 g/dL (ref 6.1–8.1)

## 2020-11-07 LAB — LIPID PANEL
Cholesterol: 156 mg/dL (ref <200)
HDL: 69 mg/dL (ref >=40)
LDL Cholesterol (Calc): 68 mg/dL (calc)
Non-HDL Cholesterol (Calc): 87 mg/dL (calc) (ref <130)
Total CHOL/HDL Ratio: 2.3 (calc) (ref <5.0)
Triglycerides: 104 mg/dL (ref <150)

## 2020-11-07 LAB — HEMOGLOBIN A1C
Hgb A1c MFr Bld: 6.2 % of total Hgb — ABNORMAL HIGH (ref <5.7)
Mean Plasma Glucose: 131 (calc)
eAG (mmol/L): 7.3 (calc)

## 2020-11-07 LAB — VITAMIN D 25 HYDROXY (VIT D DEFICIENCY, FRACTURES): Vit D, 25-Hydroxy: 25 ng/mL — ABNORMAL LOW (ref 30–100)

## 2020-11-09 ENCOUNTER — Encounter: Payer: Self-pay | Admitting: Family Medicine

## 2020-11-13 DIAGNOSIS — Z8546 Personal history of malignant neoplasm of prostate: Secondary | ICD-10-CM | POA: Diagnosis not present

## 2020-11-20 DIAGNOSIS — R3915 Urgency of urination: Secondary | ICD-10-CM | POA: Diagnosis not present

## 2020-11-20 DIAGNOSIS — N3041 Irradiation cystitis with hematuria: Secondary | ICD-10-CM | POA: Diagnosis not present

## 2020-11-20 DIAGNOSIS — Z8546 Personal history of malignant neoplasm of prostate: Secondary | ICD-10-CM | POA: Diagnosis not present

## 2020-11-20 DIAGNOSIS — N401 Enlarged prostate with lower urinary tract symptoms: Secondary | ICD-10-CM | POA: Diagnosis not present

## 2020-11-20 DIAGNOSIS — R9721 Rising PSA following treatment for malignant neoplasm of prostate: Secondary | ICD-10-CM | POA: Diagnosis not present

## 2020-11-21 MED FILL — ADVAIR 250/50 DISKUS: 250-50 | 30 days supply | Qty: 60 | Fill #1

## 2020-12-25 MED FILL — ATORVASTATIN CALCIUM 20 MG: 20 | 90 days supply | Qty: 45 | Fill #1

## 2020-12-30 ENCOUNTER — Ambulatory Visit: Payer: Medicare HMO | Attending: Internal Medicine

## 2020-12-30 DIAGNOSIS — Z23 Encounter for immunization: Secondary | ICD-10-CM

## 2020-12-30 NOTE — Progress Notes (Signed)
   Covid-19 Vaccination Clinic  Name:  Tiegan Terpstra    MRN: [id number] DOB: 1946-11-22  12/30/2020  Mr. Jech was observed post Covid-19 immunization for 15 minutes without incident. He was provided with Vaccine Information Sheet and instruction to access the V-Safe system.   Mr. Lanum was instructed to call 911 with any severe reactions post vaccine: Marland Kitchen Difficulty breathing  . Swelling of face and throat  . A fast heartbeat  . A bad rash all over body  . Dizziness and weakness   Immunizations Administered    Name Date Dose VIS Date Route   Pfizer COVID-19 Vaccine 12/30/2020  9:45 AM 0.3 mL 10/11/2020 Intramuscular   Manufacturer: [place]   Lot: [id number]   NDC: [address]-5

## 2021-01-03 MED FILL — ADVAIR 250/50 DISKUS: 250-50 | 30 days supply | Qty: 60 | Fill #2

## 2021-01-04 ENCOUNTER — Telehealth: Payer: Self-pay | Admitting: Family Medicine

## 2021-01-04 DIAGNOSIS — M25569 Pain in unspecified knee: Secondary | ICD-10-CM

## 2021-01-04 NOTE — Telephone Encounter (Signed)
Patient wife is calling and stated that patient is having knee pain when he walks and is now swollen and wanted to see if provider can refer him to an orthopedic specialist, pease advise. CB is [phone]

## 2021-01-05 NOTE — Telephone Encounter (Signed)
Discussed with pcp at last few visits, referral entered.

## 2021-01-09 ENCOUNTER — Ambulatory Visit: Payer: Medicare HMO | Admitting: Orthopaedic Surgery

## 2021-01-18 ENCOUNTER — Encounter: Payer: Self-pay | Admitting: Orthopaedic Surgery

## 2021-01-18 ENCOUNTER — Ambulatory Visit (INDEPENDENT_AMBULATORY_CARE_PROVIDER_SITE_OTHER): Payer: Medicare HMO

## 2021-01-18 ENCOUNTER — Ambulatory Visit (INDEPENDENT_AMBULATORY_CARE_PROVIDER_SITE_OTHER): Payer: Medicare HMO | Admitting: Orthopaedic Surgery

## 2021-01-18 DIAGNOSIS — M25561 Pain in right knee: Secondary | ICD-10-CM

## 2021-01-18 DIAGNOSIS — G8929 Other chronic pain: Secondary | ICD-10-CM

## 2021-01-18 MED ORDER — LIDOCAINE HCL 1 % IJ SOLN
2.0000 mL | INTRAMUSCULAR | Status: AC | PRN
  Administered 2021-01-18: 2 mL

## 2021-01-18 MED ORDER — METHYLPREDNISOLONE ACETATE 40 MG/ML IJ SUSP
40.0000 mg | INTRAMUSCULAR | Status: AC | PRN
Start: 2021-01-18 — End: 2021-01-18
  Administered 2021-01-18: 40 mg via INTRA_ARTICULAR

## 2021-01-18 MED ORDER — BUPIVACAINE HCL 0.25 % IJ SOLN
2.0000 mL | INTRAMUSCULAR | Status: AC | PRN
  Administered 2021-01-18: 2 mL via INTRA_ARTICULAR

## 2021-01-18 NOTE — Progress Notes (Signed)
Office Visit Note   Patient: Willie Parks           Date of Birth: Jun 29, 1946           MRN: [id number] Visit Date: 01/18/2021              Requested by: Martinique, Betty G, MD [address] [place],  [place] [address] PCP: Martinique, Betty G, MD   Assessment & Plan: Visit Diagnoses:  1. Chronic pain of right knee     Plan: Impression is right knee osteoarthritis with possible degenerative lateral meniscus tear.  We have discussed cortisone injection for which the patient would like to proceed.  He will follow up with [place] as needed.  Follow-Up Instructions: Return if symptoms worsen or fail to improve.   Orders:  Orders Placed This Encounter  Procedures  . Large Joint Inj: R knee  . XR KNEE 3 VIEW RIGHT   No orders of the defined types were placed in this encounter.     Procedures: Large Joint Inj: R knee on 01/18/2021 8:30 AM Indications: pain Details: 22 G needle, anterolateral approach Medications: 2 mL lidocaine 1 %; 2 mL bupivacaine 0.25 %; 40 mg methylPREDNISolone acetate 40 MG/ML      Clinical Data: No additional findings.   Subjective: Chief Complaint  Patient presents with  . Right Knee - Pain    HPI patient is a pleasant 75 year old gentleman who comes in today with right knee pain for the past several months.  No known injury.  The pain has progressively worsened.  It is located throughout the entire knee worse with any twisting motion.  He denies any pain at rest or at night.  No mechanical symptoms.  He does not take anything for the pain.  He does note a previous history of right knee arthroscopy several years ago in [place].  No previous cortisone injection.  Review of Systems as detailed in HPI.  All others reviewed and are negative.   Objective: Vital Signs: There were no vitals taken for this visit.  Physical Exam well-developed well-nourished gentleman in no acute distress.  Alert oriented x3.  Ortho Exam right knee exam shows no  effusion.  Range of motion 0 to 115 degrees.  Lateral joint line tenderness.  Moderate patellofemoral crepitus.  Ligaments are stable.  He is neurovascular intact distally.  Specialty Comments:  No specialty comments available.  Imaging: XR KNEE 3 VIEW RIGHT  Result Date: 01/18/2021 X-rays demonstrate moderate tricompartmental degenerative changes    PMFS History: Patient Active Problem List   Diagnosis Date Noted  . TIA (transient ischemic attack) 06/26/2018  . Malignant neoplasm of prostate ([place]) 04/30/2018  . Vitamin D deficiency 02/10/2017  . BPH associated with nocturia 02/10/2017  . Hyperlipidemia 02/10/2017  . Essential hypertension 10/08/2016  . Mild intermittent asthma, uncomplicated 65/78/4696   Past Medical History:  Diagnosis Date  . Allergy   . Asthma   . Chicken pox   . Hyperlipidemia   . Prostate cancer [facility])     Family History  Problem Relation Age of Onset  . Cancer Neg Hx   . Diabetes Neg Hx   . Prostate cancer Neg Hx   . Colon cancer Neg Hx   . Breast cancer Neg Hx     Past Surgical History:  Procedure Laterality Date  . KNEE ARTHROSCOPY Right   . WRIST SURGERY Right    Social History   Occupational History  . Occupation: retired  Tobacco Use  .  Smoking status: Never Smoker  . Smokeless tobacco: Never Used  Vaping Use  . Vaping Use: Never used  Substance and Sexual Activity  . Alcohol use: Yes    Alcohol/week: 1.0 standard drink    Types: 1 Standard drinks or equivalent per week    Comment: per week  . Drug use: No  . Sexual activity: Not Currently

## 2021-01-22 ENCOUNTER — Ambulatory Visit (INDEPENDENT_AMBULATORY_CARE_PROVIDER_SITE_OTHER): Payer: Medicare HMO | Admitting: Family Medicine

## 2021-01-22 ENCOUNTER — Encounter: Payer: Self-pay | Admitting: Family Medicine

## 2021-01-22 ENCOUNTER — Ambulatory Visit (INDEPENDENT_AMBULATORY_CARE_PROVIDER_SITE_OTHER): Payer: Medicare HMO

## 2021-01-22 ENCOUNTER — Other Ambulatory Visit: Payer: Self-pay

## 2021-01-22 VITALS — BP 126/70 | HR 63 | Temp 97.8°F | Resp 16 | Ht 72.0 in | Wt 184.5 lb

## 2021-01-22 DIAGNOSIS — I10 Essential (primary) hypertension: Secondary | ICD-10-CM

## 2021-01-22 DIAGNOSIS — I499 Cardiac arrhythmia, unspecified: Secondary | ICD-10-CM | POA: Diagnosis not present

## 2021-01-22 DIAGNOSIS — R066 Hiccough: Secondary | ICD-10-CM

## 2021-01-22 DIAGNOSIS — C61 Malignant neoplasm of prostate: Secondary | ICD-10-CM

## 2021-01-22 DIAGNOSIS — R110 Nausea: Secondary | ICD-10-CM | POA: Diagnosis not present

## 2021-01-22 LAB — COMPREHENSIVE METABOLIC PANEL
ALT: 35 U/L (ref 0–53)
AST: 18 U/L (ref 0–37)
Albumin: 4.5 g/dL (ref 3.5–5.2)
Alkaline Phosphatase: 79 U/L (ref 39–117)
BUN: 13 mg/dL (ref 6–23)
CO2: 32 mEq/L (ref 19–32)
Calcium: 10.2 mg/dL (ref 8.4–10.5)
Chloride: 102 mEq/L (ref 96–112)
Creatinine, Ser: 1.25 mg/dL (ref 0.40–1.50)
GFR: 56.44 mL/min — ABNORMAL LOW (ref >60.00)
Glucose, Bld: 111 mg/dL — ABNORMAL HIGH (ref 70–99)
Potassium: 4.6 mEq/L (ref 3.5–5.1)
Sodium: 139 mEq/L (ref 135–145)
Total Bilirubin: 0.7 mg/dL (ref 0.2–1.2)
Total Protein: 7.4 g/dL (ref 6.0–8.3)

## 2021-01-22 LAB — CBC
HCT: 47.1 % (ref 39.0–52.0)
Hemoglobin: 15.3 g/dL (ref 13.0–17.0)
MCHC: 32.5 g/dL (ref 30.0–36.0)
MCV: 87.8 fl (ref 78.0–100.0)
Platelets: 244 10*3/uL (ref 150.0–400.0)
RBC: 5.36 Mil/uL (ref 4.22–5.81)
RDW: 13.9 % (ref 11.5–15.5)
WBC: 4.6 10*3/uL (ref 4.0–10.5)

## 2021-01-22 LAB — MAGNESIUM: Magnesium: 2.1 mg/dL (ref 1.5–2.5)

## 2021-01-22 LAB — TSH: TSH: 1.17 u[IU]/mL (ref 0.35–4.50)

## 2021-01-22 NOTE — Progress Notes (Addendum)
Chief Complaint  Patient presents with  . Nausea    Feels like he is going to throw up when he bends down, but he doesn't.    HPI: Willie Parks is a 75 y.o. male, who is here today with his wife with above complaint. He and his wife have a hard time describing problem. He states that it is not nausea. His wife can hear it like a loud "retch."  Problem has been going on for a couple of weeks. Like "belch or hiccup" that happens once at the time and a few times during the day and at night. It keeps him from sleep. He has decreased appetite, "still eating." No dysphagia,stridor, or heartburn.  Prostate cancer, follows with urologist. He is not on pharmacologic treatment.  Problem has improved since yesterday. Denies abdominal pain, nausea, vomiting, changes in bowel habits, blood in stool or melena.  Mildly irregular HR, which he has had before. He denies CP,SOB,palpitations,cough,wheezing. HTN on non pharmacologist treatment. BP has been mildly elevated during visit. He is not checking BP at home.  Component     Latest Ref Rng & Units 11/06/2020  Glucose     70 - 99 mg/dL 119 (H)  BUN     6 - 23 mg/dL 8  Creatinine     0.40 - 1.50 mg/dL 1.21 (H)  GFR, Est Non African American     > OR = 60 mL/min/1.75m2 58 (L)  GFR, Est African American     > OR = 60 mL/min/1.57m2 67  BUN/Creatinine Ratio     6 - 22 (calc) 7  Sodium     135 - 145 mEq/L 141  Potassium     3.5 - 5.1 mEq/L 4.3  Chloride     96 - 112 mEq/L 103  CO2     19 - 32 mEq/L 26  Calcium     8.4 - 10.5 mg/dL 10.1  Total Protein     6.0 - 8.3 g/dL 7.7  Albumin MSPROF     3.6 - 5.1 g/dL 5.0  Globulin     1.9 - 3.7 g/dL (calc) 2.7  AG Ratio     1.0 - 2.5 (calc) 1.9  Total Bilirubin     0.2 - 1.2 mg/dL 0.8  Alkaline phosphatase (APISO)     35 - 144 U/L 75  AST     0 - 37 U/L 20  ALT     0 - 53 U/L 24   Review of Systems  Constitutional: Negative for activity change, chills, fatigue and  fever.  HENT: Negative for postnasal drip and sore throat.   Genitourinary: Negative for decreased urine volume and hematuria.  Musculoskeletal: Negative for gait problem and myalgias.  Skin: Negative for pallor and rash.  Neurological: Negative for syncope, weakness and headaches.  Rest see pertinent positives and negatives per HPI.  Current Outpatient Medications on File Prior to Visit  Medication Sig Dispense Refill  . atorvastatin (LIPITOR) 20 MG tablet 1 tab every other day. 90 tablet 3  . Calcium Carbonate-Vitamin D (CALCARB 600/D PO) Take by mouth daily.    . Fluticasone-Salmeterol (ADVAIR DISKUS) 250-50 MCG/DOSE AEPB INHALE 1 PUFF BY MOUTH INTO LUNGS TWICE DAILY 60 each 6   No current facility-administered medications on file prior to visit.   Past Medical History:  Diagnosis Date  . Allergy   . Asthma   . Chicken pox   . Hyperlipidemia   . Prostate cancer ([place])  Allergies  Allergen Reactions  . Penicillins Other (See Comments)    Dizziness     Social History   Socioeconomic History  . Marital status: Married    Spouse name: Not on file  . Number of children: Not on file  . Years of education: Not on file  . Highest education level: Not on file  Occupational History  . Occupation: retired  Tobacco Use  . Smoking status: Never Smoker  . Smokeless tobacco: Never Used  Vaping Use  . Vaping Use: Never used  Substance and Sexual Activity  . Alcohol use: Yes    Alcohol/week: 1.0 standard drink    Types: 1 Standard drinks or equivalent per week    Comment: per week  . Drug use: No  . Sexual activity: Not Currently  Other Topics Concern  . Not on file  Social History Narrative   Second marriage. Resides with wife in [place]. Retired. Has one grown child. 15 grandchildren to include his step grandchildren.    10-08-18 Unable to ask abuse questions wife with him today.   Social Determinants of Health   Financial Resource Strain: Not on file  Food  Insecurity: Not on file  Transportation Needs: Not on file  Physical Activity: Not on file  Stress: Not on file  Social Connections: Not on file   Vitals:   01/22/21 0655  BP: 126/70  Pulse: 63  Resp: 16  Temp: 97.8 F (36.6 C)  SpO2: 97%   Wt Readings from Last 3 Encounters:  01/22/21 184 lb 8 oz (83.7 kg)  11/06/20 186 lb (84.4 kg)  07/05/20 184 lb 4 oz (83.6 kg)   Body mass index is 25.02 kg/m.  Physical Exam Vitals and nursing note reviewed.  Constitutional:      General: He is not in acute distress.    Appearance: He is well-developed and normal weight.  HENT:     Head: Normocephalic and atraumatic.     Mouth/Throat:     Mouth: Mucous membranes are moist.     Dentition: Has dentures.     Pharynx: Oropharynx is clear.  Eyes:     Conjunctiva/sclera: Conjunctivae normal.  Cardiovascular:     Rate and Rhythm: Normal rate and regular rhythm. Occasional extrasystoles are present.    Heart sounds: No murmur heard.   Pulmonary:     Effort: Pulmonary effort is normal. No respiratory distress.     Breath sounds: Normal breath sounds.  Chest:  Breasts:     Right: No supraclavicular adenopathy.     Left: No supraclavicular adenopathy.    Abdominal:     Palpations: Abdomen is soft. There is no hepatomegaly, splenomegaly or mass.     Tenderness: There is no abdominal tenderness.  Musculoskeletal:     Right lower leg: No edema.     Left lower leg: No edema.  Lymphadenopathy:     Cervical: No cervical adenopathy.     Upper Body:     Right upper body: No supraclavicular adenopathy.     Left upper body: No supraclavicular adenopathy.  Skin:    General: Skin is warm.     Findings: No erythema or rash.  Neurological:     Mental Status: He is alert and oriented to person, place, and time.     Cranial Nerves: No cranial nerve deficit.     Gait: Gait normal.  Psychiatric:     Comments: Well groomed, good eye contact.    ASSESSMENT AND PLAN:  Willie Parks  was seen  today for nausea.  Diagnoses and all orders for this visit: Orders Placed This Encounter  Procedures  . DG Chest 2 View  . Comprehensive metabolic panel  . CBC  . TSH  . Magnesium  . EKG 12-Lead   Lab Results  Component Value Date   CREATININE 1.25 01/22/2021   BUN 13 01/22/2021   NA 139 01/22/2021   K 4.6 01/22/2021   CL 102 01/22/2021   CO2 32 01/22/2021   Lab Results  Component Value Date   TSH 1.17 01/22/2021   Lab Results  Component Value Date   WBC 4.6 01/22/2021   HGB 15.3 01/22/2021   HCT 47.1 01/22/2021   MCV 87.8 01/22/2021   PLT 244.0 01/22/2021   Lab Results  Component Value Date   ALT 35 01/22/2021   AST 18 01/22/2021   ALKPHOS 79 01/22/2021   BILITOT 0.7 01/22/2021   Hiccup Vs "retching", hard to described but it was interfering with sleep. We discussed possible etiologies, most benign. He did not have episodes during OV. Further recommendations according to lab results. We could consider trial of PPI if labs are normal and problem continues.  Essential hypertension BP adequately controlled. Continue non pharmacologic treatment.  Irregular heart rhythm It seems to be asymptomatic. Today EKG: SR, PAC's, normal axis, 1rs AV block,lateral T wave abnormalities. Comparing with EKG done in 05/2017, PVC's are not longer present. Cardiology evaluation will be arranged. Clearly instructed about warning signs.  Malignant neoplasm of prostate [facility]) Following with urologist.  Spent 41 minutes.  During this time history was obtained and documented, examination was performed, prior labs reviewed, and assessment/plan discussed.  Return if symptoms worsen or fail to improve, for Keep next appt.   Betty G. Martinique, MD  [facility]. [place] office.  A few things to remember from today's visit:   Essential hypertension - Plan: EKG 12-Lead, Comprehensive metabolic panel, CBC  Hiccup - Plan: Comprehensive metabolic panel, DG Chest 2  View  Irregular heart rhythm - Plan: TSH, Magnesium  If you need refills please call your pharmacy. Do not use My Chart to request refills or for acute issues that need immediate attention.   Today I did not find abnormalities that could explain symptoms. Because abnormal EKG I am sending you to the cardiologist.  I am running some labs and imaging. Continue monitoring for new symptom.  Please be sure medication list is accurate. If a new problem present, please set up appointment sooner than planned today.

## 2021-01-22 NOTE — Patient Instructions (Addendum)
A few things to remember from today's visit:   Essential hypertension - Plan: EKG 12-Lead, Comprehensive metabolic panel, CBC  Hiccup - Plan: Comprehensive metabolic panel, DG Chest 2 View  Irregular heart rhythm - Plan: TSH, Magnesium  If you need refills please call your pharmacy. Do not use My Chart to request refills or for acute issues that need immediate attention.   Today I did not find abnormalities that could explain symptoms. Because abnormal EKG I am sending you to the cardiologist.  I am running some labs and imaging. Continue monitoring for new symptom.  Please be sure medication list is accurate. If a new problem present, please set up appointment sooner than planned today.

## 2021-01-23 ENCOUNTER — Other Ambulatory Visit: Payer: Self-pay | Admitting: Family Medicine

## 2021-01-23 ENCOUNTER — Other Ambulatory Visit: Payer: Self-pay

## 2021-01-23 MED ORDER — OMEPRAZOLE 40 MG PO CPDR: 40.0000 mg | DELAYED_RELEASE_CAPSULE | Freq: Every day | ORAL | 3 refills | Status: DC

## 2021-01-23 MED FILL — OMEPRAZOLE 40 MG CPDR: 40 | 30 days supply | Qty: 30 | Fill #0

## 2021-02-22 NOTE — Progress Notes (Unsigned)
Subjective:   Willie Parks is a 75 y.o. male who presents for Medicare Annual/Subsequent preventive examination.  I connected with Willie Parks today by telephone and verified that I am speaking with the correct person using two identifiers. Location patient: home Location provider: work Persons participating in the virtual visit: patient, provider.   I discussed the limitations, risks, security and privacy concerns of performing an evaluation and management service by telephone and the availability of in person appointments. I also discussed with the patient that there may be a patient responsible charge related to this service. The patient expressed understanding and verbally consented to this telephonic visit.    Interactive audio and video telecommunications were attempted between this provider and patient, however failed, due to patient having technical difficulties OR patient did not have access to video capability.  We continued and completed visit with audio only.      Review of Systems    N/A        Objective:    There were no vitals filed for this visit. There is no height or weight on file to calculate BMI.  Advanced Directives 10/08/2018 06/18/2017  Does Patient Have a Medical Advance Directive? No No    Current Medications (verified) Outpatient Encounter Medications as of 02/23/2021  Medication Sig  . atorvastatin (LIPITOR) 20 MG tablet 1 tab every other day.  . Calcium Carbonate-Vitamin D (CALCARB 600/D PO) Take by mouth daily.  . Fluticasone-Salmeterol (ADVAIR DISKUS) 250-50 MCG/DOSE AEPB INHALE 1 PUFF BY MOUTH INTO LUNGS TWICE DAILY  . omeprazole (PRILOSEC) 40 MG capsule Take 1 capsule (40 mg total) by mouth daily.   No facility-administered encounter medications on file as of 02/23/2021.    Allergies (verified) Penicillins   History: Past Medical History:  Diagnosis Date  . Allergy   . Asthma   . Chicken pox   . Hyperlipidemia   . Prostate cancer  [facility])    Past Surgical History:  Procedure Laterality Date  . KNEE ARTHROSCOPY Right   . WRIST SURGERY Right    Family History  Problem Relation Age of Onset  . Cancer Neg Hx   . Diabetes Neg Hx   . Prostate cancer Neg Hx   . Colon cancer Neg Hx   . Breast cancer Neg Hx    Social History   Socioeconomic History  . Marital status: Married    Spouse name: Not on file  . Number of children: Not on file  . Years of education: Not on file  . Highest education level: Not on file  Occupational History  . Occupation: retired  Tobacco Use  . Smoking status: Never Smoker  . Smokeless tobacco: Never Used  Vaping Use  . Vaping Use: Never used  Substance and Sexual Activity  . Alcohol use: Yes    Alcohol/week: 1.0 standard drink    Types: 1 Standard drinks or equivalent per week    Comment: per week  . Drug use: No  . Sexual activity: Not Currently  Other Topics Concern  . Not on file  Social History Narrative   Second marriage. Resides with wife in [place]. Retired. Has one grown child. 15 grandchildren to include his step grandchildren.    10-08-18 Unable to ask abuse questions wife with him today.   Social Determinants of Health   Financial Resource Strain: Not on file  Food Insecurity: Not on file  Transportation Needs: Not on file  Physical Activity: Not on file  Stress: Not on file  Social Connections: Not on file    Tobacco Counseling Counseling given: Not Answered   Clinical Intake:                 Diabetic?No          Activities of Daily Living In your present state of health, do you have any difficulty performing the following activities: 07/05/2020  Hearing? N  Vision? N  Difficulty concentrating or making decisions? N  Walking or climbing stairs? N  Dressing or bathing? N  Doing errands, shopping? N  Some recent data might be hidden    Patient Care Team: Martinique, Betty G, MD as PCP - General (Family Medicine)  Indicate any  recent Medical Services you may have received from other than [facility] providers in the past year (date may be approximate).     Assessment:   This is a routine wellness examination for Willie Parks.  Hearing/Vision screen No exam data present  Dietary issues and exercise activities discussed:    Goals   None    Depression Screen PHQ 2/9 Scores 11/09/2020 05/08/2020 04/29/2018 06/18/2017 10/08/2016  PHQ - 2 Score 0 0 0 0 0    Fall Risk Fall Risk  11/09/2020 04/29/2018 06/18/2017 10/08/2016  Falls in the past year? 0 No No No  Number falls in past yr: 0 - - -  Injury with Fall? 0 - - -  Follow up Education provided - - -    FALL RISK PREVENTION PERTAINING TO THE HOME:  Any stairs in or around the home? {YES/NO:21197} If so, are there any without handrails? No  Home free of loose throw rugs in walkways, pet beds, electrical cords, etc? Yes  Adequate lighting in your home to reduce risk of falls? Yes   ASSISTIVE DEVICES UTILIZED TO PREVENT FALLS:  Life alert? {YES/NO:21197} Use of a cane, walker or w/c? {YES/NO:21197} Grab bars in the bathroom? {YES/NO:21197} Shower chair or bench in shower? {YES/NO:21197} Elevated toilet seat or a handicapped toilet? {YES/NO:21197}  Cognitive Function:        Immunizations Immunization History  Administered Date(s) Administered  . Fluad Quad(high Dose 65+) 09/24/2019, 11/06/2020  . Influenza, High Dose Seasonal PF 01/30/2018  . Influenza,inj,Quad PF,6+ Mos 01/22/2019  . PFIZER(Purple Top)SARS-COV-2 Vaccination 03/23/2020, 04/18/2020, 12/30/2020    TDAP status: Due, Education has been provided regarding the importance of this vaccine. Advised may receive this vaccine at local pharmacy or Health Dept. Aware to provide a copy of the vaccination record if obtained from local pharmacy or Health Dept. Verbalized acceptance and understanding.  Flu Vaccine status: Up to date  Pneumococcal vaccine status: Due, Education has been provided regarding  the importance of this vaccine. Advised may receive this vaccine at local pharmacy or Health Dept. Aware to provide a copy of the vaccination record if obtained from local pharmacy or Health Dept. Verbalized acceptance and understanding.  Covid-19 vaccine status: Completed vaccines  Qualifies for Shingles Vaccine? Yes   Zostavax completed No   Shingrix Completed?: No.    Education has been provided regarding the importance of this vaccine. Patient has been advised to call insurance company to determine out of pocket expense if they have not yet received this vaccine. Advised may also receive vaccine at local pharmacy or Health Dept. Verbalized acceptance and understanding.  Screening Tests Health Maintenance  Topic Date Due  . COLONOSCOPY (Pts 45-43yrs Insurance coverage will need to be confirmed)  05/08/2021 (Originally 10/28/1990)  . COVID-19 Vaccine (4 - Booster for Pfizer series) 06/29/2021  .  INFLUENZA VACCINE  Completed  . Hepatitis C Screening  Completed  . HPV VACCINES  Aged Out  . TETANUS/TDAP  Discontinued  . PNA vac Low Risk Adult  Discontinued    Health Maintenance  There are no preventive care reminders to display for this patient.  {Colorectal cancer screening:2101809}  Lung Cancer Screening: (Low Dose CT Chest recommended if Age 78-80 years, 30 pack-year currently smoking OR have quit w/in 15years.) does not qualify.   Lung Cancer Screening Referral: N/A   Additional Screening:  Hepatitis C Screening: does qualify; Completed 06/23/2018  Vision Screening: Recommended annual ophthalmology exams for early detection of glaucoma and other disorders of the eye. Is the patient up to date with their annual eye exam?  {YES/NO:21197} Who is the provider or what is the name of the office in which the patient attends annual eye exams? *** If pt is not established with a provider, would they like to be referred to a provider to establish care? {YES/NO:21197}.   Dental  Screening: Recommended annual dental exams for proper oral hygiene  Community Resource Referral / Chronic Care Management: CRR required this visit?  No   CCM required this visit?  No      Plan:     I have personally reviewed and noted the following in the patient's chart:   . Medical and social history . Use of alcohol, tobacco or illicit drugs  . Current medications and supplements . Functional ability and status . Nutritional status . Physical activity . Advanced directives . List of other physicians . Hospitalizations, surgeries, and ER visits in previous 12 months . Vitals . Screenings to include cognitive, depression, and falls . Referrals and appointments  In addition, I have reviewed and discussed with patient certain preventive protocols, quality metrics, and best practice recommendations. A written personalized care plan for preventive services as well as general preventive health recommendations were provided to patient.     Ofilia Neas, LPN   07/23/8298   Nurse Notes: None

## 2021-02-23 ENCOUNTER — Ambulatory Visit: Payer: Medicare HMO

## 2021-02-23 DIAGNOSIS — Z0000 Encounter for general adult medical examination without abnormal findings: Secondary | ICD-10-CM

## 2021-02-27 ENCOUNTER — Other Ambulatory Visit (HOSPITAL_COMMUNITY): Payer: Self-pay | Admitting: Physician Assistant

## 2021-02-27 DIAGNOSIS — J40 Bronchitis, not specified as acute or chronic: Secondary | ICD-10-CM | POA: Diagnosis not present

## 2021-02-27 MED FILL — ADVAIR 250/50 DISKUS: 250-50 | 30 days supply | Qty: 60 | Fill #3

## 2021-02-27 MED FILL — DOXYCYCLINE MONO 100 MG CAP: 100 | 10 days supply | Qty: 20 | Fill #0

## 2021-02-27 MED FILL — OMEPRAZOLE 40 MG CPDR: 40 | 30 days supply | Qty: 30 | Fill #1

## 2021-03-29 ENCOUNTER — Other Ambulatory Visit (HOSPITAL_COMMUNITY): Payer: Self-pay

## 2021-04-04 ENCOUNTER — Other Ambulatory Visit (HOSPITAL_COMMUNITY): Payer: Self-pay

## 2021-04-04 MED FILL — Atorvastatin Calcium Tab 20 MG (Base Equivalent): ORAL | 90 days supply | Qty: 45 | Fill #0 | Status: AC

## 2021-05-15 DIAGNOSIS — Z8546 Personal history of malignant neoplasm of prostate: Secondary | ICD-10-CM | POA: Diagnosis not present

## 2021-05-23 DIAGNOSIS — Z8546 Personal history of malignant neoplasm of prostate: Secondary | ICD-10-CM | POA: Diagnosis not present

## 2021-05-23 DIAGNOSIS — N3041 Irradiation cystitis with hematuria: Secondary | ICD-10-CM | POA: Diagnosis not present

## 2021-05-23 DIAGNOSIS — N401 Enlarged prostate with lower urinary tract symptoms: Secondary | ICD-10-CM | POA: Diagnosis not present

## 2021-05-23 DIAGNOSIS — R3915 Urgency of urination: Secondary | ICD-10-CM | POA: Diagnosis not present

## 2021-05-31 ENCOUNTER — Telehealth: Payer: Self-pay | Admitting: Family Medicine

## 2021-05-31 ENCOUNTER — Other Ambulatory Visit (HOSPITAL_COMMUNITY): Payer: Self-pay

## 2021-05-31 MED ORDER — FLUTICASONE-SALMETEROL 250-50 MCG/ACT IN AEPB
INHALATION_SPRAY | RESPIRATORY_TRACT | 0 refills | Status: DC
  Filled 2021-05-31: qty 60, 30d supply, fill #0

## 2021-05-31 NOTE — Telephone Encounter (Signed)
Pts spouse is calling in stating that pt is out of Rx fluticasone-salmeterol (ADVAIR) 250-50 MCG  Pharm:  Uncertain

## 2021-06-01 ENCOUNTER — Other Ambulatory Visit (HOSPITAL_COMMUNITY): Payer: Self-pay

## 2021-06-01 MED ORDER — FLUTICASONE-SALMETEROL 250-50 MCG/ACT IN AEPB
INHALATION_SPRAY | RESPIRATORY_TRACT | 2 refills | Status: DC
  Filled 2021-06-01: qty 180, fill #0

## 2021-06-01 NOTE — Telephone Encounter (Signed)
Rx sent in as requested. 

## 2021-06-18 ENCOUNTER — Encounter: Payer: Self-pay | Admitting: Family Medicine

## 2021-06-18 ENCOUNTER — Other Ambulatory Visit: Payer: Self-pay

## 2021-06-18 ENCOUNTER — Other Ambulatory Visit (HOSPITAL_COMMUNITY): Payer: Self-pay

## 2021-06-18 ENCOUNTER — Ambulatory Visit (INDEPENDENT_AMBULATORY_CARE_PROVIDER_SITE_OTHER): Payer: Medicare HMO | Admitting: Family Medicine

## 2021-06-18 VITALS — BP 128/70 | HR 65 | Resp 16 | Ht 72.0 in | Wt 185.0 lb

## 2021-06-18 DIAGNOSIS — I10 Essential (primary) hypertension: Secondary | ICD-10-CM

## 2021-06-18 DIAGNOSIS — J4520 Mild intermittent asthma, uncomplicated: Secondary | ICD-10-CM

## 2021-06-18 MED ORDER — FLUTICASONE-SALMETEROL 250-50 MCG/ACT IN AEPB
INHALATION_SPRAY | RESPIRATORY_TRACT | 2 refills | Status: DC
  Filled 2021-06-18: qty 180, 90d supply, fill #0
  Filled 2021-08-01: qty 60, 30d supply, fill #0
  Filled 2021-09-05: qty 60, 30d supply, fill #1
  Filled 2021-11-22: qty 60, 30d supply, fill #2
  Filled 2022-01-28: qty 60, 30d supply, fill #3
  Filled 2022-03-19: qty 60, 30d supply, fill #4
  Filled 2022-06-01: qty 60, 30d supply, fill #5
  Filled 2022-06-18 (×2): qty 60, 30d supply, fill #6

## 2021-06-18 NOTE — Patient Instructions (Addendum)
A few things to remember from today's visit:   Mild intermittent asthma, uncomplicated  If you need refills please call your pharmacy. Do not use My Chart to request refills or for acute issues that need immediate attention.   No changes today. Next appt with me for your physical.   Please be sure medication list is accurate. If a new problem present, please set up appointment sooner than planned today.

## 2021-06-18 NOTE — Assessment & Plan Note (Signed)
Problem is well controlled. Continue Advair 250-50 mcg 1 puff twice daily and albuterol inhaler 2 puff every 4-6 hours as needed. We discussed some side effects of medications.

## 2021-06-18 NOTE — Progress Notes (Signed)
HPI: [name] is a 75 y.o. male, who is here today with his wife for 6 months follow up.   He was last seen on 01/22/2021 because of hiccups, problem has resolved.  It seems like since his last visit he was evaluated and treated for bronchitis at [facility]. He does not remember this visit and his wife states he has not been in urgent care or taking antibiotics for respiratory tract infection.  Asthma has been well controlled with Advair 250-50 mcg twice daily. He has not needed his albuterol in months. Negative for fever, chills, cough, wheezing, dyspnea, or CP. Medication has been well-tolerated.  He was also evaluated by orthopedics, Dr. Erlinda Hong, right knee pain, improved after intra-articular knee injection.  Hypertension: He is on nonpharmacologic treatment. Has been checking BP regularly. Negative for unusual headache, focal neurologic deficit, or edema.  Lab Results  Component Value Date   CREATININE 1.25 01/22/2021   BUN 13 01/22/2021   NA 139 01/22/2021   K 4.6 01/22/2021   CL 102 01/22/2021   CO2 32 01/22/2021   Review of Systems  Constitutional:  Negative for activity change, appetite change and fatigue.  HENT:  Negative for mouth sores, nosebleeds and sore throat.   Gastrointestinal:  Negative for abdominal pain, nausea and vomiting.  Genitourinary:  Negative for decreased urine volume and dysuria.  Musculoskeletal:  Negative for gait problem and myalgias.  Skin:  Negative for pallor and rash.  Neurological:  Negative for syncope, facial asymmetry and weakness.  Rest of ROS, see pertinent positives sand negatives in HPI  Current Outpatient Medications on File Prior to Visit  Medication Sig Dispense Refill   atorvastatin (LIPITOR) 20 MG tablet TAKE 1 TABLET BY MOUTH EVERY OTHER DAY 90 tablet 3   Calcium Carbonate-Vitamin D (CALCARB 600/D PO) Take by mouth daily.     omeprazole (PRILOSEC) 40 MG capsule TAKE 1 CAPSULE BY MOUTH ONCE A DAY 30 capsule 3    No current facility-administered medications on file prior to visit.   Past Medical History:  Diagnosis Date   Allergy    Asthma    Chicken pox    Hyperlipidemia    Prostate cancer ([place])    Allergies  Allergen Reactions   Penicillins Other (See Comments)    Dizziness     Social History   Socioeconomic History   Marital status: Married    Spouse name: Not on file   Number of children: Not on file   Years of education: Not on file   Highest education level: Not on file  Occupational History   Occupation: retired  Tobacco Use   Smoking status: Never   Smokeless tobacco: Never  Vaping Use   Vaping Use: Never used  Substance and Sexual Activity   Alcohol use: Yes    Alcohol/week: 1.0 standard drink    Types: 1 Standard drinks or equivalent per week    Comment: per week   Drug use: No   Sexual activity: Not Currently  Other Topics Concern   Not on file  Social History Narrative   Second marriage. Resides with wife in [place]. Retired. Has one grown child. 15 grandchildren to include his step grandchildren.    10-08-18 Unable to ask abuse questions wife with him today.   Social Determinants of Health   Financial Resource Strain: Not on file  Food Insecurity: Not on file  Transportation Needs: Not on file  Physical Activity: Not on file  Stress: Not on  file  Social Connections: Not on file   Vitals:   06/18/21 0650  BP: 128/70  Pulse: 65  Resp: 16  SpO2: 99%   Body mass index is 25.09 kg/m.  Physical Exam Vitals and nursing note reviewed.  Constitutional:      General: He is not in acute distress.    Appearance: He is well-developed.  HENT:     Head: Normocephalic and atraumatic.     Mouth/Throat:     Mouth: Mucous membranes are moist.     Dentition: Has dentures.  Eyes:     Conjunctiva/sclera: Conjunctivae normal.  Cardiovascular:     Rate and Rhythm: Normal rate and regular rhythm.     Pulses:          Posterior tibial pulses are 2+ on  the right side and 2+ on the left side.     Heart sounds: No murmur heard. Pulmonary:     Effort: Pulmonary effort is normal. No respiratory distress.     Breath sounds: Normal breath sounds.  Abdominal:     Palpations: Abdomen is soft. There is no hepatomegaly or mass.     Tenderness: There is no abdominal tenderness.  Skin:    General: Skin is warm.     Findings: No erythema or rash.  Neurological:     Mental Status: He is alert and oriented to person, place, and time.     Gait: Gait normal.  Psychiatric:     Comments: Well groomed, good eye contact.   ASSESSMENT AND PLAN:  [name] was seen today for 6 months follow-up.  No orders of the defined types were placed in this encounter.  Essential hypertension BP adequately controlled. Continue nonpharmacologic treatment. Recommend monitoring BP regularly.  Mild intermittent asthma, uncomplicated Problem is well controlled. Continue Advair 250-50 mcg 1 puff twice daily and albuterol inhaler 2 puff every 4-6 hours as needed. We discussed some side effects of medications.  Return in about 6 months (around 12/18/2021) for cpe. Needs AWV .   Kaelea Gathright G. [place], MD  [facility]. [place] office.  A few things to remember from today's visit:   Mild intermittent asthma, uncomplicated  If you need refills please call your pharmacy. Do not use My Chart to request refills or for acute issues that need immediate attention.   No changes today. Next appt with me for your physical.   Please be sure medication list is accurate. If a new problem present, please set up appointment sooner than planned today.

## 2021-06-18 NOTE — Assessment & Plan Note (Signed)
BP adequately controlled. Continue nonpharmacologic treatment. Recommend monitoring BP regularly.

## 2021-06-26 ENCOUNTER — Other Ambulatory Visit (HOSPITAL_COMMUNITY): Payer: Self-pay

## 2021-07-04 ENCOUNTER — Other Ambulatory Visit (HOSPITAL_COMMUNITY): Payer: Self-pay

## 2021-07-18 ENCOUNTER — Telehealth: Payer: Self-pay | Admitting: Family Medicine

## 2021-07-18 NOTE — Telephone Encounter (Signed)
Left message for patient to call back and schedule Medicare Annual Wellness Visit (AWV) either virtually or in office.   Last AWV 06/18/17 please schedule at anytime with [facility] Nurse Health Advisor 1 or 2   This should be a 45 minute visit.

## 2021-08-01 ENCOUNTER — Other Ambulatory Visit (HOSPITAL_COMMUNITY): Payer: Self-pay

## 2021-08-14 ENCOUNTER — Other Ambulatory Visit (HOSPITAL_COMMUNITY): Payer: Self-pay

## 2021-08-15 ENCOUNTER — Other Ambulatory Visit (HOSPITAL_COMMUNITY): Payer: Self-pay

## 2021-08-15 ENCOUNTER — Other Ambulatory Visit: Payer: Self-pay | Admitting: Family Medicine

## 2021-08-15 DIAGNOSIS — G459 Transient cerebral ischemic attack, unspecified: Secondary | ICD-10-CM

## 2021-08-15 DIAGNOSIS — E785 Hyperlipidemia, unspecified: Secondary | ICD-10-CM

## 2021-08-15 MED ORDER — ATORVASTATIN CALCIUM 20 MG PO TABS
ORAL_TABLET | ORAL | 3 refills | Status: DC
  Filled 2021-08-15: qty 45, 90d supply, fill #0
  Filled 2022-01-02 – 2022-03-25 (×2): qty 45, 90d supply, fill #1
  Filled 2022-06-18: qty 45, 90d supply, fill #2

## 2021-08-16 ENCOUNTER — Other Ambulatory Visit (HOSPITAL_COMMUNITY): Payer: Self-pay

## 2021-08-28 ENCOUNTER — Ambulatory Visit (INDEPENDENT_AMBULATORY_CARE_PROVIDER_SITE_OTHER): Payer: Medicare HMO

## 2021-08-28 ENCOUNTER — Other Ambulatory Visit: Payer: Self-pay

## 2021-08-28 DIAGNOSIS — Z1211 Encounter for screening for malignant neoplasm of colon: Secondary | ICD-10-CM

## 2021-08-28 DIAGNOSIS — Z0000 Encounter for general adult medical examination without abnormal findings: Secondary | ICD-10-CM

## 2021-08-28 NOTE — Patient Instructions (Signed)
[name] , Thank you for taking time to come for your Medicare Wellness Visit. I appreciate your ongoing commitment to your health goals. Please review the following plan we discussed and let me know if I can assist you in the future.   Screening recommendations/referrals: Colonoscopy: Order placed 08/28/21 Recommended yearly ophthalmology/optometry visit for glaucoma screening and checkup Recommended yearly dental visit for hygiene and checkup  Vaccinations: Influenza vaccine: Due Pneumococcal vaccine: Declined  Tdap vaccine: Declined and discussed Shingles vaccine: Shingrix discussed. Please contact your pharmacy for coverage information.    Covid-19: Completed 4/1, 04/18/20 & 12/30/20  Advanced directives: Advance directive discussed with you today. I have provided a copy for you to complete at home and have notarized. Once this is complete please bring a copy in to our office so we can scan it into your chart.  Conditions/risks identified: None at this time   Next appointment: Follow up in one year for your annual wellness visit.   Preventive Care 31 Years and Older, Male Preventive care refers to lifestyle choices and visits with your health care provider that can promote health and wellness. What does preventive care include? A yearly physical exam. This is also called an annual well check. Dental exams once or twice a year. Routine eye exams. Ask your health care provider how often you should have your eyes checked. Personal lifestyle choices, including: Daily care of your teeth and gums. Regular physical activity. Eating a healthy diet. Avoiding tobacco and drug use. Limiting alcohol use. Practicing safe sex. Taking low doses of aspirin every day. Taking vitamin and mineral supplements as recommended by your health care provider. What happens during an annual well check? The services and screenings done by your health care provider during your annual well check will depend  on your age, overall health, lifestyle risk factors, and family history of disease. Counseling  Your health care provider may ask you questions about your: Alcohol use. Tobacco use. Drug use. Emotional well-being. Home and relationship well-being. Sexual activity. Eating habits. History of falls. Memory and ability to understand (cognition). Work and work Statistician. Screening  You may have the following tests or measurements: Height, weight, and BMI. Blood pressure. Lipid and cholesterol levels. These may be checked every 5 years, or more frequently if you are over 85 years old. Skin check. Lung cancer screening. You may have this screening every year starting at age 24 if you have a 30-pack-year history of smoking and currently smoke or have quit within the past 15 years. Fecal occult blood test (FOBT) of the stool. You may have this test every year starting at age 7. Flexible sigmoidoscopy or colonoscopy. You may have a sigmoidoscopy every 5 years or a colonoscopy every 10 years starting at age 87. Prostate cancer screening. Recommendations will vary depending on your family history and other risks. Hepatitis C blood test. Hepatitis B blood test. Sexually transmitted disease (STD) testing. Diabetes screening. This is done by checking your blood sugar (glucose) after you have not eaten for a while (fasting). You may have this done every 1-3 years. Abdominal aortic aneurysm (AAA) screening. You may need this if you are a current or former smoker. Osteoporosis. You may be screened starting at age 45 if you are at high risk. Talk with your health care provider about your test results, treatment options, and if necessary, the need for more tests. Vaccines  Your health care provider may recommend certain vaccines, such as: Influenza vaccine. This is recommended every year.  Tetanus, diphtheria, and acellular pertussis (Tdap, Td) vaccine. You may need a Td booster every 10  years. Zoster vaccine. You may need this after age 58. Pneumococcal 13-valent conjugate (PCV13) vaccine. One dose is recommended after age 23. Pneumococcal polysaccharide (PPSV23) vaccine. One dose is recommended after age 25. Talk to your health care provider about which screenings and vaccines you need and how often you need them. This information is not intended to replace advice given to you by your health care provider. Make sure you discuss any questions you have with your health care provider. Document Released: 01/05/2016 Document Revised: 08/28/2016 Document Reviewed: 10/10/2015 Elsevier Interactive Patient Education  2017 [place] Prevention in the Home Falls can cause injuries. They can happen to people of all ages. There are many things you can do to make your home safe and to help prevent falls. What can I do on the outside of my home? Regularly fix the edges of walkways and driveways and fix any cracks. Remove anything that might make you trip as you walk through a door, such as a raised step or threshold. Trim any bushes or trees on the path to your home. Use bright outdoor lighting. Clear any walking paths of anything that might make someone trip, such as rocks or tools. Regularly check to see if handrails are loose or broken. Make sure that both sides of any steps have handrails. Any raised decks and porches should have guardrails on the edges. Have any leaves, snow, or ice cleared regularly. Use sand or salt on walking paths during winter. Clean up any spills in your garage right away. This includes oil or grease spills. What can I do in the bathroom? Use night lights. Install grab bars by the toilet and in the tub and shower. Do not use towel bars as grab bars. Use non-skid mats or decals in the tub or shower. If you need to sit down in the shower, use a plastic, non-slip stool. Keep the floor dry. Clean up any water that spills on the floor as soon as it  happens. Remove soap buildup in the tub or shower regularly. Attach bath mats securely with double-sided non-slip rug tape. Do not have throw rugs and other things on the floor that can make you trip. What can I do in the bedroom? Use night lights. Make sure that you have a light by your bed that is easy to reach. Do not use any sheets or blankets that are too big for your bed. They should not hang down onto the floor. Have a firm chair that has side arms. You can use this for support while you get dressed. Do not have throw rugs and other things on the floor that can make you trip. What can I do in the kitchen? Clean up any spills right away. Avoid walking on wet floors. Keep items that you use a lot in easy-to-reach places. If you need to reach something above you, use a strong step stool that has a grab bar. Keep electrical cords out of the way. Do not use floor polish or wax that makes floors slippery. If you must use wax, use non-skid floor wax. Do not have throw rugs and other things on the floor that can make you trip. What can I do with my stairs? Do not leave any items on the stairs. Make sure that there are handrails on both sides of the stairs and use them. Fix handrails that are broken or loose.  Make sure that handrails are as long as the stairways. Check any carpeting to make sure that it is firmly attached to the stairs. Fix any carpet that is loose or worn. Avoid having throw rugs at the top or bottom of the stairs. If you do have throw rugs, attach them to the floor with carpet tape. Make sure that you have a light switch at the top of the stairs and the bottom of the stairs. If you do not have them, ask someone to add them for you. What else can I do to help prevent falls? Wear shoes that: Do not have high heels. Have rubber bottoms. Are comfortable and fit you well. Are closed at the toe. Do not wear sandals. If you use a stepladder: Make sure that it is fully opened.  Do not climb a closed stepladder. Make sure that both sides of the stepladder are locked into place. Ask someone to hold it for you, if possible. Clearly mark and make sure that you can see: Any grab bars or handrails. First and last steps. Where the edge of each step is. Use tools that help you move around (mobility aids) if they are needed. These include: Canes. Walkers. Scooters. Crutches. Turn on the lights when you go into a dark area. Replace any light bulbs as soon as they burn out. Set up your furniture so you have a clear path. Avoid moving your furniture around. If any of your floors are uneven, fix them. If there are any pets around you, be aware of where they are. Review your medicines with your doctor. Some medicines can make you feel dizzy. This can increase your chance of falling. Ask your doctor what other things that you can do to help prevent falls. This information is not intended to replace advice given to you by your health care provider. Make sure you discuss any questions you have with your health care provider. Document Released: 10/05/2009 Document Revised: 05/16/2016 Document Reviewed: 01/13/2015 Elsevier Interactive Patient Education  2017 [employer].

## 2021-08-28 NOTE — Addendum Note (Signed)
Addended by: Willette Brace on: 08/28/2021 08:42 AM   Modules accepted: Orders, SmartSet

## 2021-08-28 NOTE — Progress Notes (Addendum)
Virtual Visit via Telephone Note  I connected with  Willie Parks on 08/28/21 at  8:00 AM EDT by telephone and verified that I am speaking with the correct person using two identifiers.  Medicare Annual Wellness visit completed telephonically due to Covid-19 pandemic.   Persons participating in this call: This Health Coach and this patient.   Location: Patient: Home Provider: Office   I discussed the limitations, risks, security and privacy concerns of performing an evaluation and management service by telephone and the availability of in person appointments. The patient expressed understanding and agreed to proceed.  Unable to perform video visit due to video visit attempted and failed and/or patient does not have video capability.   Some vital signs may be absent or patient reported.   Willette Brace, LPN   Subjective:   Willie Parks is a 75 y.o. male who presents for Medicare Annual/Subsequent preventive examination.  Review of Systems     Cardiac Risk Factors include: advanced age (>43mn, >>31women);dyslipidemia;hypertension;male gender     Objective:    There were no vitals filed for this visit. There is no height or weight on file to calculate BMI.  Advanced Directives 08/28/2021 10/08/2018 06/18/2017  Does Patient Have a Medical Advance Directive? No No No  Would patient like information on creating a medical advance directive? Yes (MAU/Ambulatory/Procedural Areas - Information given) - -    Current Medications (verified) Outpatient Encounter Medications as of 08/28/2021  Medication Sig   atorvastatin (LIPITOR) 20 MG tablet TAKE 1 TABLET BY MOUTH EVERY OTHER DAY   Calcium Carbonate-Vitamin D (CALCARB 600/D PO) Take by mouth daily.   fluticasone-salmeterol (ADVAIR) 250-50 MCG/ACT AEPB Inhale 1 puff into the lungs twice daily   [DISCONTINUED] omeprazole (PRILOSEC) 40 MG capsule TAKE 1 CAPSULE BY MOUTH ONCE A DAY (Patient not taking: Reported on 08/28/2021)   No  facility-administered encounter medications on file as of 08/28/2021.    Allergies (verified) Penicillins   History: Past Medical History:  Diagnosis Date   Allergy    Asthma    Chicken pox    Hyperlipidemia    Prostate cancer ([place]    Past Surgical History:  Procedure Laterality Date   KNEE ARTHROSCOPY Right    WRIST SURGERY Right    Family History  Problem Relation Age of Onset   Cancer Neg Hx    Diabetes Neg Hx    Prostate cancer Neg Hx    Colon cancer Neg Hx    Breast cancer Neg Hx    Social History   Socioeconomic History   Marital status: Married    Spouse name: Not on file   Number of children: Not on file   Years of education: Not on file   Highest education level: Not on file  Occupational History   Occupation: retired  Tobacco Use   Smoking status: Never   Smokeless tobacco: Never  Vaping Use   Vaping Use: Never used  Substance and Sexual Activity   Alcohol use: Not Currently    Alcohol/week: 1.0 standard drink    Types: 1 Standard drinks or equivalent per week    Comment: per week   Drug use: No   Sexual activity: Not Currently  Other Topics Concern   Not on file  Social History Narrative   Second marriage. Res[place] with [place] in [place]. Has one grown child. 15 grandchildren to include his step grandchildren.    10-08-18 Unable to ask abuse questions wife with him today.   Social  Determinants of Health   Financial Resource Strain: Low Risk    Difficulty of Paying Living Expenses: Not hard at all  Food Insecurity: No Food Insecurity   Worried About Garden in the Last Year: Never true   Ran Out of Food in the Last Year: Never true  Transportation Needs: No Transportation Needs   Lack of Transportation (Medical): No   Lack of Transportation (Non-Medical): No  Physical Activity: Inactive   Days of Exercise per Week: 0 days   Minutes of Exercise per Session: 0 min  Stress: No Stress Concern Present   Feeling of Stress :  Not at all  Social Connections: Moderately Isolated   Frequency of Communication with Friends and Family: More than three times a week   Frequency of Social Gatherings with Friends and Family: More than three times a week   Attends Religious Services: Never   Printmaker: Not on file   Attends Archivist Meetings: Never   Marital Status: Married    Tobacco Counseling Counseling given: Not Answered   Clinical Intake:  Pre-visit preparation completed: Yes  Pain : No/denies pain     BMI - recorded: 25.09 Nutritional Status: BMI 25 -29 Overweight Nutritional Risks: None Diabetes: No  How often do you need to have someone help you when you read instructions, pamphlets, or other written materials from your doctor or pharmacy?: 1 - Never  Diabetic?No  Interpreter Needed?: No  Information entered by :: Charlott Rakes, LPN   Activities of Daily Living In your present state of health, do you have any difficulty performing the following activities: 08/28/2021  Hearing? N  Vision? N  Difficulty concentrating or making decisions? N  Walking or climbing stairs? N  Dressing or bathing? N  Doing errands, shopping? N  Preparing Food and eating ? N  Using the Toilet? N  In the past six months, have you accidently leaked urine? N  Do you have problems with loss of bowel control? N  Managing your Medications? N  Managing your Finances? N  Housekeeping or managing your Housekeeping? N  Some recent data might be hidden    Patient Care Team: Martin[place], Betty G, MD as PCP - General (Family Medicine)  Indicate any recent Medical Services you may have received from other than [facility] providers in the past year (date may be approximate).     Assessment:   This is a routine wellness examination for Willie Parks.  Hearing/Vision screen Hearing Screening - Comments:: Pt denies any hearing issues Vision Screening - Comments:: [facility]   Dietary issues and exercise activities discussed: Current Exercise Habits: The patient does not participate in regular exercise at present   Goals Addressed             This Visit's Progress    Patient Stated       None at this time        Depression Screen PHQ 2/9 Scores 08/28/2021 11/09/2020 05/08/2020 04/29/2018 06/18/2017 10/08/2016  PHQ - 2 Score 0 0 0 0 0 0    Fall Risk Fall Risk  08/28/2021 11/09/2020 04/29/2018 06/18/2017 10/08/2016  Falls in the past year? 0 0 No No No  Number falls in past yr: 0 0 - - -  Injury with Fall? 0 0 - - -  Risk for fall due to : Impaired vision - - - -  Follow up Falls prevention discussed Education  provided - - -    FALL RISK PREVENTION PERTAINING TO THE HOME:  Any stairs in or around the home? Yes  If so, are there any without handrails? No  Home free of loose throw rugs in walkways, pet beds, electrical cords, etc? Yes  Adequate lighting in your home to reduce risk of falls? Yes   ASSISTIVE DEVICES UTILIZED TO PREVENT FALLS:  Life alert? No  Use of a cane, walker or w/c? No  Grab bars in the bathroom? Yes  Shower chair or bench in shower? No  Elevated toilet seat or a handicapped toilet? No   TIMED UP AND GO:  Was the test performed? No .  Cognitive Function:     6CIT Screen 08/28/2021  What Year? 0 points  What month? 0 points  What time? 0 points  Count back from 20 0 points  Months in reverse 4 points  Repeat phrase 6 points  Total Score 10    Immunizations Immunization History  Administered Date(s) Administered   Fluad Quad(high Dose 65+) 09/24/2019, 11/06/2020   Influenza, High Dose Seasonal PF 01/30/2018   Influenza,inj,Quad PF,6+ Mos 01/22/2019   PFIZER(Purple Top)SARS-COV-2 Vaccination 03/23/2020, 04/18/2020, 12/30/2020      Flu Vaccine status: Due, Education has been provided regarding the importance of this vaccine. Advised may receive this vaccine at local pharmacy or Health Dept. Aware  to provide a copy of the vaccination record if obtained from local pharmacy or Health Dept. Verbalized acceptance and understanding.  Pneumococcal vaccine status: Declined,  Education has been provided regarding the importance of this vaccine but patient still declined. Advised may receive this vaccine at local pharmacy or Health Dept. Aware to provide a copy of the vaccination record if obtained from local pharmacy or Health Dept. Verbalized acceptance and understanding.   Covid-19 vaccine status: Completed vaccines  Qualifies for Shingles Vaccine? Yes   Zostavax completed No   Shingrix Completed?: No.    Education has been provided regarding the importance of this vaccine. Patient has been advised to call insurance company to determine out of pocket expense if they have not yet received this vaccine. Advised may also receive vaccine at local pharmacy or Health Dept. Verbalized acceptance and understanding.  Screening Tests Health Maintenance  Topic Date Due   Zoster Vaccines- Shingrix (1 of 2) Never done   COLONOSCOPY (Pts 45-47yr Insurance coverage will need to be confirmed)  Never done   COVID-19 Vaccine (4 - Booster for [employer]) 03/30/2021   INFLUENZA VACCINE  07/23/2021   Hepatitis C Screening  Completed   HPV VACCINES  Aged Out   TETANUS/TDAP  Discontinued   PNA vac Low Risk Adult  Discontinued    Health Maintenance  Health Maintenance Due  Topic Date Due   Zoster Vaccines- Shingrix (1 of 2) Never done   COLONOSCOPY (Pts 45-434yrInsurance coverage will need to be confirmed)  Never done   COVID-19 Vaccine (4 - Booster for [employer]) 03/30/2021   INFLUENZA VACCINE  07/23/2021    Colorectal cancer screening: Referral to GI placed 08/28/21. Pt aware the office will call re: appt.  Additional Screening:  Hepatitis C Screening:  Completed 06/23/18  Vision Screening: Recommended annual ophthalmology exams for early detection of glaucoma and other disorders of the  eye. Is the patient up to date with their annual eye exam?  Yes  Who is the provider or what is the name of the office in which the patient attends annual eye exams? [facility] If pt  is not established with a provider, would they like to be referred to a provider to establish care? No .   Dental Screening: Recommended annual dental exams for proper oral hygiene  Community Resource Referral / Chronic Care Management: CRR required this visit?  No   CCM required this visit?  No      Plan:     I have personally reviewed and noted the following in the patient's chart:   Medical and social history Use of alcohol, tobacco or illicit drugs  Current medications and supplements including opioid prescriptions. Patient is not currently taking opioid prescriptions. Functional ability and status Nutritional status Physical activity Advanced directives List of other physicians Hospitalizations, surgeries, and ER visits in previous 12 months Vitals Screenings to include cognitive, depression, and falls Referrals and appointments  In addition, I have reviewed and discussed with patient certain preventive protocols, quality metrics, and best practice recommendations. A written personalized care plan for preventive services as well as general preventive health recommendations were provided to patient.     Willette Brace, LPN   [id number]   Nurse Notes: Willie Parks

## 2021-09-05 ENCOUNTER — Other Ambulatory Visit (HOSPITAL_COMMUNITY): Payer: Self-pay

## 2021-09-12 ENCOUNTER — Ambulatory Visit (INDEPENDENT_AMBULATORY_CARE_PROVIDER_SITE_OTHER): Payer: Medicare HMO | Admitting: Family Medicine

## 2021-09-12 ENCOUNTER — Other Ambulatory Visit (HOSPITAL_COMMUNITY): Payer: Self-pay

## 2021-09-12 ENCOUNTER — Encounter: Payer: Self-pay | Admitting: Family Medicine

## 2021-09-12 ENCOUNTER — Other Ambulatory Visit: Payer: Self-pay

## 2021-09-12 VITALS — BP 124/70 | HR 66 | Temp 97.9°F | Resp 16 | Ht 72.0 in | Wt 183.0 lb

## 2021-09-12 DIAGNOSIS — J4520 Mild intermittent asthma, uncomplicated: Secondary | ICD-10-CM

## 2021-09-12 DIAGNOSIS — R059 Cough, unspecified: Secondary | ICD-10-CM

## 2021-09-12 DIAGNOSIS — K5900 Constipation, unspecified: Secondary | ICD-10-CM

## 2021-09-12 MED ORDER — BENZONATATE 100 MG PO CAPS
100.0000 mg | ORAL_CAPSULE | Freq: Two times a day (BID) | ORAL | 0 refills | Status: AC | PRN
  Filled 2021-09-12: qty 20, 10d supply, fill #0

## 2021-09-12 NOTE — Patient Instructions (Addendum)
A few things to remember from today's visit:  Mild intermittent asthma, uncomplicated  Cough - Plan: benzonatate (TESSALON) 100 MG capsule  Constipation, unspecified constipation type  If you need refills please call your pharmacy. Do not use My Chart to request refills or for acute issues that need immediate attention.   It seems like cough has improved and I do not hear pneumonia today, so we can wait on chest X ray. Monitor for new symptoms. Plain Mucinex may help.  Miralax at bedtime daily as needed may help with constipation. Adequate fiber and fluid intake. Benefiber 1 tsp 2 times daily can be an option for fiber.  Please be sure medication list is accurate. If a new problem present, please set up appointment sooner than planned today.

## 2021-09-12 NOTE — Progress Notes (Signed)
ACUTE VISIT Chief Complaint  Patient presents with   Abdominal Pain    resolved   Cough    Ongoing, has not gone away, covid test negative   HPI: Willie Parks is a 75 y.o. male with history of prostate cancer, TIA, asthma, and hypertension here today with his wife complaining of about a week of productive cough.  He was also having periumbilical abdominal pain. Constipation, bowel movements every 2 to 3 days. Last bowel movement was yesterday. This is a chronic problem and prune juice seems to help.  Abdominal Pain This is a new problem. The current episode started 1 to 4 weeks ago. The onset quality is gradual. The problem occurs intermittently. The problem has been resolved. The pain is located in the periumbilical region. The pain is mild. The quality of the pain is dull. The abdominal pain does not radiate. Associated symptoms include constipation. Pertinent negatives include no diarrhea, dysuria, fever, flatus, headaches, hematochezia or myalgias.  Cough Associated symptoms include postnasal drip. Pertinent negatives include no chest pain, fever, headaches, myalgias, rash, rhinorrhea or sore throat. His past medical history is significant for environmental allergies.  He cannot bring sputum up. Cough is not affecting his sleep.  Asthma: Currently he is on Advair 250-50 mcg twice daily. Negative for wheezing and SOB. He has not used albuterol in several years.  Review of Systems  Constitutional:  Negative for activity change, appetite change and fever.  HENT:  Positive for postnasal drip. Negative for congestion, rhinorrhea and sore throat.   Respiratory:  Positive for cough. Negative for stridor.   Cardiovascular:  Negative for chest pain, palpitations and leg swelling.  Gastrointestinal:  Positive for constipation. Negative for diarrhea, flatus and hematochezia.  Genitourinary:  Negative for decreased urine volume and dysuria.  Musculoskeletal:  Negative for gait  problem and myalgias.  Skin:  Negative for pallor and rash.  Allergic/Immunologic: Positive for environmental allergies.  Neurological:  Negative for syncope, weakness and headaches.  Psychiatric/Behavioral:  Negative for confusion and sleep disturbance.   Rest see pertinent positives and negatives per HPI.  Current Outpatient Medications on File Prior to Visit  Medication Sig Dispense Refill   atorvastatin (LIPITOR) 20 MG tablet TAKE 1 TABLET BY MOUTH EVERY OTHER DAY 90 tablet 3   Calcium Carbonate-Vitamin D (CALCARB 600/D PO) Take by mouth daily.     fluticasone-salmeterol (ADVAIR) 250-50 MCG/ACT AEPB Inhale 1 puff into the lungs twice daily 180 each 2   No current facility-administered medications on file prior to visit.   Past Medical History:  Diagnosis Date   Allergy    Asthma    Chicken pox    Hyperlipidemia    Prostate cancer ([place])    Allergies  Allergen Reactions   Penicillins Other (See Comments)    Dizziness     Social History   Socioeconomic History   Marital status: Married    Spouse name: Not on file   Number of children: Not on file   Years of education: Not on file   Highest education level: Not on file  Occupational History   Occupation: retired  Tobacco Use   Smoking status: Never   Smokeless tobacco: Never  Vaping Use   Vaping Use: Never used  Substance and Sexual Activity   Alcohol use: Not Currently    Alcohol/week: 1.0 standard drink    Types: 1 Standard drinks or equivalent per week    Comment: per week   Drug use: No   Sexual  activity: Not Currently  Other Topics Concern   Not on file  Social History Narrative   Second marriage. Resides with wife in [place]. Retired. Has one grown child. 15 grandchildren to include his step grandchildren.    10-08-18 Unable to ask abuse questions wife with him today.   Social Determinants of Health   Financial Resource Strain: Low Risk    Difficulty of Paying Living Expenses: Not hard at all   Food Insecurity: No Food Insecurity   Worried About Charity fundraiser in the Last Year: Never true   [place] in the Last Year: Never true  Transportation Needs: No Transportation Needs   Lack of Transportation (Medical): No   Lack of Transportation (Non-Medical): No  Physical Activity: Inactive   Days of Exercise per Week: 0 days   Minutes of Exercise per Session: 0 min  Stress: No Stress Concern Present   Feeling of Stress : Not at all  Social Connections: Moderately Isolated   Frequency of Communication with Friends and Family: More than three times a week   Frequency of Social Gatherings with Friends and Family: More than three times a week   Attends Religious Services: Never   Marine scientist or Organizations: Not on file   Attends Archivist Meetings: Never   Marital Status: Married    Vitals:   09/12/21 0757  BP: 124/70  Pulse: 66  Resp: 16  Temp: 97.9 F (36.6 C)  SpO2: 97%   Body mass index is 24.82 kg/m.  Physical Exam Vitals and nursing note reviewed.  Constitutional:      General: He is not in acute distress.    Appearance: He is well-developed and normal weight.  HENT:     Head: Normocephalic and atraumatic.     Mouth/Throat:     Mouth: Mucous membranes are moist.     Dentition: Has dentures.     Comments: Post nasal drainage. Eyes:     Conjunctiva/sclera: Conjunctivae normal.  Cardiovascular:     Rate and Rhythm: Normal rate and regular rhythm.     Heart sounds: No murmur heard. Pulmonary:     Effort: Pulmonary effort is normal. No respiratory distress.     Breath sounds: Normal breath sounds.  Abdominal:     Palpations: Abdomen is soft. There is no hepatomegaly or mass.     Tenderness: There is no abdominal tenderness.  Lymphadenopathy:     Cervical: No cervical adenopathy.  Skin:    General: Skin is warm.     Findings: No erythema or rash.  Neurological:     Mental Status: He is alert and oriented to person,  place, and time.     Gait: Gait normal.  Psychiatric:     Comments: Well groomed, good eye contact.   ASSESSMENT AND PLAN:  [name] was seen today for abdominal pain and cough.  Diagnoses and all orders for this visit:  Constipation, unspecified constipation type This problem could have been a contributing factor for abdominal pain. Recommend adequate fiber and fluid intake. Benefiber is a good option for fiber resource, 1 teaspoon twice daily. MiraLAX at bedtime daily as needed. Instructed about warning signs.  Mild intermittent asthma, uncomplicated Problem has been well controlled. Continue Advair 250-50 mcg twice daily. He does not have albuterol at home, he does not want a prescription at this time.  Cough Problem has improved. We discussed possible etiologies. Lung auscultation negative today. Adequate hydration. OTC plain Mucinex may  help. He agrees with holding on chest x-ray for now. Instructed about warning signs. Benzonatate for symptomatic treatment recommended.  -     benzonatate (TESSALON) 100 MG capsule; Take 1 capsule (100 mg total) by mouth 2 (two) times daily as needed for up to 10 days.  Return if symptoms worsen or fail to improve, for Keep next appt.  Dolan Xia G. [place], MD  [facility]. [place] office.

## 2021-09-20 ENCOUNTER — Other Ambulatory Visit (HOSPITAL_COMMUNITY): Payer: Self-pay

## 2021-10-01 DIAGNOSIS — R509 Fever, unspecified: Secondary | ICD-10-CM | POA: Diagnosis not present

## 2021-11-07 ENCOUNTER — Other Ambulatory Visit (HOSPITAL_COMMUNITY): Payer: Self-pay

## 2021-11-09 ENCOUNTER — Other Ambulatory Visit (HOSPITAL_COMMUNITY): Payer: Self-pay

## 2021-11-09 NOTE — Progress Notes (Signed)
HPI: [name] is a 75 y.o.male here today with his wife for his routine physical examination.  Last CPE: 11/06/20 He lives with his wife.  Regular exercise 3 or more times per week: Not consistently. Following a healthy diet: His wife cooking at home, he has not been consistent.He loves sweets and according to wife,he adda a lot of sugar to his coffee.   Chronic medical problems: Prostate cancer,HLD,asthma,TIA,prediabetes,and HTN  Immunization History  Administered Date(s) Administered   Fluad Quad(high Dose 65+) 09/24/2019, 11/06/2020, 11/12/2021   Influenza, High Dose Seasonal PF 01/30/2018   Influenza,inj,Quad PF,6+ Mos 01/22/2019   PFIZER(Purple Top)SARS-COV-2 Vaccination 03/23/2020, 04/18/2020, 12/30/2020   Health Maintenance  Topic Date Due   COVID-19 Vaccine (4 - Booster for [place] series) 11/25/2021 (Originally 02/24/2021)   Zoster Vaccines- Shingrix (1 of 2) 12/28/2021 (Originally 10/28/1965)   INFLUENZA VACCINE  Completed   Hepatitis C Screening  Completed   HPV VACCINES  Aged Out   Pneumonia Vaccine 48+ Years old  Discontinued   TETANUS/TDAP  Discontinued   Prostate cancer: He follows with urologist He had blood work at his urologist's office today, he is not sure what labs he were ordered.  Colonoscopy 2015-2016 in [place] and it was "fine."  -Negative for high alcohol intake or tobacco use.  -Concerns and/or follow up today:  HTN: He is on  non pharmacologic treatment. Not checking BP at home.  Lab Results  Component Value Date   CREATININE 1.25 01/22/2021   BUN 13 01/22/2021   NA 139 01/22/2021   K 4.6 01/22/2021   CL 102 01/22/2021   CO2 32 01/22/2021   Hyperlipidemia: Currently on Atorvastatin 20 mg every other day. Side effects from medication:none  Lab Results  Component Value Date   CHOL 156 11/06/2020   HDL 69 11/06/2020   LDLCALC 68 11/06/2020   LDLDIRECT 115.0 06/18/2017   TRIG 104 11/06/2020   CHOLHDL 2.3 11/06/2020    Prediabetes:Negative for polydipsia,polyuria, or polyphagia.  Lab Results  Component Value Date   HGBA1C 6.2 (H) 11/06/2020   Review of Systems  Constitutional:  Negative for activity change, appetite change, fatigue and fever.  HENT:  Negative for mouth sores, nosebleeds and sore throat.   Eyes:  Negative for redness and visual disturbance.  Respiratory:  Negative for cough, shortness of breath and wheezing.   Cardiovascular:  Negative for chest pain, palpitations and leg swelling.  Gastrointestinal:  Negative for abdominal pain, blood in stool, nausea and vomiting.  Endocrine: Negative for cold intolerance and heat intolerance.  Genitourinary:  Negative for decreased urine volume, dysuria, genital sores, hematuria and testicular pain.  Musculoskeletal:  Negative for gait problem and myalgias.  Skin:  Negative for color change and rash.  Allergic/Immunologic: Positive for environmental allergies.  Neurological:  Negative for syncope, weakness and headaches.  Psychiatric/Behavioral:  Negative for confusion and sleep disturbance. The patient is not nervous/anxious.   All other systems reviewed and are negative.  Current Outpatient Medications on File Prior to Visit  Medication Sig Dispense Refill   atorvastatin (LIPITOR) 20 MG tablet TAKE 1 TABLET BY MOUTH EVERY OTHER DAY 90 tablet 3   Calcium Carbonate-Vitamin D (CALCARB 600/D PO) Take by mouth daily.     fluticasone-salmeterol (ADVAIR) 250-50 MCG/ACT AEPB Inhale 1 puff into the lungs twice daily 180 each 2   No current facility-administered medications on file prior to visit.     Past Medical History:  Diagnosis Date   Allergy    Asthma  Chicken pox    Hyperlipidemia    Prostate cancer [facility])     Past Surgical History:  Procedure Laterality Date   KNEE ARTHROSCOPY Right    WRIST SURGERY Right     Allergies  Allergen Reactions   Penicillins Other (See Comments)    Dizziness     Family History  Problem  Relation Age of Onset   Cancer Neg Hx    Diabetes Neg Hx    Prostate cancer Neg Hx    Colon cancer Neg Hx    Breast cancer Neg Hx     Social History   Socioeconomic History   Marital status: Married    Spouse name: Not on file   Number of children: Not on file   Years of education: Not on file   Highest education level: Not on file  Occupational History   Occupation: retired  Tobacco Use   Smoking status: Never   Smokeless tobacco: Never  Vaping Use   Vaping Use: Never used  Substance and Sexual Activity   Alcohol use: Not Currently    Alcohol/week: 1.0 standard drink    Types: 1 Standard drinks or equivalent per week    Comment: per week   Drug use: No   Sexual activity: Not Currently  Other Topics Concern   Not on file  Social History Narrative   Second marriage. Resides with wife in [place]. Retired. Has one grown child. 15 grandchildren to include his step grandchildren.    10-08-18 Unable to ask abuse questions wife with him today.   Social Determinants of Health   Financial Resource Strain: Low Risk    Difficulty of Paying Living Expenses: Not hard at all  Food Insecurity: No Food Insecurity   Worried About Charity fundraiser in the Last Year: Never true   [place] in the Last Year: Never true  Transportation Needs: No Transportation Needs   Lack of Transportation (Medical): No   Lack of Transportation (Non-Medical): No  Physical Activity: Inactive   Days of Exercise per Week: 0 days   Minutes of Exercise per Session: 0 min  Stress: No Stress Concern Present   Feeling of Stress : Not at all  Social Connections: Moderately Isolated   Frequency of Communication with Friends and Family: More than three times a week   Frequency of Social Gatherings with Friends and Family: More than three times a week   Attends Religious Services: Never   Marine scientist or Organizations: Not on file   Attends Archivist Meetings: Never   Marital  Status: Married   Vitals:   11/12/21 0748  BP: 126/80  Pulse: 75  SpO2: 99%   Body mass index is 25.14 kg/m.  Wt Readings from Last 3 Encounters:  11/12/21 185 lb 6 oz (84.1 kg)  09/12/21 183 lb (83 kg)  06/18/21 185 lb (83.9 kg)   Physical Exam Vitals and nursing note reviewed.  Constitutional:      General: He is not in acute distress.    Appearance: He is well-developed.  HENT:     Head: Normocephalic and atraumatic.     Right Ear: Tympanic membrane, ear canal and external ear normal.     Left Ear: Tympanic membrane, ear canal and external ear normal.     Mouth/Throat:     Mouth: Mucous membranes are moist.     Pharynx: Oropharynx is clear.  Eyes:     Conjunctiva/sclera: Conjunctivae normal.  Pupils: Pupils are equal, round, and reactive to light.  Cardiovascular:     Rate and Rhythm: Normal rate and regular rhythm.     Pulses:          Dorsalis pedis pulses are 1+ on the right side and 2+ on the left side.     Heart sounds: No murmur heard. Pulmonary:     Effort: Pulmonary effort is normal. No respiratory distress.     Breath sounds: Normal breath sounds.  Abdominal:     Palpations: Abdomen is soft. There is no hepatomegaly or mass.     Tenderness: There is no abdominal tenderness.  Genitourinary:    Comments: Deferred to urologist. Musculoskeletal:        General: No tenderness.     Cervical back: Normal range of motion.     Comments: No major deformities appreciated and no signs of synovitis.  Lymphadenopathy:     Cervical: No cervical adenopathy.  Skin:    General: Skin is warm.     Findings: No erythema.  Neurological:     Mental Status: He is alert and oriented to person, place, and time.     Cranial Nerves: No cranial nerve deficit.     Sensory: No sensory deficit.     Coordination: Coordination normal.     Gait: Gait normal.     Deep Tendon Reflexes:     Reflex Scores:      Bicep reflexes are 2+ on the right side and 2+ on the left side.       Patellar reflexes are 2+ on the right side and 2+ on the left side. Psychiatric:        Mood and Affect: Mood normal.        Behavior: Behavior normal.   ASSESSMENT AND PLAN:  [email or web address] was seen today for annual exam.  Diagnoses and all orders for this visit:  Orders Placed This Encounter  Procedures   Flu Vaccine QUAD High Dose(Fluad)   Comprehensive metabolic panel   Hemoglobin A1c   Lipid panel   VAS [place] ABI WITH/WO TBI   Lab Results  Component Value Date   HGBA1C 6.6 (H) 11/12/2021   Lab Results  Component Value Date   CHOL 171 11/12/2021   HDL 67.40 11/12/2021   LDLCALC 81 11/12/2021   LDLDIRECT 115.0 06/18/2017   TRIG 114.0 11/12/2021   CHOLHDL 3 11/12/2021   Lab Results  Component Value Date   ALT 25 11/12/2021   AST 25 11/12/2021   ALKPHOS 72 11/12/2021   BILITOT 0.7 11/12/2021   Lab Results  Component Value Date   CREATININE 1.29 11/12/2021   BUN 10 11/12/2021   NA 140 11/12/2021   K 4.1 11/12/2021   CL 103 11/12/2021   CO2 30 11/12/2021   Routine general medical examination at a health care facility We discussed the importance of regular physical activity and healthy diet for prevention of chronic illness and/or complications. Preventive guidelines reviewed. Vaccination: Declined pneumonia vaccination. I asked him to sign a release form,so we can obtain copy of colonoscopy. We contacted urologist's office and today he had PSA done. Next CPE in a year.  Decreased pulses in feet Right DP hard to find. ABI will be arranged. Instructed about warning signs.  Need for influenza vaccination -     Flu Vaccine QUAD High Dose(Fluad)  Essential hypertension BP adequately controlled. Continue non pharmacologic treatment.  Hyperlipidemia Continue Atorvastatin 20 mg daily. Low fat diet also recommended. Further recommendations according  to FLP results.  Prediabetes Consistently with a healthy life style recommended for diabetes  prevention. Further recommendations according to HgA1C result.  Return in 1 year (on 11/12/2022) for CPE and f/u.  Willie Brindley G. Martinique, MD  [facility]. [place] office.

## 2021-11-12 ENCOUNTER — Other Ambulatory Visit (HOSPITAL_COMMUNITY): Payer: Self-pay

## 2021-11-12 ENCOUNTER — Encounter: Payer: Self-pay | Admitting: Family Medicine

## 2021-11-12 ENCOUNTER — Ambulatory Visit (INDEPENDENT_AMBULATORY_CARE_PROVIDER_SITE_OTHER): Payer: Medicare HMO | Admitting: Family Medicine

## 2021-11-12 VITALS — BP 126/80 | HR 75 | Ht 72.0 in | Wt 185.4 lb

## 2021-11-12 DIAGNOSIS — I10 Essential (primary) hypertension: Secondary | ICD-10-CM | POA: Diagnosis not present

## 2021-11-12 DIAGNOSIS — Z23 Encounter for immunization: Secondary | ICD-10-CM | POA: Diagnosis not present

## 2021-11-12 DIAGNOSIS — E785 Hyperlipidemia, unspecified: Secondary | ICD-10-CM | POA: Diagnosis not present

## 2021-11-12 DIAGNOSIS — E119 Type 2 diabetes mellitus without complications: Secondary | ICD-10-CM | POA: Insufficient documentation

## 2021-11-12 DIAGNOSIS — R7303 Prediabetes: Secondary | ICD-10-CM | POA: Diagnosis not present

## 2021-11-12 DIAGNOSIS — E1169 Type 2 diabetes mellitus with other specified complication: Secondary | ICD-10-CM | POA: Diagnosis not present

## 2021-11-12 DIAGNOSIS — Z8546 Personal history of malignant neoplasm of prostate: Secondary | ICD-10-CM | POA: Diagnosis not present

## 2021-11-12 DIAGNOSIS — R0989 Other specified symptoms and signs involving the circulatory and respiratory systems: Secondary | ICD-10-CM | POA: Diagnosis not present

## 2021-11-12 DIAGNOSIS — Z0000 Encounter for general adult medical examination without abnormal findings: Secondary | ICD-10-CM | POA: Diagnosis not present

## 2021-11-12 LAB — LIPID PANEL
Cholesterol: 171 mg/dL (ref 0–200)
HDL: 67.4 mg/dL (ref >39.00)
LDL Cholesterol: 81 mg/dL (ref 0–99)
NonHDL: 103.36
Total CHOL/HDL Ratio: 3
Triglycerides: 114 mg/dL (ref 0.0–149.0)
VLDL: 22.8 mg/dL (ref 0.0–40.0)

## 2021-11-12 LAB — COMPREHENSIVE METABOLIC PANEL
ALT: 25 U/L (ref 0–53)
AST: 25 U/L (ref 0–37)
Albumin: 4.9 g/dL (ref 3.5–5.2)
Alkaline Phosphatase: 72 U/L (ref 39–117)
BUN: 10 mg/dL (ref 6–23)
CO2: 30 mEq/L (ref 19–32)
Calcium: 10.1 mg/dL (ref 8.4–10.5)
Chloride: 103 mEq/L (ref 96–112)
Creatinine, Ser: 1.29 mg/dL (ref 0.40–1.50)
GFR: 54.04 mL/min — ABNORMAL LOW (ref >60.00)
Glucose, Bld: 116 mg/dL — ABNORMAL HIGH (ref 70–99)
Potassium: 4.1 mEq/L (ref 3.5–5.1)
Sodium: 140 mEq/L (ref 135–145)
Total Bilirubin: 0.7 mg/dL (ref 0.2–1.2)
Total Protein: 7.7 g/dL (ref 6.0–8.3)

## 2021-11-12 LAB — HEMOGLOBIN A1C: Hgb A1c MFr Bld: 6.6 % — ABNORMAL HIGH (ref 4.6–6.5)

## 2021-11-12 NOTE — Assessment & Plan Note (Signed)
Continue Atorvastatin 20 mg daily. Low fat diet also recommended. Further recommendations according to FLP results.

## 2021-11-12 NOTE — Assessment & Plan Note (Signed)
BP adequately controlled. Continue non pharmacologic treatment. 

## 2021-11-12 NOTE — Patient Instructions (Addendum)
A few things to remember from today's visit:  Routine general medical examination at a health care facility  Essential hypertension - Plan: Comprehensive metabolic panel  Hyperlipidemia, unspecified hyperlipidemia type - Plan: Comprehensive metabolic panel, Lipid panel  Prediabetes - Plan: Hemoglobin A1c  Decreased pulses in feet - Plan: VAS [place] ABI WITH/WO TBI  If you need refills please call your pharmacy. Do not use My Chart to request refills or for acute issues that need immediate attention.   Please be sure medication list is accurate. If a new problem present, please set up appointment sooner than planned today. Sign a release form to obtain copy of your colonoscopy. Let me know if you change your mind about pneumonia vaccine.   Health Maintenance, Male Adopting a healthy lifestyle and getting preventive care are important in promoting health and wellness. Ask your health care provider about: The right schedule for you to have regular tests and exams. Things you can do on your own to prevent diseases and keep yourself healthy. What should I know about diet, weight, and exercise? Eat a healthy diet  Eat a diet that includes plenty of vegetables, fruits, low-fat dairy products, and lean protein. Do not eat a lot of foods that are high in solid fats, added sugars, or sodium. Maintain a healthy weight Body mass index (BMI) is a measurement that can be used to identify possible weight problems. It estimates body fat based on height and weight. Your health care provider can help determine your BMI and help you achieve or maintain a healthy weight. Get regular exercise Get regular exercise. This is one of the most important things you can do for your health. Most adults should: Exercise for at least 150 minutes each week. The exercise should increase your heart rate and make you sweat (moderate-intensity exercise). Do strengthening exercises at least twice a week. This is in  addition to the moderate-intensity exercise. Spend less time sitting. Even light physical activity can be beneficial. Watch cholesterol and blood lipids Have your blood tested for lipids and cholesterol at 76 years of age, then have this test every 5 years. You may need to have your cholesterol levels checked more often if: Your lipid or cholesterol levels are high. You are older than 76 years of age. You are at high risk for heart disease. What should I know about cancer screening? Many types of cancers can be detected early and may often be prevented. Depending on your health history and family history, you may need to have cancer screening at various ages. This may include screening for: Colorectal cancer. Prostate cancer. Skin cancer. Lung cancer. What should I know about heart disease, diabetes, and high blood pressure? Blood pressure and heart disease High blood pressure causes heart disease and increases the risk of stroke. This is more likely to develop in people who have high blood pressure readings or are overweight. Talk with your health care provider about your target blood pressure readings. Have your blood pressure checked: Every 3-5 years if you are 27-17 years of age. Every year if you are 68 years old or older. If you are between the ages of 75 and 51 and are a current or former smoker, ask your health care provider if you should have a one-time screening for abdominal aortic aneurysm (AAA). Diabetes Have regular diabetes screenings. This checks your fasting blood sugar level. Have the screening done: Once every three years after age 66 if you are at a normal weight and have  a low risk for diabetes. More often and at a younger age if you are overweight or have a high risk for diabetes. What should I know about preventing infection? Hepatitis B If you have a higher risk for hepatitis B, you should be screened for this virus. Talk with your health care provider to find out  if you are at risk for hepatitis B infection. Hepatitis C Blood testing is recommended for: Everyone born from [birth year] through 1966. Anyone with known risk factors for hepatitis C. Sexually transmitted infections (STIs) You should be screened each year for STIs, including gonorrhea and chlamydia, if: You are sexually active and are younger than 76 years of age. You are older than 76 years of age and your health care provider tells you that you are at risk for this type of infection. Your sexual activity has changed since you were last screened, and you are at increased risk for chlamydia or gonorrhea. Ask your health care provider if you are at risk. Ask your health care provider about whether you are at high risk for HIV. Your health care provider may recommend a prescription medicine to help prevent HIV infection. If you choose to take medicine to prevent HIV, you should first get tested for HIV. You should then be tested every 3 months for as long as you are taking the medicine. Follow these instructions at home: Alcohol use Do not drink alcohol if your health care provider tells you not to drink. If you drink alcohol: Limit how much you have to 0-2 drinks a day. Know how much alcohol is in your drink. In the [place] one drink equals one 12 oz bottle of beer (355 mL), one 5 oz glass of wine (148 mL), or one 1 oz glass of hard liquor (44 mL). Lifestyle Do not use any products that contain nicotine or tobacco. These products include cigarettes, chewing tobacco, and vaping devices, such as e-cigarettes. If you need help quitting, ask your health care provider. Do not use street drugs. Do not share needles. Ask your health care provider for help if you need support or information about quitting drugs. General instructions Schedule regular health, dental, and eye exams. Stay current with your vaccines. Tell your health care provider if: You often feel depressed. You have ever been abused or do  not feel safe at home. Summary Adopting a healthy lifestyle and getting preventive care are important in promoting health and wellness. Follow your health care provider's instructions about healthy diet, exercising, and getting tested or screened for diseases. Follow your health care provider's instructions on monitoring your cholesterol and blood pressure. This information is not intended to replace advice given to you by your health care provider. Make sure you discuss any questions you have with your health care provider. Document Revised: 04/30/2021 Document Reviewed: 04/30/2021 Elsevier Patient Education  [place].

## 2021-11-12 NOTE — Assessment & Plan Note (Signed)
Consistently with a healthy life style recommended for diabetes prevention. Further recommendations according to HgA1C result.

## 2021-11-19 DIAGNOSIS — N401 Enlarged prostate with lower urinary tract symptoms: Secondary | ICD-10-CM | POA: Diagnosis not present

## 2021-11-19 DIAGNOSIS — C61 Malignant neoplasm of prostate: Secondary | ICD-10-CM | POA: Diagnosis not present

## 2021-11-19 DIAGNOSIS — R3915 Urgency of urination: Secondary | ICD-10-CM | POA: Diagnosis not present

## 2021-11-20 ENCOUNTER — Encounter (HOSPITAL_COMMUNITY): Payer: Medicare HMO

## 2021-11-22 ENCOUNTER — Ambulatory Visit (HOSPITAL_COMMUNITY)
Admission: RE | Admit: 2021-11-22 | Discharge: 2021-11-22 | Disposition: A | Discharge status: Home / Self Care | Payer: Medicare HMO | Source: Ambulatory Visit | Attending: Family Medicine | Admitting: Family Medicine

## 2021-11-22 ENCOUNTER — Other Ambulatory Visit (HOSPITAL_COMMUNITY): Payer: Self-pay

## 2021-11-22 ENCOUNTER — Other Ambulatory Visit: Payer: Self-pay

## 2021-11-22 DIAGNOSIS — R0989 Other specified symptoms and signs involving the circulatory and respiratory systems: Secondary | ICD-10-CM | POA: Insufficient documentation

## 2021-11-30 ENCOUNTER — Other Ambulatory Visit (HOSPITAL_COMMUNITY): Payer: Self-pay

## 2021-12-03 ENCOUNTER — Other Ambulatory Visit (HOSPITAL_COMMUNITY): Payer: Self-pay

## 2021-12-04 ENCOUNTER — Other Ambulatory Visit (HOSPITAL_COMMUNITY): Payer: Self-pay

## 2022-01-02 ENCOUNTER — Other Ambulatory Visit (HOSPITAL_COMMUNITY): Payer: Self-pay

## 2022-01-28 ENCOUNTER — Other Ambulatory Visit (HOSPITAL_COMMUNITY): Payer: Self-pay

## 2022-02-08 ENCOUNTER — Encounter: Payer: Self-pay | Admitting: Family Medicine

## 2022-02-08 ENCOUNTER — Ambulatory Visit (INDEPENDENT_AMBULATORY_CARE_PROVIDER_SITE_OTHER): Payer: Medicare HMO | Admitting: Family Medicine

## 2022-02-08 ENCOUNTER — Ambulatory Visit (INDEPENDENT_AMBULATORY_CARE_PROVIDER_SITE_OTHER): Payer: Medicare HMO

## 2022-02-08 ENCOUNTER — Other Ambulatory Visit (HOSPITAL_COMMUNITY): Payer: Self-pay

## 2022-02-08 ENCOUNTER — Other Ambulatory Visit: Payer: Self-pay

## 2022-02-08 VITALS — BP 130/80 | HR 68 | Resp 16 | Ht 72.0 in | Wt 189.0 lb

## 2022-02-08 DIAGNOSIS — M25512 Pain in left shoulder: Secondary | ICD-10-CM

## 2022-02-08 DIAGNOSIS — N1831 Chronic kidney disease, stage 3a: Secondary | ICD-10-CM | POA: Diagnosis not present

## 2022-02-08 DIAGNOSIS — C61 Malignant neoplasm of prostate: Secondary | ICD-10-CM | POA: Diagnosis not present

## 2022-02-08 MED ORDER — DICLOFENAC SODIUM 1 % EX GEL
2.0000 g | Freq: Four times a day (QID) | CUTANEOUS | 0 refills | Status: DC
  Filled 2022-02-08: qty 100, 12d supply, fill #0

## 2022-02-08 NOTE — Patient Instructions (Signed)
A few things to remember from today's visit:  Acute pain of left shoulder - Plan: diclofenac Sodium (VOLTAREN) 1 % GEL, DG Shoulder Left  If you need refills please call your pharmacy. Do not use My Chart to request refills or for acute issues that need immediate attention.   Topical voltaren and oral tylenol 500 mg 3-4 times per day. PT can be considered, let me know if you decide to do it. Range of motion exercises at home.  Please be sure medication list is accurate. If a new problem present, please set up appointment sooner than planned today.

## 2022-02-08 NOTE — Assessment & Plan Note (Signed)
S/P hormonal therapy and external beam radiation therapy. Following with urologist, last seen 10/2021.

## 2022-02-08 NOTE — Progress Notes (Signed)
ACUTE VISIT Chief Complaint  Patient presents with   Shoulder Pain   HPI: [name] is a 76 y.o. male with history of TIA, prediabetes, asthma, prostate cancer, and hyperlipidemia here today with his wife complaining of a couple weeks of left shoulder pain. Negative for hx of trauma or unusual activity, lifting. Sharp intermittent pain 6-7/10, exacerbated by movement and alleviated by rest.  Shoulder Pain  The pain is present in the left shoulder. This is a new problem. The current episode started 1 to 4 weeks ago. There has been no history of extremity trauma. The problem occurs constantly. The problem has been unchanged. The quality of the pain is described as sharp. The pain is at a severity of 7/10. The pain is moderate. Associated symptoms include a limited range of motion and stiffness. Pertinent negatives include no fever, itching, joint locking, joint swelling, numbness or tingling. The symptoms are aggravated by activity. He has tried nothing for the symptoms. His past medical history is significant for osteoarthritis. There is no history of diabetes, gout or rheumatoid arthritis.   Cr 1.2 and e GFR 54 in 10/2021 and 56 in 12/2020. He has not noted gross hematuria, foam in urine, or decreased urine output. History of hypertension, he is on nonpharmacologic treatment. Prostate cancer follows with urologist.  Lab Results  Component Value Date   CREATININE 1.29 11/12/2021   BUN 10 11/12/2021   NA 140 11/12/2021   K 4.1 11/12/2021   CL 103 11/12/2021   CO2 30 11/12/2021   Review of Systems  Constitutional:  Negative for chills, fatigue and fever.  HENT:  Negative for mouth sores and sore throat.   Respiratory:  Negative for cough, shortness of breath and wheezing.   Cardiovascular:  Negative for chest pain, palpitations and leg swelling.  Gastrointestinal:  Negative for abdominal pain, nausea and vomiting.  Musculoskeletal:  Positive for stiffness. Negative for gout.   Skin:  Negative for itching, pallor and rash.  Neurological:  Negative for tingling, syncope, weakness and numbness.  Rest see pertinent positives and negatives per HPI.  Current Outpatient Medications on File Prior to Visit  Medication Sig Dispense Refill   atorvastatin (LIPITOR) 20 MG tablet TAKE 1 TABLET BY MOUTH EVERY OTHER DAY 90 tablet 3   Calcium Carbonate-Vitamin D (CALCARB 600/D PO) Take by mouth daily.     fluticasone-salmeterol (ADVAIR) 250-50 MCG/ACT AEPB Inhale 1 puff into the lungs twice daily 180 each 2   No current facility-administered medications on file prior to visit.   Past Medical History:  Diagnosis Date   Allergy    Asthma    Chicken pox    Hyperlipidemia    Prostate cancer ([place])    Allergies  Allergen Reactions   Penicillins Other (See Comments)    Dizziness    Social History   Socioeconomic History   Marital status: Married    Spouse name: Not on file   Number of children: Not on file   Years of education: Not on file   Highest education level: Not on file  Occupational History   Occupation: retired  Tobacco Use   Smoking status: Never   Smokeless tobacco: Never  Vaping Use   Vaping Use: Never used  Substance and Sexual Activity   Alcohol use: Not Currently    Alcohol/week: 1.0 standard drink    Types: 1 Standard drinks or equivalent per week    Comment: per week   Drug use: No   Sexual activity: Not Currently  Other Topics Concern   Not on file  Social History Narrative   Second marriage. Resides with wife in [place]. Retired. Has one grown child. 15 grandchildren to include his step grandchildren.    10-08-18 Unable to ask abuse questions wife with him today.   Social Determinants of Health   Financial Resource Strain: Low Risk    Difficulty of Paying Living Expenses: Not hard at all  Food Insecurity: No Food Insecurity   Worried About Charity fundraiser in the Last Year: Never true   [place] in the Last Year: Never  true  Transportation Needs: No Transportation Needs   Lack of Transportation (Medical): No   Lack of Transportation (Non-Medical): No  Physical Activity: Inactive   Days of Exercise per Week: 0 days   Minutes of Exercise per Session: 0 min  Stress: No Stress Concern Present   Feeling of Stress : Not at all  Social Connections: Moderately Isolated   Frequency of Communication with Friends and Family: More than three times a week   Frequency of Social Gatherings with Friends and Family: More than three times a week   Attends Religious Services: Never   Marine scientist or Organizations: Not on file   Attends Archivist Meetings: Never   Marital Status: Married   Vitals:   02/08/22 0942  BP: 130/80  Pulse: 68  Resp: 16  SpO2: 99%   Body mass index is 25.63 kg/m.  Physical Exam Vitals and nursing note reviewed.  Constitutional:      General: He is not in acute distress.    Appearance: He is well-developed. He is not ill-appearing.  HENT:     Head: Normocephalic and atraumatic.  Eyes:     Conjunctiva/sclera: Conjunctivae normal.  Cardiovascular:     Rate and Rhythm: Normal rate and regular rhythm.     Heart sounds: No murmur heard. Pulmonary:     Effort: Pulmonary effort is normal. No respiratory distress.  Musculoskeletal:     Left shoulder: Tenderness (With ROM and palpation of posterior aspect.) present. No bony tenderness or crepitus. Decreased range of motion (about 120 degree abduction.). Normal pulse.     Comments: Shoulder: No deformity, edema, or erythema appreciated.No muscle atrophy. Luan Pulling' test neg, empty can supraspinatus test negative, cross body adduction test neg, lift-Off Subscapularis test elicits mild pain.  Lymphadenopathy:     Cervical: No cervical adenopathy.  Skin:    General: Skin is warm.     Findings: No erythema or rash.  Neurological:     General: No focal deficit present.     Mental Status: He is alert and oriented to  person, place, and time.     Gait: Gait normal.  Psychiatric:        Mood and Affect: Mood and affect normal.   ASSESSMENT AND PLAN:  [email or web address] was seen today for shoulder pain.  Diagnoses and all orders for this visit:  Acute pain of left shoulder We discussed possible etiologies. ?  Rotator cuff tendinitis versus OA. He is not interested in PT. Given his history of prostate cancer, left shoulder x-ray was ordered. ROM exercises recommended. Topical Voltaren and OTC IcyHot. Tylenol 500 mg 3-4 times per day as needed.  -     diclofenac Sodium (VOLTAREN) 1 % GEL; Apply 2 g topically 4 (four) times daily. -     DG Shoulder Left; Future  Stage 3a chronic kidney disease ([place]) We discussed lab results. Low salt  diet, adequate hydration, and avoidance of oral NSAIDs recommended.  Return if symptoms worsen or fail to improve.  Tiger Spieker G. [place], MD  [facility] [place] office.

## 2022-03-16 ENCOUNTER — Emergency Department (HOSPITAL_COMMUNITY): Payer: Medicare HMO

## 2022-03-16 ENCOUNTER — Other Ambulatory Visit: Payer: Self-pay

## 2022-03-16 ENCOUNTER — Emergency Department (HOSPITAL_COMMUNITY)
Admission: EM | Admit: 2022-03-16 | Discharge: 2022-03-16 | Disposition: A | Discharge status: Home / Self Care | Payer: Medicare HMO | Attending: Emergency Medicine | Admitting: Emergency Medicine

## 2022-03-16 ENCOUNTER — Encounter (HOSPITAL_COMMUNITY): Payer: Self-pay | Admitting: Emergency Medicine

## 2022-03-16 DIAGNOSIS — Z8546 Personal history of malignant neoplasm of prostate: Secondary | ICD-10-CM | POA: Diagnosis not present

## 2022-03-16 DIAGNOSIS — R109 Unspecified abdominal pain: Secondary | ICD-10-CM | POA: Diagnosis not present

## 2022-03-16 DIAGNOSIS — J45909 Unspecified asthma, uncomplicated: Secondary | ICD-10-CM | POA: Diagnosis not present

## 2022-03-16 DIAGNOSIS — R1013 Epigastric pain: Secondary | ICD-10-CM | POA: Diagnosis present

## 2022-03-16 DIAGNOSIS — Z7951 Long term (current) use of inhaled steroids: Secondary | ICD-10-CM | POA: Insufficient documentation

## 2022-03-16 DIAGNOSIS — R0789 Other chest pain: Secondary | ICD-10-CM | POA: Diagnosis not present

## 2022-03-16 DIAGNOSIS — K5900 Constipation, unspecified: Secondary | ICD-10-CM | POA: Diagnosis not present

## 2022-03-16 DIAGNOSIS — R1084 Generalized abdominal pain: Secondary | ICD-10-CM | POA: Diagnosis not present

## 2022-03-16 LAB — COMPREHENSIVE METABOLIC PANEL
ALT: 20 U/L (ref 0–44)
AST: 26 U/L (ref 15–41)
Albumin: 4.6 g/dL (ref 3.5–5.0)
Alkaline Phosphatase: 68 U/L (ref 38–126)
Anion gap: 7 (ref 5–15)
BUN: 13 mg/dL (ref 8–23)
CO2: 25 mmol/L (ref 22–32)
Calcium: 9.6 mg/dL (ref 8.9–10.3)
Chloride: 103 mmol/L (ref 98–111)
Creatinine, Ser: 1.33 mg/dL — ABNORMAL HIGH (ref 0.61–1.24)
GFR, Estimated: 55 mL/min — ABNORMAL LOW (ref >60)
Glucose, Bld: 120 mg/dL — ABNORMAL HIGH (ref 70–99)
Potassium: 4.4 mmol/L (ref 3.5–5.1)
Sodium: 135 mmol/L (ref 135–145)
Total Bilirubin: 1.1 mg/dL (ref 0.3–1.2)
Total Protein: 7.7 g/dL (ref 6.5–8.1)

## 2022-03-16 LAB — CBC WITH DIFFERENTIAL/PLATELET
Abs Immature Granulocytes: 0.01 10*3/uL (ref 0.00–0.07)
Basophils Absolute: 0 10*3/uL (ref 0.0–0.1)
Basophils Relative: 0 %
Eosinophils Absolute: 0 10*3/uL (ref 0.0–0.5)
Eosinophils Relative: 1 %
HCT: 47.3 % (ref 39.0–52.0)
Hemoglobin: 15.4 g/dL (ref 13.0–17.0)
Immature Granulocytes: 0 %
Lymphocytes Relative: 10 %
Lymphs Abs: 0.8 10*3/uL (ref 0.7–4.0)
MCH: 29.4 pg (ref 26.0–34.0)
MCHC: 32.6 g/dL (ref 30.0–36.0)
MCV: 90.4 fL (ref 80.0–100.0)
Monocytes Absolute: 0.7 10*3/uL (ref 0.1–1.0)
Monocytes Relative: 9 %
Neutro Abs: 6 10*3/uL (ref 1.7–7.7)
Neutrophils Relative %: 80 %
Platelets: 227 10*3/uL (ref 150–400)
RBC: 5.23 MIL/uL (ref 4.22–5.81)
RDW: 13.2 % (ref 11.5–15.5)
WBC: 7.5 10*3/uL (ref 4.0–10.5)
nRBC: 0 % (ref 0.0–0.2)

## 2022-03-16 LAB — TROPONIN I (HIGH SENSITIVITY): Troponin I (High Sensitivity): 6 ng/L (ref <18)

## 2022-03-16 MED ORDER — IOHEXOL 300 MG/ML  SOLN
100.0000 mL | Freq: Once | INTRAMUSCULAR | Status: AC | PRN
  Administered 2022-03-16: 100 mL via INTRAVENOUS

## 2022-03-16 MED ORDER — SENNOSIDES-DOCUSATE SODIUM 8.6-50 MG PO TABS: 1.0000 | ORAL_TABLET | Freq: Two times a day (BID) | ORAL | 0 refills | Status: AC

## 2022-03-16 MED ORDER — LIDOCAINE HCL URETHRAL/MUCOSAL 2 % EX GEL
1.0000 "application " | Freq: Once | CUTANEOUS | Status: DC | PRN
  Filled 2022-03-16: qty 30

## 2022-03-16 MED ORDER — PANTOPRAZOLE SODIUM 40 MG PO TBEC: 40.0000 mg | DELAYED_RELEASE_TABLET | Freq: Every day | ORAL | 0 refills | Status: DC

## 2022-03-16 NOTE — Discharge Instructions (Signed)
Recommend hydration, diet with fiber, senokot twice a day, (or senna and docusate),  miralax--may do up to 4 caps in 1 32oz bottle.  ?

## 2022-03-16 NOTE — ED Provider Notes (Signed)
?[facility] DEPT ?Provider Note ? ? ?CSN: [id number] ?Arrival date & time: 03/16/22  5462 ? ?  ? ?History ? ?Chief Complaint  ?Patient presents with  ? Constipation  ? ? ?Willie Parks is a 76 y.o. male. ? ?HPI ? ?  ? ?76yo male with history of hyperlipidemia, prostate cancer, presents from urgent care with concern for constipation. ? ?Has not had a BM since Wednesday, wife gave fleet enema at 6AM, did not have bowel movement following this, just small amount of watery brown stool.  Reports feeling like he has a hard ball that won't pass.  Denies any significant abdominal pain, nausea, vomiting. No urinary symptoms, no fevers.  Describes some epigastric discomfort, a pressure like sensation.  He feels like his stomach is uncomfortable, a lot of pressure. Feels like his abdomen is hard and swollen.  He was sent by urgent care due to concern for SBO.   ? ?Past Medical History:  ?Diagnosis Date  ? Allergy   ? Asthma   ? Chicken pox   ? Hyperlipidemia   ? Prostate cancer [facility])   ?  ? ?Home Medications ?Prior to Admission medications   ?Medication Sig Start Date End Date Taking? Authorizing Provider  ?pantoprazole (PROTONIX) 40 MG tablet Take 1 tablet (40 mg total) by mouth daily for 14 days. 03/16/22 03/30/22 Yes Gareth Morgan, MD  ?senna-docusate (SENOKOT-S) 8.6-50 MG tablet Take 1 tablet by mouth 2 (two) times daily for 7 days. 03/16/22 03/23/22 Yes Gareth Morgan, MD  ?atorvastatin (LIPITOR) 20 MG tablet TAKE 1 TABLET BY MOUTH EVERY OTHER DAY 08/15/21 08/15/22  Martinique, Betty G, MD  ?Calcium Carbonate-Vitamin D (CALCARB 600/D PO) Take by mouth daily.    [provider]  ?diclofenac Sodium (VOLTAREN) 1 % GEL Apply 2 g topically 4 (four) times daily. 02/08/22   Martinique, Betty G, MD  ?fluticasone-salmeterol (ADVAIR) 250-50 MCG/ACT AEPB Inhale 1 puff into the lungs twice daily 06/18/21   [place], Betty G, MD  ?   ? ?Allergies    ?Penicillins   ? ?Review of Systems   ?Review of Systems ?See  above ?Physical Exam ?Updated Vital Signs ?BP 139/80   Pulse 70   Temp 97.6 ?F (36.4 ?C) (Oral)   Resp 19   Ht 6' (1.829 m)   Wt 86 kg   SpO2 98%   BMI 25.71 kg/m?  ?Physical Exam ?Vitals and nursing note reviewed.  ?Constitutional:   ?   General: He is not in acute distress. ?   Appearance: He is well-developed. He is not diaphoretic.  ?HENT:  ?   Head: Normocephalic and atraumatic.  ?Eyes:  ?   Conjunctiva/sclera: Conjunctivae normal.  ?Cardiovascular:  ?   Rate and Rhythm: Normal rate and regular rhythm.  ?   Heart sounds: Normal heart sounds. No murmur heard. ?  No friction rub. No gallop.  ?Pulmonary:  ?   Effort: Pulmonary effort is normal. No respiratory distress.  ?   Breath sounds: Normal breath sounds. No wheezing or rales.  ?Abdominal:  ?   General: There is no distension.  ?   Palpations: Abdomen is soft.  ?   Tenderness: There is no abdominal tenderness. There is no guarding.  ?Musculoskeletal:  ?   Cervical back: Normal range of motion.  ?Skin: ?   General: Skin is warm and dry.  ?Neurological:  ?   Mental Status: He is alert and oriented to person, place, and time.  ? ? ?ED Results /  Procedures / Treatments   ?Labs ?(all labs ordered are listed, but only abnormal results are displayed) ?Labs Reviewed  ?COMPREHENSIVE METABOLIC PANEL - Abnormal; Notable for the following components:  ?    Result Value  ? Glucose, Bld 120 (*)   ? Creatinine, Ser 1.33 (*)   ? GFR, Estimated 55 (*)   ? All other components within normal limits  ?CBC WITH DIFFERENTIAL/PLATELET  ?TROPONIN I (HIGH SENSITIVITY)  ?TROPONIN I (HIGH SENSITIVITY)  ? ? ?EKG ?EKG Interpretation ? ?Date/Time:  Saturday March 16 2022 10:57:48 EDT ?Ventricular Rate:  72 ?PR Interval:  236 ?QRS Duration: 97 ?QT Interval:  380 ?QTC Calculation: 416 ?R Axis:   65 ?Text Interpretation: Sinus rhythm Prolonged PR interval Nonspecific T abnormalities, lateral leads No previous ECGs available Confirmed by Gareth Morgan [phone]) on 03/16/2022 1:02:11  PM ? ?Radiology ?CT ABDOMEN PELVIS W CONTRAST ? ?Result Date: 03/16/2022 ?CLINICAL DATA:  Abdominal pain, acute (Ped 0-17y) EXAM: CT ABDOMEN AND PELVIS WITH CONTRAST TECHNIQUE: Multidetector CT imaging of the abdomen and pelvis was performed using the standard protocol following bolus administration of intravenous contrast. RADIATION DOSE REDUCTION: This exam was performed according to the departmental dose-optimization program which includes automated exposure control, adjustment of the mA and/or kV according to patient size and/or use of iterative reconstruction technique. CONTRAST:  143m OMNIPAQUE IOHEXOL 300 MG/ML  SOLN COMPARISON:  CT abdomen pelvis 09/07/2020 FINDINGS: Lower chest: Bibasilar scarring/linear atelectasis. Hepatobiliary: No focal liver abnormality is seen. The gallbladder is unremarkable. Pancreas: Unremarkable. No pancreatic ductal dilatation or surrounding inflammatory changes. Spleen: Normal in size without focal abnormality. Adrenals/Urinary Tract: Adrenal glands are unremarkable. No hydronephrosis or nephrolithiasis. There is moderate distention of the bladder with irregularity at the bladder outlet likely due to enlarged prostate. Stomach/Bowel: Small hiatal hernia. The stomach is otherwise unremarkable. No evidence of bowel obstruction.The appendix is normal. There is a significant rectosigmoid stool burden with mild wall thickening of the rectum. Vascular/Lymphatic: Aortoiliac atherosclerotic calcifications. No AAA. No lymphadenopathy. Reproductive: Enlarged prostate gland protruding into the bladder. Metallic prostate markers are present. Other: No abdominal wall hernia or abnormality. No abdominopelvic ascites. Musculoskeletal: No acute osseous abnormality. No suspicious lytic or blastic lesions. Multilevel degenerative changes of the spine. IMPRESSION: Significant rectosigmoid stool burden with mild rectal wall thickening. No evidence of bowel obstruction. Moderate distention of  bladder with irregularity at the bladder outlet likely related to an enlarged protruding prostate gland. No other acute findings.  The appendix is normal. Electronically Signed   By: JMaurine SimmeringM.D.   On: 03/16/2022 12:30   ? ?Procedures ?Procedures  ? ? ?Medications Ordered in ED ?Medications  ?iohexol (OMNIPAQUE) 300 MG/ML solution 100 mL (100 mLs Intravenous Contrast Given 03/16/22 1201)  ? ? ?ED Course/ Medical Decision Making/ A&P ?  ?                        ?Medical Decision Making ?Amount and/or Complexity of Data Reviewed ?Labs: ordered. ?Radiology: ordered. ? ?Risk ?OTC drugs. ?Prescription drug management. ? ? ? ?89yom[place] with history of hyperlipidemia, prostate cancer, presents from urgent care with concern for constipation. ? ?Attempted rectal disimpaction given history however no stool in rectum.   ? ?DDx includes colonic obstruction, SBO, constipation, ACS (given hx of epigastric discomfort which he reported radiating to chest at urgent care). ? ?Labs were completed and evaluated by me and show no leukocytosis, no anemia, no transaminitis.  EKG evaluated by me without acute abnormalities. Troponin  negative, doubt ACS. ? ?CT completed shows significant rectosigmoid stool burden with mild rectal wall thickening, no evidence of obstruction, moderate distention of bladder with irregularity likely related to enlarged prostate.   ? ?Able to urinate normally, no urinary symptoms, doubt UTI.  Recommend urology follow up given prostatic enlargement, unclear if change from prior.   ? ?Recommend aggressive bowel regimen for constipation. Discussed reasons to return in detail.   ? ? ? ? ? ? ? ?Final Clinical Impression(s) / ED Diagnoses ?Final diagnoses:  ?Constipation, unspecified constipation type  ? ? ?Rx / DC Orders ?ED Discharge Orders   ? ?      Ordered  ?  senna-docusate (SENOKOT-S) 8.6-50 MG tablet  2 times daily       ? 03/16/22 1338  ?  pantoprazole (PROTONIX) 40 MG tablet  Daily       ? 03/16/22 1338   ? ?  ?  ? ?  ? ? ?  ?Gareth Morgan, MD ?03/17/22 2138 ? ?

## 2022-03-16 NOTE — ED Triage Notes (Signed)
Sent over from UC to rule out a bowel obstruction. Last BM Wednesday, wife gave fleet enema around 6am this morning, only watery brown stool, patient states he feels like he has a hard ball. Denies N/V.  ?

## 2022-03-18 ENCOUNTER — Telehealth: Payer: Self-pay | Admitting: Family Medicine

## 2022-03-18 DIAGNOSIS — C61 Malignant neoplasm of prostate: Secondary | ICD-10-CM

## 2022-03-18 NOTE — Telephone Encounter (Signed)
Referral can be placed as requested. ?Thanks, ?BJ ?

## 2022-03-18 NOTE — Telephone Encounter (Signed)
Okay for referral?

## 2022-03-18 NOTE — Telephone Encounter (Signed)
Pt wife  is calling and [facility] is not in network . Pt has [employer] and pt will be seeing dr Norma Fredrickson at [facility] address [address] [place]. Pt dx swollen prostate and pt had a history of PSA cancer. Pt appt is 04-04-22 ?

## 2022-03-19 ENCOUNTER — Other Ambulatory Visit (HOSPITAL_COMMUNITY): Payer: Self-pay

## 2022-03-21 ENCOUNTER — Other Ambulatory Visit (HOSPITAL_COMMUNITY): Payer: Self-pay

## 2022-03-25 ENCOUNTER — Other Ambulatory Visit (HOSPITAL_COMMUNITY): Payer: Self-pay

## 2022-04-04 ENCOUNTER — Other Ambulatory Visit (HOSPITAL_COMMUNITY): Payer: Self-pay

## 2022-04-04 DIAGNOSIS — N138 Other obstructive and reflux uropathy: Secondary | ICD-10-CM | POA: Diagnosis not present

## 2022-04-04 DIAGNOSIS — Z8546 Personal history of malignant neoplasm of prostate: Secondary | ICD-10-CM | POA: Diagnosis not present

## 2022-04-04 DIAGNOSIS — N401 Enlarged prostate with lower urinary tract symptoms: Secondary | ICD-10-CM | POA: Diagnosis not present

## 2022-04-04 DIAGNOSIS — R3915 Urgency of urination: Secondary | ICD-10-CM | POA: Diagnosis not present

## 2022-04-04 MED ORDER — TAMSULOSIN HCL 0.4 MG PO CAPS
0.4000 mg | ORAL_CAPSULE | Freq: Every day | ORAL | 6 refills | Status: DC
  Filled 2022-04-04: qty 30, 30d supply, fill #0
  Filled 2022-06-18: qty 30, 30d supply, fill #1

## 2022-05-02 DIAGNOSIS — C61 Malignant neoplasm of prostate: Secondary | ICD-10-CM | POA: Diagnosis not present

## 2022-05-02 DIAGNOSIS — Z8546 Personal history of malignant neoplasm of prostate: Secondary | ICD-10-CM | POA: Diagnosis not present

## 2022-05-02 DIAGNOSIS — R590 Localized enlarged lymph nodes: Secondary | ICD-10-CM | POA: Diagnosis not present

## 2022-05-02 DIAGNOSIS — N400 Enlarged prostate without lower urinary tract symptoms: Secondary | ICD-10-CM | POA: Diagnosis not present

## 2022-05-02 DIAGNOSIS — N3289 Other specified disorders of bladder: Secondary | ICD-10-CM | POA: Diagnosis not present

## 2022-06-01 ENCOUNTER — Other Ambulatory Visit (HOSPITAL_COMMUNITY): Payer: Self-pay

## 2022-06-18 ENCOUNTER — Other Ambulatory Visit (HOSPITAL_COMMUNITY): Payer: Self-pay

## 2022-06-26 ENCOUNTER — Other Ambulatory Visit: Payer: Self-pay | Admitting: Family Medicine

## 2022-06-26 ENCOUNTER — Other Ambulatory Visit (HOSPITAL_COMMUNITY): Payer: Self-pay

## 2022-06-26 MED ORDER — FLUTICASONE-SALMETEROL 250-50 MCG/ACT IN AEPB
INHALATION_SPRAY | RESPIRATORY_TRACT | 2 refills | Status: DC
  Filled 2022-06-26: qty 60, 30d supply, fill #0
  Filled 2022-09-30 – 2022-10-28 (×3): qty 60, 30d supply, fill #1
  Filled 2022-12-30: qty 60, 30d supply, fill #2
  Filled 2023-04-11: qty 60, 30d supply, fill #3
  Filled 2023-06-13: qty 60, 30d supply, fill #4

## 2022-06-29 ENCOUNTER — Other Ambulatory Visit (HOSPITAL_COMMUNITY): Payer: Self-pay

## 2022-08-01 DIAGNOSIS — C61 Malignant neoplasm of prostate: Secondary | ICD-10-CM | POA: Diagnosis not present

## 2022-08-01 DIAGNOSIS — N320 Bladder-neck obstruction: Secondary | ICD-10-CM | POA: Diagnosis not present

## 2022-08-06 ENCOUNTER — Other Ambulatory Visit (HOSPITAL_COMMUNITY): Payer: Self-pay

## 2022-08-06 ENCOUNTER — Encounter: Payer: Self-pay | Admitting: Family Medicine

## 2022-08-06 ENCOUNTER — Ambulatory Visit (INDEPENDENT_AMBULATORY_CARE_PROVIDER_SITE_OTHER): Payer: Medicare HMO | Admitting: Family Medicine

## 2022-08-06 VITALS — BP 136/78 | HR 62 | Temp 98.0°F | Wt 183.4 lb

## 2022-08-06 DIAGNOSIS — J40 Bronchitis, not specified as acute or chronic: Secondary | ICD-10-CM | POA: Diagnosis not present

## 2022-08-06 MED ORDER — AZITHROMYCIN 250 MG PO TABS
ORAL_TABLET | ORAL | 0 refills | Status: DC
  Filled 2022-08-06: qty 6, 5d supply, fill #0

## 2022-08-06 NOTE — Progress Notes (Signed)
   Subjective:    Patient ID: Willie Parks, male    DOB: June 27, 1946, 76 y.o.   MRN: [id number]  HPI Here for one week of chest congestion and coughing up yellow sputum. No chest  pain or wheezing or SOB. No fever. His BP at home this morning was 397 systolic and they were concerned.    Review of Systems  Constitutional: Negative.   HENT: Negative.    Eyes: Negative.   Respiratory:  Positive for cough. Negative for shortness of breath and wheezing.   Cardiovascular: Negative.        Objective:   Physical Exam Constitutional:      Appearance: Normal appearance. He is not ill-appearing.  HENT:     Right Ear: Tympanic membrane, ear canal and external ear normal.     Left Ear: Tympanic membrane, ear canal and external ear normal.     Nose: Nose normal.     Mouth/Throat:     Pharynx: Oropharynx is clear.  Eyes:     Conjunctiva/sclera: Conjunctivae normal.  Cardiovascular:     Rate and Rhythm: Normal rate and regular rhythm.     Pulses: Normal pulses.     Heart sounds: Normal heart sounds.  Pulmonary:     Effort: Pulmonary effort is normal.     Breath sounds: Normal breath sounds.  Lymphadenopathy:     Cervical: No cervical adenopathy.  Neurological:     Mental Status: He is alert.           Assessment & Plan:  Bronchitis, treat with a Zpack. He canuse Delsym for the cough. His BP is fine now. I asked them to watch this at home and to return if it goes high again. Alysia Penna, MD

## 2022-08-07 ENCOUNTER — Other Ambulatory Visit (HOSPITAL_COMMUNITY): Payer: Self-pay

## 2022-08-17 ENCOUNTER — Other Ambulatory Visit: Payer: Self-pay | Admitting: Family Medicine

## 2022-08-17 DIAGNOSIS — G459 Transient cerebral ischemic attack, unspecified: Secondary | ICD-10-CM

## 2022-08-17 DIAGNOSIS — E785 Hyperlipidemia, unspecified: Secondary | ICD-10-CM

## 2022-08-19 ENCOUNTER — Other Ambulatory Visit (HOSPITAL_COMMUNITY): Payer: Self-pay

## 2022-08-19 MED ORDER — ATORVASTATIN CALCIUM 20 MG PO TABS
ORAL_TABLET | ORAL | 3 refills | Status: DC
  Filled 2022-08-19: qty 90, fill #0
  Filled 2022-08-20: qty 45, 90d supply, fill #0
  Filled 2022-10-28: qty 45, 90d supply, fill #1

## 2022-08-20 ENCOUNTER — Other Ambulatory Visit (HOSPITAL_COMMUNITY): Payer: Self-pay

## 2022-08-21 ENCOUNTER — Ambulatory Visit: Payer: Medicare HMO | Admitting: Family Medicine

## 2022-08-26 DIAGNOSIS — R030 Elevated blood-pressure reading, without diagnosis of hypertension: Secondary | ICD-10-CM | POA: Diagnosis not present

## 2022-09-02 NOTE — Progress Notes (Unsigned)
HPI: [name] is a 76 y.o. male with history of hypertension, vitamin D deficiency, TIA, DM 2, asthma, and prostate cancer here today to follow on recent urgent care visit.  Evaluated in the ED because elevated BP at 189/100.  His wife is very upset because according to her, he has never had HTN and never prescribed medication. Note from 05/08/2020 in regard to HTN: Currently he is on nonpharmacologic treatment. During urology visits his BP has been elevated. 03/13/2020 BP was 160/92. 12/15/2019 BP was 180/82. 08/2019 BP was 151/73.  07/05/2020 follow up note: He was last seen on 05/08/20, when it was a concerned about elevated BP a few times at his urologist's office. Amlodipine 2.5 mg was recommended,he did not start medication.   He has not started new medications or OTC supplements. Recently he had cough for a few days, it has resolved. He has decreased salt intake. Home BP's 120-150's/60-70's, one BP 103/69. He denies for unusual or severe headache, visual changes, exertional chest pain, dyspnea,  focal weakness, or edema.  CKD III: He has not noted gross hematuria, foam in urine,or decreased urine outout. Cr 1.2-1.3 and e GFR mid 50's, last one on 04/04/2022 was 59. Mild hyperCa++.   Contains abnormal data Basic Metabolic Panel Order: [id number]  Ref Range & Units 5 mo ago  Sodium 135 - 146 MMOL/L 138   Potassium 3.5 - 5.3 MMOL/L 4.7   Chloride 98 - 110 MMOL/L 103   CO2 23 - 30 MMOL/L 29   BUN 8 - 24 MG/DL 12   Glucose 70 - 99 MG/DL 101 High    Comment: Patients taking eltrombopag at doses >/= 100 mg daily may show falsely elevated values of 10% or greater.  Creatinine 0.50 - 1.50 MG/DL 1.26   Calcium 8.5 - 10.5 MG/DL 10.6 High    Anion Gap 4 - 14 MMOL/L 6   Est. GFR >=60 ML/MIN/1.73 M*2 59 Low      DM II: Dx'ed in 10/2021. He is not checking BS's. Negative for polydipsia,polyuria,polyphagia, foot numbness/trauma. He is on non pharmacologic treatment.  Lab  Results  Component Value Date   HGBA1C 6.6 (H) 11/12/2021   HLD on Atorvastatin 20 mg daily.  Review of Systems  Constitutional:  Negative for activity change, appetite change and fever.  HENT:  Negative for nosebleeds and sore throat.   Respiratory:  Negative for chest tightness and wheezing.   Gastrointestinal:  Negative for abdominal pain, nausea and vomiting.  Neurological:  Negative for syncope and facial asymmetry.  Rest see pertinent positives and negatives per HPI.  Current Outpatient Medications on File Prior to Visit  Medication Sig Dispense Refill   atorvastatin (LIPITOR) 20 MG tablet TAKE 1 TABLET BY MOUTH EVERY OTHER DAY 90 tablet 3   Calcium Carbonate-Vitamin D (CALCARB 600/D PO) Take by mouth daily.     fluticasone-salmeterol (ADVAIR DISKUS) 250-50 MCG/ACT AEPB Inhale 1 puff into the lungs twice daily 180 each 2   No current facility-administered medications on file prior to visit.   Past Medical History:  Diagnosis Date   Allergy    Asthma    Chicken pox    Hyperlipidemia    Prostate cancer ([place])    Allergies  Allergen Reactions   Penicillins Other (See Comments)    Dizziness    Social History   Socioeconomic History   Marital status: Married    Spouse name: Not on file   Number of children: Not on file   Years  of education: Not on file   Highest education level: Not on file  Occupational History   Occupation: retired  Tobacco Use   Smoking status: Never   Smokeless tobacco: Never  Vaping Use   Vaping Use: Never used  Substance and Sexual Activity   Alcohol use: Not Currently    Alcohol/week: 1.0 standard drink of alcohol    Types: 1 Standard drinks or equivalent per week    Comment: per week   Drug use: No   Sexual activity: Not Currently  Other Topics Concern   Not on file  Social History Narrative   Second marriage. Resides with wife in [place]. Retired. Has one grown child. 15 grandchildren to include his step grandchildren.     10-08-18 Unable to ask abuse questions wife with him today.   Social Determinants of Health   Financial Resource Strain: Low Risk  (08/28/2021)   Overall Financial Resource Strain (CARDIA)    Difficulty of Paying Living Expenses: Not hard at all  Food Insecurity: No Food Insecurity (08/28/2021)   Hunger Vital Sign    Worried About Running Out of Food in the Last Year: Never true    Ran Out of Food in the Last Year: Never true  Transportation Needs: No Transportation Needs (08/28/2021)   PRAPARE - Hydrologist (Medical): No    Lack of Transportation (Non-Medical): No  Physical Activity: Inactive (08/28/2021)   Exercise Vital Sign    Days of Exercise per Week: 0 days    Minutes of Exercise per Session: 0 min  Stress: No Stress Concern Present (08/28/2021)   [place]    Feeling of Stress : Not at all  Social Connections: Moderately Isolated (08/28/2021)   Social Connection and Isolation Panel [NHANES]    Frequency of Communication with Friends and Family: More than three times a week    Frequency of Social Gatherings with Friends and Family: More than three times a week    Attends Religious Services: Never    Marine scientist or Organizations: Not on file    Attends Archivist Meetings: Never    Marital Status: Married   Vitals:   09/03/22 1049  BP: 128/70  Pulse: 67  Resp: 12  SpO2: 97%   Body mass index is 24.89 kg/m.  Physical Exam Vitals and nursing note reviewed.  Constitutional:      General: He is not in acute distress.    Appearance: He is well-developed.  HENT:     Head: Normocephalic and atraumatic.     Mouth/Throat:     Mouth: Mucous membranes are moist.     Pharynx: Oropharynx is clear.  Eyes:     Conjunctiva/sclera: Conjunctivae normal.  Cardiovascular:     Rate and Rhythm: Normal rate and regular rhythm.     Pulses:          Posterior tibial pulses are  2+ on the right side and 2+ on the left side.     Heart sounds: No murmur heard. Pulmonary:     Effort: Pulmonary effort is normal. No respiratory distress.     Breath sounds: Normal breath sounds.  Abdominal:     Palpations: Abdomen is soft. There is no hepatomegaly or mass.     Tenderness: There is no abdominal tenderness.  Lymphadenopathy:     Cervical: No cervical adenopathy.  Skin:    General: Skin is warm.  Findings: No erythema or rash.  Neurological:     Mental Status: He is alert and oriented to person, place, and time.     Cranial Nerves: No cranial nerve deficit.     Gait: Gait normal.  Psychiatric:        Mood and Affect: Mood and affect normal.    Diabetic Foot Exam - Simple   Simple Foot Form Diabetic Foot exam was performed with the following findings: Yes 09/03/2022 12:02 PM  Visual Inspection See comments: Yes Sensation Testing Intact to touch and monofilament testing bilaterally: Yes Pulse Check Posterior Tibialis and Dorsalis pulse intact bilaterally: Yes Comments Hypertrophic toenails and bunions.    ASSESSMENT AND PLAN:  [email or web address] was seen today for follow-up.  Diagnoses and all orders for this visit: Orders Placed This Encounter  Procedures   Basic metabolic panel   Microalbumin / creatinine urine ratio   VITAMIN D 25 Hydroxy (Vit-D Deficiency, Fractures)   Hemoglobin A1c   Amb Referral to Nutrition and Diabetic Education   Lab Results  Component Value Date   CREATININE 1.33 09/03/2022   BUN 13 09/03/2022   NA 137 09/03/2022   K 4.4 09/03/2022   CL 102 09/03/2022   CO2 28 09/03/2022   Lab Results  Component Value Date   MICROALBUR 5.1 (H) 09/03/2022   Lab Results  Component Value Date   HGBA1C 6.8 (H) 09/03/2022   Hypercalcemia New problem, mild. Further recommendations according to lab results.  Essential hypertension We reviewed prior visits, he has been on Cardura and amlodipine was recommended in 04/2020 but he did not  take it. BP rechecked and mildly elevated at 152/65. He agrees with trying amlodipine 2.5 mg daily. Recommend low-salt/DASH diet. Continue monitoring BP regularly. Follow-up in 2 months, before if needed.  Diabetes mellitus ([place]) HgA1C was alcohol in 10/2021, 6.6. Continue nonpharmacologic treatment. Annual eye exam and foot care recommended. F/U in 5-6 months.  Stage 3a chronic kidney disease ([place]) Problem has been stable. Cr 1.2-1.3 and e GFR high 50's. We discussed Dx and goals of care. Continue adequate hydration,low salt diet,and avoidance of NSAID's.  Return in about 2 months (around 11/03/2022).  Vonzella Althaus G. Martinique, MD  [facility]. [place] office.

## 2022-09-03 ENCOUNTER — Encounter: Payer: Self-pay | Admitting: Family Medicine

## 2022-09-03 ENCOUNTER — Other Ambulatory Visit (HOSPITAL_COMMUNITY): Payer: Self-pay

## 2022-09-03 ENCOUNTER — Ambulatory Visit (INDEPENDENT_AMBULATORY_CARE_PROVIDER_SITE_OTHER): Payer: Medicare HMO | Admitting: Family Medicine

## 2022-09-03 VITALS — BP 128/70 | HR 67 | Resp 12 | Ht 72.0 in | Wt 183.5 lb

## 2022-09-03 DIAGNOSIS — E1169 Type 2 diabetes mellitus with other specified complication: Secondary | ICD-10-CM

## 2022-09-03 DIAGNOSIS — N1831 Chronic kidney disease, stage 3a: Secondary | ICD-10-CM | POA: Diagnosis not present

## 2022-09-03 DIAGNOSIS — I10 Essential (primary) hypertension: Secondary | ICD-10-CM | POA: Diagnosis not present

## 2022-09-03 LAB — BASIC METABOLIC PANEL
BUN: 13 mg/dL (ref 6–23)
CO2: 28 mEq/L (ref 19–32)
Calcium: 10 mg/dL (ref 8.4–10.5)
Chloride: 102 mEq/L (ref 96–112)
Creatinine, Ser: 1.33 mg/dL (ref 0.40–1.50)
GFR: 51.8 mL/min — ABNORMAL LOW (ref >60.00)
Glucose, Bld: 71 mg/dL (ref 70–99)
Potassium: 4.4 mEq/L (ref 3.5–5.1)
Sodium: 137 mEq/L (ref 135–145)

## 2022-09-03 LAB — MICROALBUMIN / CREATININE URINE RATIO
Creatinine,U: 191.8 mg/dL
Microalb Creat Ratio: 2.6 mg/g (ref 0.0–30.0)
Microalb, Ur: 5.1 mg/dL — ABNORMAL HIGH (ref 0.0–1.9)

## 2022-09-03 LAB — VITAMIN D 25 HYDROXY (VIT D DEFICIENCY, FRACTURES): VITD: 28.17 ng/mL — ABNORMAL LOW (ref 30.00–100.00)

## 2022-09-03 LAB — HEMOGLOBIN A1C: Hgb A1c MFr Bld: 6.8 % — ABNORMAL HIGH (ref 4.6–6.5)

## 2022-09-03 MED ORDER — AMLODIPINE BESYLATE 2.5 MG PO TABS
2.5000 mg | ORAL_TABLET | Freq: Every day | ORAL | 1 refills | Status: DC
  Filled 2022-09-03: qty 30, 30d supply, fill #0
  Filled 2022-09-30: qty 30, 30d supply, fill #1

## 2022-09-03 NOTE — Assessment & Plan Note (Addendum)
We reviewed prior visits, he has been on Cardura and amlodipine was recommended in 04/2020 but he did not take it. BP rechecked and mildly elevated at 152/65. He agrees with trying amlodipine 2.5 mg daily. Recommend low-salt/DASH diet. Continue monitoring BP regularly. Follow-up in 2 months, before if needed.

## 2022-09-03 NOTE — Assessment & Plan Note (Signed)
HgA1C was alcohol in 10/2021, 6.6. Continue nonpharmacologic treatment. Annual eye exam and foot care recommended. F/U in 5-6 months.

## 2022-09-03 NOTE — Assessment & Plan Note (Signed)
Problem has been stable. Cr 1.2-1.3 and e GFR high 50's. We discussed Dx and goals of care. Continue adequate hydration,low salt diet,and avoidance of NSAID's.

## 2022-09-03 NOTE — Patient Instructions (Signed)
A few things to remember from today's visit:  Essential hypertension - Plan: Basic metabolic panel, amLODipine (NORVASC) 2.5 MG tablet  Hypercalcemia - Plan: Basic metabolic panel, VITAMIN D 25 Hydroxy (Vit-D Deficiency, Fractures)  Type 2 diabetes mellitus with other specified complication, without long-term current use of insulin (HCC) - Plan: Microalbumin / creatinine urine ratio, Hemoglobin A1c  If you need refills for medications you take chronically, please call your pharmacy. Do not use My Chart to request refills or for acute issues that need immediate attention. If you send a my chart message, it may take a few days to be addressed, specially if I am not in the office.  Please be sure medication list is accurate. If a new problem present, please set up appointment sooner than planned today.  Today we are starting Amlodipine low dose, 2.5 mg to take daily. Continue monitoring blood pressure at home. I will see you back in 2 months, before if needed.

## 2022-09-04 ENCOUNTER — Ambulatory Visit (INDEPENDENT_AMBULATORY_CARE_PROVIDER_SITE_OTHER): Payer: Medicare HMO

## 2022-09-04 ENCOUNTER — Ambulatory Visit: Payer: Medicare HMO

## 2022-09-04 VITALS — Ht 72.0 in | Wt 183.0 lb

## 2022-09-04 DIAGNOSIS — Z0000 Encounter for general adult medical examination without abnormal findings: Secondary | ICD-10-CM | POA: Diagnosis not present

## 2022-09-04 NOTE — Progress Notes (Signed)
Subjective:   Willie Parks is a 76 y.o. male who presents for Medicare Annual/Subsequent preventive examination.  Review of Systems    Virtual Visit via Telephone Note  I connected with  Willie Parks on 09/04/22 at  8:15 AM EDT by telephone and verified that I am speaking with the correct person using two identifiers.  Location: Patient: Home Provider: Office Persons participating in the virtual visit: patient/Nurse Health Advisor   I discussed the limitations, risks, security and privacy concerns of performing an evaluation and management service by telephone and the availability of in person appointments. The patient expressed understanding and agreed to proceed.  Interactive audio and video telecommunications were attempted between this nurse and patient, however failed, due to patient having technical difficulties OR patient did not have access to video capability.  We continued and completed visit with audio only.  Some vital signs may be absent or patient reported.   Criselda Peaches, LPN  Cardiac Risk Factors include: advanced age (>45mn, >>35women);male gender;hypertension     Objective:    Today's Vitals   09/04/22 0826  Weight: 183 lb (83 kg)  Height: 6' (1.829 m)   Body mass index is 24.82 kg/m.     09/04/2022    8:33 AM 03/16/2022    9:47 AM 08/28/2021    8:10 AM 10/08/2018   10:04 AM 06/18/2017    5:18 PM  Advanced Directives  Does Patient Have a Medical Advance Directive? No No No No No  Would patient like information on creating a medical advance directive? No - Patient declined  Yes (MAU/Ambulatory/Procedural Areas - Information given)      Current Medications (verified) Outpatient Encounter Medications as of 09/04/2022  Medication Sig   amLODipine (NORVASC) 2.5 MG tablet Take 1 tablet (2.5 mg total) by mouth daily.   atorvastatin (LIPITOR) 20 MG tablet TAKE 1 TABLET BY MOUTH EVERY OTHER DAY   Calcium Carbonate-Vitamin D (CALCARB 600/D PO) Take by  mouth daily.   fluticasone-salmeterol (ADVAIR DISKUS) 250-50 MCG/ACT AEPB Inhale 1 puff into the lungs twice daily   No facility-administered encounter medications on file as of 09/04/2022.    Allergies (verified) Penicillins   History: Past Medical History:  Diagnosis Date   Allergy    Asthma    Chicken pox    Hyperlipidemia    Prostate cancer ([place]    Past Surgical History:  Procedure Laterality Date   KNEE ARTHROSCOPY Right    WRIST SURGERY Right    Family History  Problem Relation Age of Onset   Cancer Neg Hx    Diabetes Neg Hx    Prostate cancer Neg Hx    Colon cancer Neg Hx    Breast cancer Neg Hx    Social History   Socioeconomic History   Marital status: Married    Spouse name: Not on file   Number of children: Not on file   Years of education: Not on file   Highest education level: Not on file  Occupational History   Occupation: retired  Tobacco Use   Smoking status: Never   Smokeless tobacco: Never  Vaping Use   Vaping Use: Never used  Substance and Sexual Activity   Alcohol use: Not Currently    Alcohol/week: 1.0 standard drink of alcohol    Types: 1 Standard drinks or equivalent per week    Comment: per week   Drug use: No   Sexual activity: Not Currently  Other Topics Concern   Not on file  Social History Narrative   Second marriage. Resides with wife in [place]. Retired. Has one grown child. 15 grandchildren to include his step grandchildren.    10-08-18 Unable to ask abuse questions wife with him today.   Social Determinants of Health   Financial Resource Strain: Low Risk  (09/04/2022)   Overall Financial Resource Strain (CARDIA)    Difficulty of Paying Living Expenses: Not hard at all  Food Insecurity: No Food Insecurity (09/04/2022)   Hunger Vital Sign    Worried About Running Out of Food in the Last Year: Never true    Ran Out of Food in the Last Year: Never true  Transportation Needs: No Transportation Needs (09/04/2022)   PRAPARE  - Hydrologist (Medical): No    Lack of Transportation (Non-Medical): No  Physical Activity: Inactive (09/04/2022)   Exercise Vital Sign    Days of Exercise per Week: 0 days    Minutes of Exercise per Session: 0 min  Stress: No Stress Concern Present (09/04/2022)   [place]    Feeling of Stress : Not at all  Social Connections: [place] (09/04/2022)   Social Connection and Isolation Panel [NHANES]    Frequency of Communication with Friends and Family: More than three times a week    Frequency of Social Gatherings with Friends and Family: More than three times a week    Attends Religious Services: More than 4 times per year    Active Member of [employer] or Organizations: Yes    Attends Music therapist: More than 4 times per year    Marital Status: Married    Tobacco Counseling Counseling given: Not Answered   Clinical Intake:  Pre-visit preparation completed: No  Pain : No/denies pain     BMI - recorded: 24.82 Nutritional Status: BMI of 19-24  Normal Nutritional Risks: None Diabetes: No  How often do you need to have someone help you when you read instructions, pamphlets, or other written materials from your doctor or pharmacy?: 1 - Never  Diabetic?  None  Interpreter Needed?: No  Information entered by :: Rolene Arbour LPN   Activities of Daily Living    09/04/2022    8:31 AM  In your present state of health, do you have any difficulty performing the following activities:  Hearing? 0  Vision? 0  Difficulty concentrating or making decisions? 0  Walking or climbing stairs? 0  Dressing or bathing? 0  Doing errands, shopping? 0  Preparing Food and eating ? N  Using the Toilet? N  In the past six months, have you accidently leaked urine? N  Do you have problems with loss of bowel control? N  Managing your Medications? N  Managing your Finances? N   Housekeeping or managing your Housekeeping? N    Patient Care Team: [place], Betty G, MD as PCP - General (Family Medicine)  Indicate any recent Medical Services you may have received from other than [facility] providers in the past year (date may be approximate).     Assessment:   This is a routine wellness examination for Willie Parks.  Hearing/Vision screen Hearing Screening - Comments:: Denies hearing difficulties   Vision Screening - Comments:: Wears rx glasses - up to date with routine eye exams with  [place] issues and exercise activities discussed: Current Exercise Habits: Home exercise routine, Exercise limited by: None identified   Goals Addressed  This Visit's Progress     Patient stated (pt-stated)        I want to pay my medical bills       Depression Screen    09/04/2022    8:29 AM 09/03/2022   11:08 AM 08/06/2022    1:27 PM 11/12/2021    9:06 PM 08/28/2021    8:09 AM 11/09/2020    8:19 PM 05/08/2020   11:08 AM  PHQ 2/9 Scores  PHQ - 2 Score 0 0 3 0 0 0 0  PHQ- 9 Score 0 1 7        Fall Risk    09/04/2022    8:31 AM 09/03/2022   11:08 AM 08/06/2022    1:26 PM 11/12/2021    9:06 PM 08/28/2021    8:11 AM  [place] in the past year? 0 0 0 0 0  Number falls in past yr: 0 0 0 0 0  Injury with Fall? 0 0 0 0 0  Risk for fall due to : No Fall Risks Other (Comment) No Fall Risks  Impaired vision  Follow up Falls prevention discussed Falls evaluation completed Falls evaluation completed Education provided Falls prevention discussed    FALL RISK PREVENTION PERTAINING TO THE HOME:  Any stairs in or around the home? Yes  If so, are there any without handrails? No  Home free of loose throw rugs in walkways, pet beds, electrical cords, etc? Yes  Adequate lighting in your home to reduce risk of falls? Yes   ASSISTIVE DEVICES UTILIZED TO PREVENT FALLS:  Life alert? No  Use of a cane, walker or w/c? No  Grab bars in the bathroom?  Yes  Shower chair or bench in shower? No  Elevated toilet seat or a handicapped toilet? No   TIMED UP AND GO:  Was the test performed? No . Audio Visit  Cognitive Function:        09/04/2022    8:33 AM 08/28/2021    8:13 AM  6CIT Screen  What Year? 0 points 0 points  What month? 0 points 0 points  What time? 0 points 0 points  Count back from 20 0 points 0 points  Months in reverse 0 points 4 points  Repeat phrase 0 points 6 points  Total Score 0 points 10 points    Immunizations Immunization History  Administered Date(s) Administered   Fluad Quad(high Dose 65+) 09/24/2019, 11/06/2020, 11/12/2021   Influenza, High Dose Seasonal PF 01/30/2018   Influenza,inj,Quad PF,6+ Mos 01/22/2019   PFIZER(Purple Top)SARS-COV-2 Vaccination 03/23/2020, 04/18/2020, 12/30/2020      Flu Vaccine status: Up to date  Pneumococcal vaccine status: Up to date  Covid-19 vaccine status: Completed vaccines  Qualifies for Shingles Vaccine? Yes   Zostavax completed No   Shingrix Completed?: No.    Education has been provided regarding the importance of this vaccine. Patient has been advised to call insurance company to determine out of pocket expense if they have not yet received this vaccine. Advised may also receive vaccine at local pharmacy or Health Dept. Verbalized acceptance and understanding.  Screening Tests Health Maintenance  Topic Date Due   OPHTHALMOLOGY EXAM  Never done   COVID-19 Vaccine (4 - Pfizer risk series) 09/20/2022 (Originally 02/24/2021)   Zoster Vaccines- Shingrix (1 of 2) 12/04/2022 (Originally 10/28/1965)   INFLUENZA VACCINE  03/23/2023 (Originally 07/23/2022)   HEMOGLOBIN A1C  03/04/2023   FOOT EXAM  09/04/2023   URINE MICROALBUMIN  09/04/2023  Hepatitis C Screening  Completed   HPV VACCINES  Aged Out   Pneumonia Vaccine 36+ Years old  Discontinued   TETANUS/TDAP  Discontinued    Health Maintenance  Health Maintenance Due  Topic Date Due   OPHTHALMOLOGY EXAM   Never done    Colorectal cancer screening: No longer required.   Lung Cancer Screening: (Low Dose CT Chest recommended if Age 53-80 years, 30 pack-year currently smoking OR have quit w/in 15years.) does not qualify.     Additional Screening:  Hepatitis C Screening: does qualify; Completed 06/23/18  Vision Screening: Recommended annual ophthalmology exams for early detection of glaucoma and other disorders of the eye. Is the patient up to date with their annual eye exam?  Yes  Who is the provider or what is the name of the office in which the patient attends annual eye exams? [facility] If pt is not established with a provider, would they like to be referred to a provider to establish care? No .   Dental Screening: Recommended annual dental exams for proper oral hygiene  Community Resource Referral / Chronic Care Management:  CRR required this visit?  No   CCM required this visit?  No      Plan:     I have personally reviewed and noted the following in the patient's chart:   Medical and social history Use of alcohol, tobacco or illicit drugs  Current medications and supplements including opioid prescriptions. Patient is not currently taking opioid prescriptions. Functional ability and status Nutritional status Physical activity Advanced directives List of other physicians Hospitalizations, surgeries, and ER visits in previous 12 months Vitals Screenings to include cognitive, depression, and falls Referrals and appointments  In addition, I have reviewed and discussed with patient certain preventive protocols, quality metrics, and best practice recommendations. A written personalized care plan for preventive services as well as general preventive health recommendations were provided to patient.     Criselda Peaches, LPN   3/41/9622   Nurse Notes: None

## 2022-09-04 NOTE — Patient Instructions (Addendum)
[name] , Thank you for taking time to come for your Medicare Wellness Visit. I appreciate your ongoing commitment to your health goals. Please review the following plan we discussed and let me know if I can assist you in the future.   Screening recommendations/referrals: Colonoscopy: No longer required Recommended yearly ophthalmology/optometry visit for glaucoma screening and checkup Recommended yearly dental visit for hygiene and checkup  Vaccinations: Influenza vaccine: Up to date Pneumococcal vaccine: Up to date  Shingles vaccine: Deferred   Covid-19: Done  Advanced directives: Advance directive discussed with you today. Even though you declined this today, please call our office should you change your mind, and we can give you the proper paperwork for you to fill out.   Conditions/risks identified: None  Next appointment: Follow up in one year for your annual wellness visit.    Preventive Care 77 Years and Older, Male  Preventive care refers to lifestyle choices and visits with your health care provider that can promote health and wellness. What does preventive care include? A yearly physical exam. This is also called an annual well check. Dental exams once or twice a year. Routine eye exams. Ask your health care provider how often you should have your eyes checked. Personal lifestyle choices, including: Daily care of your teeth and gums. Regular physical activity. Eating a healthy diet. Avoiding tobacco and drug use. Limiting alcohol use. Practicing safe sex. Taking low doses of aspirin every day. Taking vitamin and mineral supplements as recommended by your health care provider. What happens during an annual well check? The services and screenings done by your health care provider during your annual well check will depend on your age, overall health, lifestyle risk factors, and family history of disease. Counseling  Your health care provider may ask you questions  about your: Alcohol use. Tobacco use. Drug use. Emotional well-being. Home and relationship well-being. Sexual activity. Eating habits. History of falls. Memory and ability to understand (cognition). Work and work Statistician. Screening  You may have the following tests or measurements: Height, weight, and BMI. Blood pressure. Lipid and cholesterol levels. These may be checked every 5 years, or more frequently if you are over 77 years old. Skin check. Lung cancer screening. You may have this screening every year starting at age 49 if you have a 30-pack-year history of smoking and currently smoke or have quit within the past 15 years. Fecal occult blood test (FOBT) of the stool. You may have this test every year starting at age 68. Flexible sigmoidoscopy or colonoscopy. You may have a sigmoidoscopy every 5 years or a colonoscopy every 10 years starting at age 66. Prostate cancer screening. Recommendations will vary depending on your family history and other risks. Hepatitis C blood test. Hepatitis B blood test. Sexually transmitted disease (STD) testing. Diabetes screening. This is done by checking your blood sugar (glucose) after you have not eaten for a while (fasting). You may have this done every 1-3 years. Abdominal aortic aneurysm (AAA) screening. You may need this if you are a current or former smoker. Osteoporosis. You may be screened starting at age 56 if you are at high risk. Talk with your health care provider about your test results, treatment options, and if necessary, the need for more tests. Vaccines  Your health care provider may recommend certain vaccines, such as: Influenza vaccine. This is recommended every year. Tetanus, diphtheria, and acellular pertussis (Tdap, Td) vaccine. You may need a Td booster every 10 years. Zoster vaccine. You may  need this after age 70. Pneumococcal 13-valent conjugate (PCV13) vaccine. One dose is recommended after age 58. Pneumococcal  polysaccharide (PPSV23) vaccine. One dose is recommended after age 66. Talk to your health care provider about which screenings and vaccines you need and how often you need them. This information is not intended to replace advice given to you by your health care provider. Make sure you discuss any questions you have with your health care provider. Document Released: 01/05/2016 Document Revised: 08/28/2016 Document Reviewed: 10/10/2015 Elsevier Interactive Patient Education  2017 [place] Prevention in the Home Falls can cause injuries. They can happen to people of all ages. There are many things you can do to make your home safe and to help prevent falls. What can I do on the outside of my home? Regularly fix the edges of walkways and driveways and fix any cracks. Remove anything that might make you trip as you walk through a door, such as a raised step or threshold. Trim any bushes or trees on the path to your home. Use bright outdoor lighting. Clear any walking paths of anything that might make someone trip, such as rocks or tools. Regularly check to see if handrails are loose or broken. Make sure that both sides of any steps have handrails. Any raised decks and porches should have guardrails on the edges. Have any leaves, snow, or ice cleared regularly. Use sand or salt on walking paths during winter. Clean up any spills in your garage right away. This includes oil or grease spills. What can I do in the bathroom? Use night lights. Install grab bars by the toilet and in the tub and shower. Do not use towel bars as grab bars. Use non-skid mats or decals in the tub or shower. If you need to sit down in the shower, use a plastic, non-slip stool. Keep the floor dry. Clean up any water that spills on the floor as soon as it happens. Remove soap buildup in the tub or shower regularly. Attach bath mats securely with double-sided non-slip rug tape. Do not have throw rugs and other  things on the floor that can make you trip. What can I do in the bedroom? Use night lights. Make sure that you have a light by your bed that is easy to reach. Do not use any sheets or blankets that are too big for your bed. They should not hang down onto the floor. Have a firm chair that has side arms. You can use this for support while you get dressed. Do not have throw rugs and other things on the floor that can make you trip. What can I do in the kitchen? Clean up any spills right away. Avoid walking on wet floors. Keep items that you use a lot in easy-to-reach places. If you need to reach something above you, use a strong step stool that has a grab bar. Keep electrical cords out of the way. Do not use floor polish or wax that makes floors slippery. If you must use wax, use non-skid floor wax. Do not have throw rugs and other things on the floor that can make you trip. What can I do with my stairs? Do not leave any items on the stairs. Make sure that there are handrails on both sides of the stairs and use them. Fix handrails that are broken or loose. Make sure that handrails are as long as the stairways. Check any carpeting to make sure that it is firmly attached  to the stairs. Fix any carpet that is loose or worn. Avoid having throw rugs at the top or bottom of the stairs. If you do have throw rugs, attach them to the floor with carpet tape. Make sure that you have a light switch at the top of the stairs and the bottom of the stairs. If you do not have them, ask someone to add them for you. What else can I do to help prevent falls? Wear shoes that: Do not have high heels. Have rubber bottoms. Are comfortable and fit you well. Are closed at the toe. Do not wear sandals. If you use a stepladder: Make sure that it is fully opened. Do not climb a closed stepladder. Make sure that both sides of the stepladder are locked into place. Ask someone to hold it for you, if possible. Clearly  mark and make sure that you can see: Any grab bars or handrails. First and last steps. Where the edge of each step is. Use tools that help you move around (mobility aids) if they are needed. These include: Canes. Walkers. Scooters. Crutches. Turn on the lights when you go into a dark area. Replace any light bulbs as soon as they burn out. Set up your furniture so you have a clear path. Avoid moving your furniture around. If any of your floors are uneven, fix them. If there are any pets around you, be aware of where they are. Review your medicines with your doctor. Some medicines can make you feel dizzy. This can increase your chance of falling. Ask your doctor what other things that you can do to help prevent falls. This information is not intended to replace advice given to you by your health care provider. Make sure you discuss any questions you have with your health care provider. Document Released: 10/05/2009 Document Revised: 05/16/2016 Document Reviewed: 01/13/2015 Elsevier Interactive Patient Education  2017 [employer].

## 2022-09-26 DIAGNOSIS — H5213 Myopia, bilateral: Secondary | ICD-10-CM | POA: Diagnosis not present

## 2022-09-30 ENCOUNTER — Other Ambulatory Visit (HOSPITAL_COMMUNITY): Payer: Self-pay

## 2022-10-10 ENCOUNTER — Ambulatory Visit (INDEPENDENT_AMBULATORY_CARE_PROVIDER_SITE_OTHER): Payer: Medicare HMO

## 2022-10-10 DIAGNOSIS — Z23 Encounter for immunization: Secondary | ICD-10-CM | POA: Diagnosis not present

## 2022-10-14 ENCOUNTER — Other Ambulatory Visit (HOSPITAL_COMMUNITY): Payer: Self-pay

## 2022-10-28 ENCOUNTER — Other Ambulatory Visit (HOSPITAL_COMMUNITY): Payer: Self-pay

## 2022-10-28 ENCOUNTER — Other Ambulatory Visit: Payer: Self-pay | Admitting: Family Medicine

## 2022-10-28 DIAGNOSIS — I10 Essential (primary) hypertension: Secondary | ICD-10-CM

## 2022-10-28 MED ORDER — AMLODIPINE BESYLATE 2.5 MG PO TABS
2.5000 mg | ORAL_TABLET | Freq: Every day | ORAL | 1 refills | Status: DC
  Filled 2022-10-28: qty 30, 30d supply, fill #0
  Filled 2022-12-09 (×2): qty 30, 30d supply, fill #1

## 2022-11-07 ENCOUNTER — Other Ambulatory Visit (HOSPITAL_COMMUNITY): Payer: Self-pay

## 2022-12-06 DIAGNOSIS — R0683 Snoring: Secondary | ICD-10-CM | POA: Diagnosis not present

## 2022-12-06 DIAGNOSIS — G478 Other sleep disorders: Secondary | ICD-10-CM | POA: Diagnosis not present

## 2022-12-06 DIAGNOSIS — E1142 Type 2 diabetes mellitus with diabetic polyneuropathy: Secondary | ICD-10-CM | POA: Diagnosis not present

## 2022-12-09 ENCOUNTER — Other Ambulatory Visit (HOSPITAL_COMMUNITY): Payer: Self-pay

## 2022-12-09 ENCOUNTER — Other Ambulatory Visit: Payer: Self-pay

## 2022-12-18 DIAGNOSIS — G4733 Obstructive sleep apnea (adult) (pediatric): Secondary | ICD-10-CM | POA: Diagnosis not present

## 2022-12-19 ENCOUNTER — Other Ambulatory Visit (HOSPITAL_COMMUNITY): Payer: Self-pay

## 2022-12-19 DIAGNOSIS — H2511 Age-related nuclear cataract, right eye: Secondary | ICD-10-CM | POA: Diagnosis not present

## 2022-12-19 DIAGNOSIS — H18413 Arcus senilis, bilateral: Secondary | ICD-10-CM | POA: Diagnosis not present

## 2022-12-19 DIAGNOSIS — H40013 Open angle with borderline findings, low risk, bilateral: Secondary | ICD-10-CM | POA: Diagnosis not present

## 2022-12-19 DIAGNOSIS — H25013 Cortical age-related cataract, bilateral: Secondary | ICD-10-CM | POA: Diagnosis not present

## 2022-12-19 DIAGNOSIS — H2513 Age-related nuclear cataract, bilateral: Secondary | ICD-10-CM | POA: Diagnosis not present

## 2022-12-19 MED ORDER — PREDNISOLONE ACETATE 1 % OP SUSP
1.0000 [drp] | Freq: Four times a day (QID) | OPHTHALMIC | 1 refills | Status: DC
  Filled 2022-12-19 – 2022-12-28 (×2): qty 5, 25d supply, fill #0

## 2022-12-19 MED ORDER — KETOROLAC TROMETHAMINE 0.5 % OP SOLN
1.0000 [drp] | Freq: Four times a day (QID) | OPHTHALMIC | 1 refills | Status: DC
  Filled 2022-12-19 – 2022-12-28 (×2): qty 5, 25d supply, fill #0

## 2022-12-19 MED ORDER — MOXIFLOXACIN HCL 0.5 % OP SOLN
1.0000 [drp] | Freq: Four times a day (QID) | OPHTHALMIC | 1 refills | Status: DC
  Filled 2022-12-19: qty 3, 15d supply, fill #0

## 2022-12-20 ENCOUNTER — Other Ambulatory Visit (HOSPITAL_COMMUNITY): Payer: Self-pay

## 2022-12-24 ENCOUNTER — Other Ambulatory Visit (HOSPITAL_COMMUNITY): Payer: Self-pay

## 2022-12-24 DIAGNOSIS — H2513 Age-related nuclear cataract, bilateral: Secondary | ICD-10-CM | POA: Diagnosis not present

## 2022-12-24 DIAGNOSIS — H18413 Arcus senilis, bilateral: Secondary | ICD-10-CM | POA: Diagnosis not present

## 2022-12-24 DIAGNOSIS — H40013 Open angle with borderline findings, low risk, bilateral: Secondary | ICD-10-CM | POA: Diagnosis not present

## 2022-12-24 DIAGNOSIS — H25013 Cortical age-related cataract, bilateral: Secondary | ICD-10-CM | POA: Diagnosis not present

## 2022-12-24 DIAGNOSIS — H2511 Age-related nuclear cataract, right eye: Secondary | ICD-10-CM | POA: Diagnosis not present

## 2022-12-28 ENCOUNTER — Other Ambulatory Visit (HOSPITAL_COMMUNITY): Payer: Self-pay

## 2022-12-30 ENCOUNTER — Other Ambulatory Visit (HOSPITAL_COMMUNITY): Payer: Self-pay

## 2022-12-31 ENCOUNTER — Other Ambulatory Visit (HOSPITAL_COMMUNITY): Payer: Self-pay

## 2023-01-06 ENCOUNTER — Other Ambulatory Visit (HOSPITAL_COMMUNITY): Payer: Self-pay

## 2023-01-06 ENCOUNTER — Other Ambulatory Visit: Payer: Self-pay | Admitting: Family Medicine

## 2023-01-06 DIAGNOSIS — I10 Essential (primary) hypertension: Secondary | ICD-10-CM

## 2023-01-06 MED ORDER — AMLODIPINE BESYLATE 2.5 MG PO TABS
2.5000 mg | ORAL_TABLET | Freq: Every day | ORAL | 1 refills | Status: DC
  Filled 2023-01-06: qty 30, 30d supply, fill #0
  Filled 2023-02-07: qty 30, 30d supply, fill #1

## 2023-01-15 ENCOUNTER — Encounter: Payer: Self-pay | Admitting: Family Medicine

## 2023-01-15 ENCOUNTER — Ambulatory Visit (INDEPENDENT_AMBULATORY_CARE_PROVIDER_SITE_OTHER): Payer: Medicare HMO | Admitting: Family Medicine

## 2023-01-15 ENCOUNTER — Other Ambulatory Visit (HOSPITAL_COMMUNITY): Payer: Self-pay

## 2023-01-15 VITALS — BP 120/58 | HR 55 | Temp 97.6°F | Ht 72.0 in | Wt 182.0 lb

## 2023-01-15 DIAGNOSIS — R051 Acute cough: Secondary | ICD-10-CM | POA: Diagnosis not present

## 2023-01-15 MED ORDER — LORATADINE 10 MG PO TABS
10.0000 mg | ORAL_TABLET | Freq: Every day | ORAL | 11 refills | Status: DC
  Filled 2023-01-15: qty 30, 30d supply, fill #0

## 2023-01-15 MED ORDER — DM-GUAIFENESIN ER 30-600 MG PO TB12
1.0000 | ORAL_TABLET | Freq: Two times a day (BID) | ORAL | 0 refills | Status: DC
  Filled 2023-01-15: qty 30, 15d supply, fill #0

## 2023-01-15 NOTE — Progress Notes (Signed)
Established Patient Office Visit  Subjective   Patient ID: Willie Parks, male    DOB: 02-09-1946  Age: 77 y.o. MRN: [id number]  Chief Complaint  Patient presents with   Cough    Productive with clear-yellow sputum x1 week, denies OTC medication    Patient reports 1-2 week history of coughing, states that it is productive of clear sputum. Pt reports no sick contacts, no chest pain or chest tightness. States that he is not having any fever/chills, no sore throat, no nasal congestion. Patient has a history of mild intermittent asthma, is on a twice daily controller inhaler. Wife reports that his inhaler woks well for him, has not had a flare up in a long time. Wife reports that the cough does seem to be improving over time, no other associated symptoms or issues to report.    Current Outpatient Medications  Medication Instructions   amLODipine (NORVASC) 2.5 mg, Oral, Daily   ASPIRIN 81 PO Oral, Daily   atorvastatin (LIPITOR) 20 MG tablet TAKE 1 TABLET BY MOUTH EVERY OTHER DAY   Calcium Carbonate-Vitamin D (CALCARB 600/D PO) Oral, Daily   dextromethorphan-guaiFENesin (MUCINEX DM) 30-600 MG 12hr tablet 1 tablet, Oral, 2 times daily   fluticasone-salmeterol (ADVAIR DISKUS) 250-50 MCG/ACT AEPB Inhale 1 puff into the lungs twice daily   loratadine (CLARITIN) 10 mg, Oral, Daily    Patient Active Problem List   Diagnosis Date Noted   Stage 3a chronic kidney disease ([place]) 09/03/2022   Prediabetes 11/12/2021   Diabetes mellitus ([place]) 11/12/2021   TIA (transient ischemic attack) 06/26/2018   Malignant neoplasm of prostate ([place]) 04/30/2018   Vitamin D deficiency 02/10/2017   BPH associated with nocturia 02/10/2017   Hyperlipidemia 02/10/2017   Essential hypertension 10/08/2016   Mild intermittent asthma, uncomplicated 65/46/5035      Review of Systems  All other systems reviewed and are negative.     Objective:     BP (!) 120/58 (BP Location: Right Arm, Patient Position: Sitting,  Cuff Size: Normal)   Pulse (!) 55   Temp 97.6 F (36.4 C) (Oral)   Ht 6' (1.829 m)   Wt 182 lb (82.6 kg)   SpO2 98%   BMI 24.68 kg/m    Physical Exam Vitals reviewed.  Constitutional:      Appearance: Normal appearance. He is well-groomed and normal weight.  HENT:     Right Ear: Tympanic membrane normal.     Left Ear: Tympanic membrane normal.     Nose: No congestion.     Mouth/Throat:     Mouth: Mucous membranes are moist.     Pharynx: No posterior oropharyngeal erythema.  Eyes:     Conjunctiva/sclera: Conjunctivae normal.  Cardiovascular:     Rate and Rhythm: Normal rate and regular rhythm.     Heart sounds: S1 normal and S2 normal. No murmur heard. Pulmonary:     Effort: Pulmonary effort is normal.     Breath sounds: Normal breath sounds and air entry. No wheezing or rales.  Neurological:     General: No focal deficit present.     Mental Status: He is alert and oriented to person, place, and time.     Gait: Gait is intact.  Psychiatric:        Mood and Affect: Affect normal.      No results found for any visits on 01/15/23.    The 10-year ASCVD risk score (Arnett DK, et al., 2019) is: 32.3%    Assessment & Plan:  Problem List Items Addressed This Visit   None Visit Diagnoses     Acute cough    -  Primary   Relevant Medications   dextromethorphan-guaiFENesin (MUCINEX DM) 30-600 MG 12hr tablet   loratadine (CLARITIN) 10 MG tablet     Lungs are clear on exam, even with forced expiration there is no wheezing heard. Most likely patient is experiencing a post viral nasal drainage with cough. I do not see any indication of bacterial infection at this time. I recommended using mucinex DM 30/600 mg BID and claritin 10 mg daily to control the mucus and drainage. I offered to order a chest film to rule out any bacterial infection, however wife declined for now. I advised that if he develops any new fevers, chest pain or SOB that they should contact us and I would  order the chest film.   No follow-ups on file.    Farrel Conners, MD

## 2023-01-22 NOTE — Progress Notes (Signed)
ACUTE VISIT Chief Complaint  Patient presents with   Cough   Follow-up   HPI: [name] is a 77 y.o. male with past medical history significant for asthma, CKD 3, hypertension, prostate cancer, and DM2 here today with his wife complaining of persistent cough and new onset of fever. persistent cough for four weeks. He was evaluated for cough on January 24th/2024. He reports that the cough is productive, but he is not able to cough up any phlegm.  His wife reports a low-grade fever of 101.81F that started last night, which has been managed with Tylenol, last taken today around 5 Am. His granddaughter had a similar cough and fever a couple of weeks ago.  He denies experiencing shortness of breath, wheezing, palpitations,or chest pain.   He also reports feeling chills and a mild sore throat.  He denies any nasal congestion or rhinorrhea.  His appetite remains "good", and he reports normal urination and bowel movements.  Fever  This is a new problem. The current episode started yesterday. The problem occurs intermittently. The problem has been unchanged. The maximum temperature noted was 101 to 101.9 F. Pertinent negatives include no abdominal pain, congestion, ear pain, headaches, muscle aches, nausea, rash, urinary pain, vomiting or wheezing. He has tried acetaminophen for the symptoms. The treatment provided mild relief.  Risk factors: hx of cancer and sick contacts   Risk factors: no contaminated food, no contaminated water and no recent travel    C/O left arm pain for a couple of weeks, which is constant and exacerbated by movement.  No hx of trauma. He has not noted edema or erythema. Mild left shoulder limitation of movement. He has not taking OTC medications.  DM II: He is on nonpharmacologic treatment. Dx'ed in 10/2021.  His wife would like A1c done today. Negative for polydipsia, polyuria, polyphagia. He is not checking BS.  Lab Results  Component Value Date    HGBA1C 6.8 (H) 09/03/2022   Lab Results  Component Value Date   MICROALBUR 5.1 (H) 09/03/2022  Hypertension on amlodipine 2.5 mg daily. CKD 3: He has not noted form in urine, gross hematuria, or decreased urine output.  Lab Results  Component Value Date   CREATININE 1.33 09/03/2022   BUN 13 09/03/2022   NA 137 09/03/2022   K 4.4 09/03/2022   CL 102 09/03/2022   CO2 28 09/03/2022   Review of Systems  Constitutional:  Positive for activity change, chills, fatigue and fever.  HENT:  Negative for congestion, ear pain, mouth sores and trouble swallowing.   Respiratory:  Negative for wheezing.   Gastrointestinal:  Negative for abdominal pain, nausea and vomiting.  Genitourinary:  Negative for difficulty urinating and dysuria.  Musculoskeletal:  Positive for arthralgias and gait problem.  Skin:  Negative for rash.  Allergic/Immunologic: Positive for environmental allergies.  Neurological:  Negative for syncope and headaches.  See other pertinent positives and negatives in HPI.  Current Outpatient Medications on File Prior to Visit  Medication Sig Dispense Refill   amLODipine (NORVASC) 2.5 MG tablet Take 1 tablet (2.5 mg total) by mouth daily. 30 tablet 1   ASPIRIN 81 PO Take by mouth daily.     atorvastatin (LIPITOR) 20 MG tablet TAKE 1 TABLET BY MOUTH EVERY OTHER DAY 90 tablet 3   Calcium Carbonate-Vitamin D (CALCARB 600/D PO) Take by mouth daily.     dextromethorphan-guaiFENesin (MUCINEX DM) 30-600 MG 12hr tablet Take 1 tablet by mouth 2 (two) times daily. 30 tablet 0  fluticasone-salmeterol (ADVAIR DISKUS) 250-50 MCG/ACT AEPB Inhale 1 puff into the lungs twice daily 180 each 2   loratadine (CLARITIN) 10 MG tablet Take 1 tablet (10 mg total) by mouth daily. 30 tablet 11   No current facility-administered medications on file prior to visit.   Past Medical History:  Diagnosis Date   Allergy    Asthma    Chicken pox    Hyperlipidemia    Prostate cancer (HCC)    Allergies   Allergen Reactions   Penicillins Other (See Comments)    Dizziness    Social History   Socioeconomic History   Marital status: Married    Spouse name: Not on file   Number of children: Not on file   Years of education: Not on file   Highest education level: Not on file  Occupational History   Occupation: retired  Tobacco Use   Smoking status: Never   Smokeless tobacco: Never  Vaping Use   Vaping Use: Never used  Substance and Sexual Activity   Alcohol use: Not Currently    Alcohol/week: 1.0 standard drink of alcohol    Types: 1 Standard drinks or equivalent per week    Comment: per week   Drug use: No   Sexual activity: Not Currently  Other Topics Concern   Not on file  Social History Narrative   Second marriage. Resides with wife in [place]. Retired. Has one grown child. 15 grandchildren to include his step grandchildren.    10-08-18 Unable to ask abuse questions wife with him today.   Social Determinants of Health   Financial Resource Strain: Low Risk  (09/04/2022)   Overall Financial Resource Strain (CARDIA)    Difficulty of Paying Living Expenses: Not hard at all  Food Insecurity: No Food Insecurity (09/04/2022)   Hunger Vital Sign    Worried About Running Out of Food in the Last Year: Never true    Ran Out of Food in the Last Year: Never true  Transportation Needs: No Transportation Needs (09/04/2022)   PRAPARE - Hydrologist (Medical): No    Lack of Transportation (Non-Medical): No  Physical Activity: Inactive (09/04/2022)   Exercise Vital Sign    Days of Exercise per Week: 0 days    Minutes of Exercise per Session: 0 min  Stress: No Stress Concern Present (09/04/2022)   [place]    Feeling of Stress : Not at all  Social Connections: [place] (09/04/2022)   Social Connection and Isolation Panel [NHANES]    Frequency of Communication with Friends and  Family: More than three times a week    Frequency of Social Gatherings with Friends and Family: More than three times a week    Attends Religious Services: More than 4 times per year    Active Member of [employer] or Organizations: Yes    Attends Archivist Meetings: More than 4 times per year    Marital Status: Married   Vitals:   01/24/23 1047  BP: 120/60  Pulse: 91  Resp: 20  Temp: 98.8 F (37.1 C)  SpO2: 91%  Body mass index is 24.68 kg/m.  Physical Exam Vitals and nursing note reviewed.  Constitutional:      General: He is not in acute distress.    Appearance: He is well-developed. He is ill-appearing. He is not toxic-appearing or diaphoretic.  HENT:     Head: Normocephalic and atraumatic.     Nose:  Rhinorrhea present.     Mouth/Throat:     Mouth: Mucous membranes are moist.     Pharynx: Oropharynx is clear.  Eyes:     Conjunctiva/sclera: Conjunctivae normal.  Cardiovascular:     Rate and Rhythm: Normal rate. Rhythm irregular.     Heart sounds: No murmur heard.    Comments: PT pulses palpable. Pulmonary:     Effort: Pulmonary effort is normal. No respiratory distress.     Breath sounds: No stridor. Examination of the left-lower field reveals rales. Rales (Fine and mild hypoventilation) present. No wheezing or rhonchi.  Musculoskeletal:     Left shoulder: Tenderness present. Decreased range of motion.     Right lower leg: No edema.     Left lower leg: No edema.  Lymphadenopathy:     Cervical: No cervical adenopathy.  Skin:    General: Skin is warm.     Findings: No erythema or rash.  Neurological:     Mental Status: He is alert and oriented to person, place, and time.     Comments:  No focal deficit. Generalized upper tremors,shivery.  Psychiatric:        Mood and Affect: Mood and affect normal.     Comments: Well groomed, good eye contact.     ASSESSMENT AND PLAN: [email or web address] was seen today for cough and follow-up.  Diagnoses and all orders for  this visit:  Fever, unspecified fever cause -     POC COVID-19 -     POCT Influenza A/B  COVID-19 virus infection We discussed Dx,possible complications and treatment options. High risk for complications. He cannot get up from examination table without help and unable to walk w/o assistance. He was sent to the ER via EMS/ambulance.  Persistent cough Since earlier this month, he reports provide to be stable.  According to note from visit on 01/15/2023, chest x-ray was recommended but he declined. Today fine rales heard in left base with mild hyperventilation, concerning for pneumonia.  Irregular heart rate Asymptomatic and in the setting of acute illness, COVID 19 infection. We discussed possible etiologies. EKG with new PVC's.  Left shoulder pain, unspecified chronicity  We discussed possible etiologies of left arm pain, it seems to be musculoskeletal. ?  Rotator cuff. After acute illness resolved, we can consider PT and/or sports medicine evaluation.  Diabetes mellitus ([place]) Hemoglobin A1c has been at goal. His wife wanted this to be checked today but given his acute illness, going to the ER, we can hold until next visit to reepat HgA1C.  New EKG findings, PVC's. He cannot sit up, VS stable. Left base fine rales,possible COVID 19 pneumonia. He is going to need chest imaging and blood work. Call EMS to take pt to the ER.  Return if symptoms worsen or fail to improve, for keep next appointment.  Evren Shankland G. Martinique, MD  [facility]. [place] office.

## 2023-01-24 ENCOUNTER — Encounter (HOSPITAL_COMMUNITY): Payer: Self-pay

## 2023-01-24 ENCOUNTER — Ambulatory Visit (INDEPENDENT_AMBULATORY_CARE_PROVIDER_SITE_OTHER): Payer: Medicare HMO | Admitting: Family Medicine

## 2023-01-24 ENCOUNTER — Encounter: Payer: Self-pay | Admitting: Family Medicine

## 2023-01-24 ENCOUNTER — Other Ambulatory Visit: Payer: Self-pay

## 2023-01-24 ENCOUNTER — Observation Stay (HOSPITAL_COMMUNITY)
Admission: EM | Admit: 2023-01-24 | Discharge: 2023-01-25 | Disposition: A | Discharge status: Home / Self Care | Payer: Medicare HMO | Attending: Family Medicine | Admitting: Family Medicine

## 2023-01-24 ENCOUNTER — Emergency Department (HOSPITAL_COMMUNITY): Payer: Medicare HMO

## 2023-01-24 VITALS — BP 120/60 | HR 91 | Temp 98.8°F | Resp 20 | Ht 72.0 in

## 2023-01-24 DIAGNOSIS — E119 Type 2 diabetes mellitus without complications: Secondary | ICD-10-CM

## 2023-01-24 DIAGNOSIS — J4520 Mild intermittent asthma, uncomplicated: Secondary | ICD-10-CM | POA: Insufficient documentation

## 2023-01-24 DIAGNOSIS — N1831 Chronic kidney disease, stage 3a: Secondary | ICD-10-CM | POA: Insufficient documentation

## 2023-01-24 DIAGNOSIS — Z7982 Long term (current) use of aspirin: Secondary | ICD-10-CM | POA: Diagnosis not present

## 2023-01-24 DIAGNOSIS — U071 COVID-19: Principal | ICD-10-CM | POA: Diagnosis present

## 2023-01-24 DIAGNOSIS — I10 Essential (primary) hypertension: Secondary | ICD-10-CM | POA: Diagnosis present

## 2023-01-24 DIAGNOSIS — E785 Hyperlipidemia, unspecified: Secondary | ICD-10-CM | POA: Diagnosis present

## 2023-01-24 DIAGNOSIS — Z7952 Long term (current) use of systemic steroids: Secondary | ICD-10-CM | POA: Insufficient documentation

## 2023-01-24 DIAGNOSIS — E1122 Type 2 diabetes mellitus with diabetic chronic kidney disease: Secondary | ICD-10-CM | POA: Insufficient documentation

## 2023-01-24 DIAGNOSIS — M25512 Pain in left shoulder: Secondary | ICD-10-CM | POA: Diagnosis not present

## 2023-01-24 DIAGNOSIS — Z79899 Other long term (current) drug therapy: Secondary | ICD-10-CM | POA: Diagnosis not present

## 2023-01-24 DIAGNOSIS — E559 Vitamin D deficiency, unspecified: Secondary | ICD-10-CM | POA: Insufficient documentation

## 2023-01-24 DIAGNOSIS — N401 Enlarged prostate with lower urinary tract symptoms: Secondary | ICD-10-CM | POA: Diagnosis present

## 2023-01-24 DIAGNOSIS — R310 Gross hematuria: Secondary | ICD-10-CM | POA: Diagnosis not present

## 2023-01-24 DIAGNOSIS — R0602 Shortness of breath: Secondary | ICD-10-CM | POA: Diagnosis not present

## 2023-01-24 DIAGNOSIS — Z8546 Personal history of malignant neoplasm of prostate: Secondary | ICD-10-CM | POA: Diagnosis not present

## 2023-01-24 DIAGNOSIS — I499 Cardiac arrhythmia, unspecified: Secondary | ICD-10-CM | POA: Diagnosis not present

## 2023-01-24 DIAGNOSIS — R509 Fever, unspecified: Secondary | ICD-10-CM

## 2023-01-24 DIAGNOSIS — R053 Chronic cough: Secondary | ICD-10-CM

## 2023-01-24 DIAGNOSIS — R059 Cough, unspecified: Secondary | ICD-10-CM | POA: Diagnosis not present

## 2023-01-24 DIAGNOSIS — R5383 Other fatigue: Secondary | ICD-10-CM | POA: Diagnosis not present

## 2023-01-24 DIAGNOSIS — I129 Hypertensive chronic kidney disease with stage 1 through stage 4 chronic kidney disease, or unspecified chronic kidney disease: Secondary | ICD-10-CM | POA: Diagnosis not present

## 2023-01-24 DIAGNOSIS — G459 Transient cerebral ischemic attack, unspecified: Secondary | ICD-10-CM

## 2023-01-24 DIAGNOSIS — E1169 Type 2 diabetes mellitus with other specified complication: Secondary | ICD-10-CM | POA: Diagnosis not present

## 2023-01-24 DIAGNOSIS — R531 Weakness: Secondary | ICD-10-CM | POA: Diagnosis not present

## 2023-01-24 LAB — COMPREHENSIVE METABOLIC PANEL
ALT: 31 U/L (ref 0–44)
AST: 31 U/L (ref 15–41)
Albumin: 4.4 g/dL (ref 3.5–5.0)
Alkaline Phosphatase: 77 U/L (ref 38–126)
Anion gap: 10 (ref 5–15)
BUN: 10 mg/dL (ref 8–23)
CO2: 25 mmol/L (ref 22–32)
Calcium: 9.5 mg/dL (ref 8.9–10.3)
Chloride: 99 mmol/L (ref 98–111)
Creatinine, Ser: 1.41 mg/dL — ABNORMAL HIGH (ref 0.61–1.24)
GFR, Estimated: 51 mL/min — ABNORMAL LOW (ref >60)
Glucose, Bld: 101 mg/dL — ABNORMAL HIGH (ref 70–99)
Potassium: 4.3 mmol/L (ref 3.5–5.1)
Sodium: 134 mmol/L — ABNORMAL LOW (ref 135–145)
Total Bilirubin: 1.2 mg/dL (ref 0.3–1.2)
Total Protein: 7.5 g/dL (ref 6.5–8.1)

## 2023-01-24 LAB — CBC WITH DIFFERENTIAL/PLATELET
Abs Immature Granulocytes: 0.02 10*3/uL (ref 0.00–0.07)
Basophils Absolute: 0 10*3/uL (ref 0.0–0.1)
Basophils Relative: 0 %
Eosinophils Absolute: 0 10*3/uL (ref 0.0–0.5)
Eosinophils Relative: 0 %
HCT: 46 % (ref 39.0–52.0)
Hemoglobin: 14.4 g/dL (ref 13.0–17.0)
Immature Granulocytes: 0 %
Lymphocytes Relative: 9 %
Lymphs Abs: 0.5 10*3/uL — ABNORMAL LOW (ref 0.7–4.0)
MCH: 28.3 pg (ref 26.0–34.0)
MCHC: 31.3 g/dL (ref 30.0–36.0)
MCV: 90.6 fL (ref 80.0–100.0)
Monocytes Absolute: 0.6 10*3/uL (ref 0.1–1.0)
Monocytes Relative: 10 %
Neutro Abs: 4.4 10*3/uL (ref 1.7–7.7)
Neutrophils Relative %: 81 %
Platelets: 205 10*3/uL (ref 150–400)
RBC: 5.08 MIL/uL (ref 4.22–5.81)
RDW: 13.3 % (ref 11.5–15.5)
WBC: 5.5 10*3/uL (ref 4.0–10.5)
nRBC: 0 % (ref 0.0–0.2)

## 2023-01-24 LAB — URINALYSIS, ROUTINE W REFLEX MICROSCOPIC
Bacteria, UA: NONE SEEN
Bilirubin Urine: NEGATIVE
Glucose, UA: NEGATIVE mg/dL
Ketones, ur: NEGATIVE mg/dL
Leukocytes,Ua: NEGATIVE
Nitrite: NEGATIVE
Protein, ur: 30 mg/dL — AB
Specific Gravity, Urine: 1.018 (ref 1.005–1.030)
pH: 5 (ref 5.0–8.0)

## 2023-01-24 LAB — POCT INFLUENZA A/B
Influenza A, POC: NEGATIVE
Influenza B, POC: NEGATIVE

## 2023-01-24 LAB — C-REACTIVE PROTEIN: CRP: 5.6 mg/dL — ABNORMAL HIGH (ref <1.0)

## 2023-01-24 LAB — D-DIMER, QUANTITATIVE: D-Dimer, Quant: 0.36 ug/mL-FEU (ref 0.00–0.50)

## 2023-01-24 LAB — LACTIC ACID, PLASMA: Lactic Acid, Venous: 1.6 mmol/L (ref 0.5–1.9)

## 2023-01-24 LAB — POC COVID19 BINAXNOW: SARS Coronavirus 2 Ag: POSITIVE — AB

## 2023-01-24 MED ORDER — AMLODIPINE BESYLATE 5 MG PO TABS
2.5000 mg | ORAL_TABLET | Freq: Every day | ORAL | Status: DC
  Administered 2023-01-24 – 2023-01-25 (×2): 2.5 mg via ORAL
  Filled 2023-01-24 (×2): qty 1

## 2023-01-24 MED ORDER — ACETAMINOPHEN 500 MG PO TABS
1000.0000 mg | ORAL_TABLET | Freq: Once | ORAL | Status: AC
  Administered 2023-01-24: 1000 mg via ORAL
  Filled 2023-01-24: qty 2

## 2023-01-24 MED ORDER — ZINC SULFATE 220 (50 ZN) MG PO CAPS
220.0000 mg | ORAL_CAPSULE | Freq: Every day | ORAL | Status: DC
  Administered 2023-01-24 – 2023-01-25 (×2): 220 mg via ORAL
  Filled 2023-01-24 (×2): qty 1

## 2023-01-24 MED ORDER — ACETAMINOPHEN 500 MG PO TABS
1000.0000 mg | ORAL_TABLET | Freq: Four times a day (QID) | ORAL | Status: DC | PRN
  Administered 2023-01-24: 1000 mg via ORAL
  Filled 2023-01-24: qty 2

## 2023-01-24 MED ORDER — ACETAMINOPHEN 650 MG RE SUPP
650.0000 mg | Freq: Four times a day (QID) | RECTAL | Status: DC | PRN
  Filled 2023-01-24: qty 1

## 2023-01-24 MED ORDER — NIRMATRELVIR/RITONAVIR (PAXLOVID) TABLET (RENAL DOSING)
2.0000 | ORAL_TABLET | Freq: Two times a day (BID) | ORAL | Status: DC
  Administered 2023-01-24 – 2023-01-25 (×2): 2 via ORAL
  Filled 2023-01-24: qty 20

## 2023-01-24 MED ORDER — VITAMIN C 500 MG PO TABS
500.0000 mg | ORAL_TABLET | Freq: Every day | ORAL | Status: DC
  Administered 2023-01-24 – 2023-01-25 (×2): 500 mg via ORAL
  Filled 2023-01-24 (×2): qty 1

## 2023-01-24 MED ORDER — SODIUM CHLORIDE 0.9 % IV BOLUS
1000.0000 mL | Freq: Once | INTRAVENOUS | Status: AC
  Administered 2023-01-24: 1000 mL via INTRAVENOUS

## 2023-01-24 MED ORDER — ASPIRIN 81 MG PO TBEC
81.0000 mg | DELAYED_RELEASE_TABLET | Freq: Every day | ORAL | Status: DC
  Administered 2023-01-25: 81 mg via ORAL
  Filled 2023-01-24: qty 1

## 2023-01-24 MED ORDER — FLUTICASONE FUROATE-VILANTEROL 200-25 MCG/ACT IN AEPB
1.0000 | INHALATION_SPRAY | Freq: Every day | RESPIRATORY_TRACT | Status: DC
  Administered 2023-01-25: 1 via RESPIRATORY_TRACT
  Filled 2023-01-24: qty 28

## 2023-01-24 MED ORDER — ACETAMINOPHEN 650 MG RE SUPP: 650.0000 mg | Freq: Four times a day (QID) | RECTAL | Status: DC | PRN

## 2023-01-24 MED ORDER — ACETAMINOPHEN 325 MG PO TABS
650.0000 mg | ORAL_TABLET | Freq: Four times a day (QID) | ORAL | Status: DC | PRN
  Administered 2023-01-24: 650 mg via ORAL
  Filled 2023-01-24: qty 2

## 2023-01-24 MED ORDER — ONDANSETRON HCL 4 MG PO TABS: 4.0000 mg | ORAL_TABLET | Freq: Four times a day (QID) | ORAL | Status: DC | PRN

## 2023-01-24 MED ORDER — ONDANSETRON HCL 4 MG/2ML IJ SOLN: 4.0000 mg | Freq: Four times a day (QID) | INTRAMUSCULAR | Status: DC | PRN

## 2023-01-24 NOTE — ED Provider Notes (Signed)
[facility] Provider Note   CSN: [id number] Arrival date & time: 01/24/23  1202     History  Chief Complaint  Patient presents with   Generalized Body Aches    Willie Parks is a 77 y.o. male.  77 yo M with a chief complaints of fatigue cough shortness of breath.  Has been having a cough for many weeks now.  End up seeing his family doctor in the office about a week or so ago.  Was started on cough medicine and went back for recheck today.  Was found to have COVID and was very ill unable to sit up and so they sent him here via EMS for admission.  Family feels like he is able to walk to the car on the way there.  Has been having chills and rigors.        Home Medications Prior to Admission medications   Medication Sig Start Date End Date Taking? Authorizing Provider  amLODipine (NORVASC) 2.5 MG tablet Take 1 tablet (2.5 mg total) by mouth daily. 01/06/23   Martinique, Betty G, MD  ASPIRIN 81 PO Take by mouth daily.    [provider]  atorvastatin (LIPITOR) 20 MG tablet TAKE 1 TABLET BY MOUTH EVERY OTHER DAY 08/19/22 08/19/23  Martinique, Betty G, MD  Calcium Carbonate-Vitamin D (CALCARB 600/D PO) Take by mouth daily.    [provider]  dextromethorphan-guaiFENesin (MUCINEX DM) 30-600 MG 12hr tablet Take 1 tablet by mouth 2 (two) times daily. 01/15/23   Farrel Conners, MD  fluticasone-salmeterol (ADVAIR DISKUS) 250-50 MCG/ACT AEPB Inhale 1 puff into the lungs twice daily 06/26/22   Martinique, Betty G, MD  loratadine (CLARITIN) 10 MG tablet Take 1 tablet (10 mg total) by mouth daily. 01/15/23   Farrel Conners, MD      Allergies    Penicillins    Review of Systems   Review of Systems  Physical Exam Updated Vital Signs BP 137/72   Pulse 86   Temp (!) 100.7 F (38.2 C) (Oral)   Resp (!) 24   SpO2 93%  Physical Exam Vitals and nursing note reviewed.  Constitutional:      Appearance: He is well-developed.  HENT:      Head: Normocephalic and atraumatic.  Eyes:     Pupils: Pupils are equal, round, and reactive to light.  Neck:     Vascular: No JVD.  Cardiovascular:     Rate and Rhythm: Normal rate and regular rhythm.     Heart sounds: No murmur heard.    No friction rub. No gallop.  Pulmonary:     Effort: No respiratory distress.     Breath sounds: No wheezing.  Abdominal:     General: There is no distension.     Tenderness: There is no abdominal tenderness. There is no guarding or rebound.  Musculoskeletal:        General: Normal range of motion.     Cervical back: Normal range of motion and neck supple.  Skin:    Coloration: Skin is not pale.     Findings: No rash.  Neurological:     Mental Status: He is alert and oriented to person, place, and time.  Psychiatric:        Behavior: Behavior normal.     ED Results / Procedures / Treatments   Labs (all labs ordered are listed, but only abnormal results are displayed) Labs Reviewed  CBC WITH DIFFERENTIAL/PLATELET - Abnormal;  Notable for the following components:      Result Value   Lymphs Abs 0.5 (*)    All other components within normal limits  COMPREHENSIVE METABOLIC PANEL - Abnormal; Notable for the following components:   Sodium 134 (*)    Glucose, Bld 101 (*)    Creatinine, Ser 1.41 (*)    GFR, Estimated 51 (*)    All other components within normal limits  URINALYSIS, ROUTINE W REFLEX MICROSCOPIC - Abnormal; Notable for the following components:   Hgb urine dipstick MODERATE (*)    Protein, ur 30 (*)    All other components within normal limits  CULTURE, BLOOD (ROUTINE X 2)  CULTURE, BLOOD (ROUTINE X 2)  LACTIC ACID, PLASMA  LACTIC ACID, PLASMA    EKG EKG Interpretation  Date/Time:  Friday January 24 2023 12:13:39 EST Ventricular Rate:  89 PR Interval:  214 QRS Duration: 93 QT Interval:  369 QTC Calculation: 449 R Axis:   35 Text Interpretation: Sinus rhythm Borderline prolonged PR interval Probable left atrial  enlargement Borderline T wave abnormalities Baseline wander T wave amplitude has decreased in Otherwise no significant change Confirmed by Deno Etienne [phone]) on 01/24/2023 12:28:19 PM  Radiology DG Chest Port 1 View  Result Date: 01/24/2023 CLINICAL DATA:  Cough and fatigue EXAM: PORTABLE CHEST 1 VIEW COMPARISON:  None Available. FINDINGS: Exam is lordotic. Normal mediastinum and cardiac silhouette. Normal pulmonary vasculature. No evidence of effusion, infiltrate, or pneumothorax. No acute bony abnormality. IMPRESSION: No acute cardiopulmonary process. Electronically Signed   By: Suzy Bouchard M.D.   On: 01/24/2023 13:09    Procedures Procedures    Medications Ordered in ED Medications  sodium chloride 0.9 % bolus 1,000 mL (1,000 mLs Intravenous New Bag/Given 01/24/23 1316)  acetaminophen (TYLENOL) tablet 1,000 mg (1,000 mg Oral Given 01/24/23 1317)    ED Course/ Medical Decision Making/ A&P                             Medical Decision Making Amount and/or Complexity of Data Reviewed Labs: ordered. Radiology: ordered.  Risk OTC drugs. Decision regarding hospitalization.   77 yo M with a chief complaints of generalized fatigue.  Covid + to weak to ambulate. No significant change to renal function.  CXR independently turbid by me without focal infiltrate pneumothorax.  Discussed with medicine for admission.  The patients results and plan were reviewed and discussed.   Any x-rays performed were independently reviewed by myself.   Differential diagnosis were considered with the presenting HPI.  Medications  sodium chloride 0.9 % bolus 1,000 mL (1,000 mLs Intravenous New Bag/Given 01/24/23 1316)  acetaminophen (TYLENOL) tablet 1,000 mg (1,000 mg Oral Given 01/24/23 1317)    Vitals:   01/24/23 1330 01/24/23 1430 01/24/23 1448 01/24/23 1500  BP: (!) 155/120 126/69  137/72  Pulse: 99 89  86  Resp: (!) 27 20  (!) 24  Temp:   (!) 100.7 F (38.2 C)   TempSrc:   Oral   SpO2: 94% 94%   93%    Final diagnoses:  COVID-19 virus infection    Admission/ observation were discussed with the admitting physician, patient and/or family and they are comfortable with the plan.          Final Clinical Impression(s) / ED Diagnoses Final diagnoses:  COVID-19 virus infection    Rx / DC Orders ED Discharge Orders     None  Deno Etienne, DO 01/24/23 1601

## 2023-01-24 NOTE — ED Notes (Signed)
Pt's visitor asked for this tech to stop blood draw attempt due to pt being in pain.

## 2023-01-24 NOTE — Patient Instructions (Addendum)
A few things to remember from today's visit:  Fever, unspecified fever cause - Plan: POC COVID-19, POCT Influenza A/B  COVID-19 virus infection - Plan: POC COVID-19, POCT Influenza A/B  Persistent cough - Plan: POC COVID-19, POCT Influenza A/B  Irregular heart rate - Plan: EKG 12-Lead

## 2023-01-24 NOTE — ED Triage Notes (Signed)
Arrives via EMS from his PCP for [facility], fever, cough, generalized weakness, dx with COVID today. All vitals stable Cbg 170 18g right forearm

## 2023-01-24 NOTE — Assessment & Plan Note (Signed)
Hemoglobin A1c has been at goal. His wife wanted this to be checked today but given his acute illness, going to the ER, we can hold until next visit to reepat HgA1C.

## 2023-01-24 NOTE — H&P (Signed)
History and Physical    Patient: Willie Parks PJA:[id number] DOB: 09-Feb-1946 DOA: 01/24/2023 DOS: the patient was seen and examined on 01/24/2023 PCP: Martinique, Betty G, MD  Patient coming from: Home  Chief Complaint:  Chief Complaint  Patient presents with   Generalized Body Aches   HPI: Willie Parks is a 77 y.o. male with medical history significant of prostate cancer, diabetes mellitus, hyperlipidemia, asthma, CKD stage IIIa.  Patient presented secondary to a few days of feeling badly.  Patient reports associated fever starting yesterday.  He reports not taking any medication to help with his symptoms.  Reports associated cough without sputum production.  No nausea, vomiting, diarrhea, constipation, chills.  Sick contacts include granddaughter from a week prior.  Patient saw treatment at his primary care physician's office this morning and was recommended for evaluation in the emergency department to ensure no pneumonia from COVID-19 infection.  There is also concern for weakness, however patient states he is not feeling weak at this time.  He reports that he is eating and drinking well.  Review of Systems: As mentioned in the history of present illness. All other systems reviewed and are negative. Past Medical History:  Diagnosis Date   Allergy    Asthma    Chicken pox    Hyperlipidemia    Prostate cancer [facility])    Past Surgical History:  Procedure Laterality Date   KNEE ARTHROSCOPY Right    WRIST SURGERY Right    Social History:  reports that he has never smoked. He has never used smokeless tobacco. He reports that he does not currently use alcohol after a past usage of about 1.0 standard drink of alcohol per week. He reports that he does not use drugs.  Allergies  Allergen Reactions   Penicillins Other (See Comments)    Dizziness     Family History  Problem Relation Age of Onset   Cancer Neg Hx    Diabetes Neg Hx    Prostate cancer Neg Hx    Colon cancer Neg Hx    Breast  cancer Neg Hx     Prior to Admission medications   Medication Sig Start Date End Date Taking? Authorizing Provider  amLODipine (NORVASC) 2.5 MG tablet Take 1 tablet (2.5 mg total) by mouth daily. 01/06/23   Martinique, Betty G, MD  ASPIRIN 81 PO Take by mouth daily.    [provider]  atorvastatin (LIPITOR) 20 MG tablet TAKE 1 TABLET BY MOUTH EVERY OTHER DAY 08/19/22 08/19/23  Martinique, Betty G, MD  Calcium Carbonate-Vitamin D (CALCARB 600/D PO) Take by mouth daily.    [provider]  dextromethorphan-guaiFENesin (MUCINEX DM) 30-600 MG 12hr tablet Take 1 tablet by mouth 2 (two) times daily. 01/15/23   Farrel Conners, MD  fluticasone-salmeterol (ADVAIR DISKUS) 250-50 MCG/ACT AEPB Inhale 1 puff into the lungs twice daily 06/26/22   Martinique, Betty G, MD  loratadine (CLARITIN) 10 MG tablet Take 1 tablet (10 mg total) by mouth daily. 01/15/23   Farrel Conners, MD    Physical Exam: Vitals:   01/24/23 1212 01/24/23 1330 01/24/23 1430 01/24/23 1448  BP: (!) 169/73 (!) 155/120 126/69   Pulse: 88 99 89   Resp: 20 (!) 27 20   Temp: 98.5 F (36.9 C)   (!) 100.7 F (38.2 C)  TempSrc: Oral   Oral  SpO2: 96% 94% 94%    General exam: Appears calm and comfortable and in no acute distress. Conversant Respiratory: Clear to auscultation. Respiratory effort  normal with no intercostal retractions or use of accessory muscles Cardiovascular: S1 & S2 heard, RRR. No murmurs, rubs, gallops or clicks. No edema Gastrointestinal: Abdomen is distended, soft and non-tender. No masses felt. Normal bowel sounds heard Neurologic: No focal neurological deficits Musculoskeletal: No calf tenderness Skin: No cyanosis. No new rashes Psychiatry: Alert and oriented x3. Mood & affect appropriate  Data Reviewed:  There are no new results to review at this time.  Assessment and Plan:  COVID-19 infection Uncomplicated. Patient is without hypoxia or infiltrates on chest x-ray. Associated fever without  leukocytosis; lymphocytopenia present. Patient also with weakness. -Start Paxlovid (renally dosed) x5 days -Add-on CRP -PT/OT evaluation  CKD stage IIIa Creatinine is stable.  Primary hypertension -Continue amlodipine  Hyperlipidemia -Hold Lipitor while on Paxlovid  Vitamin D deficiency Noted. Patient is on calcium/vitamin D as an outpatient.  Gross hematuria Mild. Patient's wife noted some blood in patient's Depends. Urinalysis consistent with hematuria. Patient with history of prostate cancer and BPH. Renal function stable. No RBC casts noted on microscopy.  Prostate cancer BPH Patient is s/p external beam therapy in 2018. Previously on tamsulosin which he is not currently taking.  Diabetes mellitus type 2 Patient's last hemoglobin A1C of 6.8%. Patient managed with diet control. -Daily CBG  Asthma -Continue Advair Ruthe Mannan while inpatient)    Advance Care Planning:   Code Status: Full Code  Consults: None  Family Communication: Wife outside the room   Author: Cordelia Poche, MD 01/24/2023 3:07 PM  For on call review [email or web address].

## 2023-01-25 DIAGNOSIS — U071 COVID-19: Secondary | ICD-10-CM | POA: Diagnosis not present

## 2023-01-25 LAB — URINALYSIS, COMPLETE (UACMP) WITH MICROSCOPIC
Bacteria, UA: NONE SEEN
Bilirubin Urine: NEGATIVE
Glucose, UA: NEGATIVE mg/dL
Ketones, ur: 5 mg/dL — AB
Leukocytes,Ua: NEGATIVE
Nitrite: NEGATIVE
Protein, ur: NEGATIVE mg/dL
Specific Gravity, Urine: 1.013 (ref 1.005–1.030)
pH: 5 (ref 5.0–8.0)

## 2023-01-25 LAB — GLUCOSE, CAPILLARY: Glucose-Capillary: 90 mg/dL (ref 70–99)

## 2023-01-25 MED ORDER — ATORVASTATIN CALCIUM 20 MG PO TABS: ORAL_TABLET | ORAL | Status: DC

## 2023-01-25 MED ORDER — ASCORBIC ACID 500 MG PO TABS: 500.0000 mg | ORAL_TABLET | Freq: Every day | ORAL | Status: AC

## 2023-01-25 MED ORDER — FLUTICASONE-SALMETEROL 250-50 MCG/ACT IN AEPB: INHALATION_SPRAY | RESPIRATORY_TRACT | Status: DC

## 2023-01-25 MED ORDER — ZINC SULFATE 220 (50 ZN) MG PO CAPS: 220.0000 mg | ORAL_CAPSULE | Freq: Every day | ORAL | Status: DC

## 2023-01-25 MED ORDER — NIRMATRELVIR/RITONAVIR (PAXLOVID) TABLET (RENAL DOSING): 2.0000 | ORAL_TABLET | Freq: Two times a day (BID) | ORAL | 0 refills | Status: DC

## 2023-01-25 NOTE — Discharge Instructions (Signed)
Willie Parks,  You were in the hospital with COVID-19. This is mild, thankfully. Please follow-up with your PCP. Please hold on taking your Advair and Lipitor while on Paxlovid. Please return if you have worsening trouble breathing.

## 2023-01-25 NOTE — Hospital Course (Signed)
Willie Parks is a 77 y.o. male with a history of prostate cancer, diabetes mellitus, hyperlipidemia, asthma, CKD stage IIIa. Patient presented secondary to feeling badly with recent diagnosis of COVID-19 infection. Paxlovid stated. PT recommending no follow-up.

## 2023-01-25 NOTE — Plan of Care (Signed)

## 2023-01-25 NOTE — Evaluation (Signed)
Occupational Therapy Evaluation Patient Details Name: Willie Parks MRN: [id number] DOB: [birth year] Today's Date: 01/25/2023   History of Present Illness Patient is a 77 year old male who presented on 2/2 with a few day history of felling ill. Patient was admitted with COVID 19, PMH: prostate cancer, asthma, R wrist surgery, R knee arthroscopy.   Clinical Impression   Patient is a 77 year old male who was admitted for above. Patient lives at home with wife with independence in ADLs. Currently, patient is min guard for standing ADLs with posterior leaning noted. Patient is also noted to have tremors in BUE with intentional movement and minimally at rest. Patient and wife were educated on weights to calm tremors to better be able to participate in functional tasks. Patient would continue to benefit from skilled OT services at this time while admitted and after d/c to address noted deficits in order to improve overall safety and independence in ADLs.        Recommendations for follow up therapy are one component of a multi-disciplinary discharge planning process, led by the attending physician.  Recommendations may be updated based on patient status, additional functional criteria and insurance authorization.   Follow Up Recommendations  Home health OT     Assistance Recommended at Discharge Frequent or constant Supervision/Assistance  Patient can return home with the following A little help with walking and/or transfers;Assistance with cooking/housework;Direct supervision/assist for medications management;Assist for transportation;A little help with bathing/dressing/bathroom;Direct supervision/assist for financial management;Help with stairs or ramp for entrance    Functional Status Assessment  Patient has had a recent decline in their functional status and demonstrates the ability to make significant improvements in function in a reasonable and predictable amount of time.  Equipment  Recommendations  None recommended by OT    Recommendations for Other Services       Precautions / Restrictions Precautions Precautions: Fall Restrictions Weight Bearing Restrictions: No      Mobility Bed Mobility               General bed mobility comments: patient was up in recliner and returned to the same.             ADL either performed or assessed with clinical judgement   ADL Overall ADL's : Needs assistance/impaired Eating/Feeding: Supervision/ safety Eating/Feeding Details (indicate cue type and reason): tremor like movements in BUE. patient and wife were educated on weighted utensils if it keeps up. patient and wife verbalized understanding.         Lower Body Bathing: Set up;Sit to/from stand;Sitting/lateral leans;Min guard Lower Body Bathing Details (indicate cue type and reason): simulated task     Lower Body Dressing: Min guard;Set up Lower Body Dressing Details (indicate cue type and reason): noted to have put on sbneaker with pen in shoe when patient dressed himself. was able to get out of shoeseated EOB. Toilet Transfer: Magazine features editor Details (indicate cue type and reason): noted to be unsteady without LOB with CGA at times to maintain standing with noted toes off the floor. wife educated on cuing pt  to keep toes on the floor.wife verbalize understanding. Toileting- Water quality scientist and Hygiene: Min guard;Sit to/from stand;Sitting/lateral lean Toileting - Clothing Manipulation Details (indicate cue type and reason): posterior leaning during standing especailly while voiding bladder in standing. patient noted to have poor completion of task with patient leaving urine on floor, sink still running and not attempting to flush the commode. patient requred cues to stop leaning posteriorly  during whole task.             Vision Baseline Vision/History: 1 Wears glasses Vision Assessment?: No apparent visual deficits             Pertinent Vitals/Pain Pain Assessment Pain Assessment: No/denies pain     Hand Dominance Right   Extremity/Trunk Assessment Upper Extremity Assessment Upper Extremity Assessment: LUE deficits/detail;RUE deficits/detail RUE Deficits / Details: ROM WFL RUE Coordination: decreased fine motor LUE Deficits / Details: multiple attempts to complete buttoning jeans and zipper when standing. patient noted to have shakiness in BUE with reponse to groundign techniques to calm tremors. mostly visible with active movement of BUE. ROM WFL LUE Coordination: decreased fine motor   Lower Extremity Assessment Lower Extremity Assessment: Overall WFL for tasks assessed   Cervical / Trunk Assessment Cervical / Trunk Assessment: Kyphotic   Communication Communication Communication: No difficulties   Cognition Arousal/Alertness: Awake/alert Behavior During Therapy: WFL for tasks assessed/performed Overall Cognitive Status: Within Functional Limits for tasks assessed         General Comments: wife present in room as well.                Home Living Family/patient expects to be discharged to:: Private residence Living Arrangements: Spouse/significant other Available Help at Discharge: Family Type of Home: House       Home Layout: One [place]: Kasandra Knudsen - single point          Prior Functioning/Environment Prior Level of Function : Independent/Modified Independent                        OT Problem List: Decreased activity tolerance;Impaired balance (sitting and/or standing);Decreased coordination;Decreased knowledge of precautions;Decreased knowledge of use of DME or AE      OT Treatment/Interventions: Self-care/ADL training;Energy conservation;Therapeutic exercise;DME and/or AE instruction;Therapeutic activities;Patient/family education;Balance training    OT Goals(Current goals can be found in the care plan section) Acute Rehab OT  Goals Patient Stated Goal: to go home soon OT Goal Formulation: With patient/family Time For Goal Achievement: 02/08/23 Potential to Achieve Goals: Fair  OT Frequency: Min 2X/week       AM-PAC OT "6 Clicks" Daily Activity     Outcome Measure Help from another person eating meals?: A Little Help from another person taking care of personal grooming?: A Little Help from another person toileting, which includes using toliet, bedpan, or urinal?: A Little Help from another person bathing (including washing, rinsing, drying)?: A Little Help from another person to put on and taking off regular upper body clothing?: A Little Help from another person to put on and taking off regular lower body clothing?: A Little 6 Click Score: 18   End of Session Nurse Communication: Other (comment) (patients desire to go home)  Activity Tolerance: Patient tolerated treatment well Patient left: in chair;with call bell/phone within reach;with family/visitor present  OT Visit Diagnosis: Unsteadiness on feet (R26.81);Other abnormalities of gait and mobility (R26.89);Muscle weakness (generalized) (M62.81)                Time: 0388-8280 OT Time Calculation (min): 11 min Charges:  OT General Charges $OT Visit: 1 Visit OT Evaluation $OT Eval Low Complexity: 1 Low  Keiko Myricks OTR/L, MS Acute Rehabilitation Department Office# [phone]   Willa Rough 01/25/2023, 2:33 PM

## 2023-01-25 NOTE — Progress Notes (Signed)
Called report to [facility].   

## 2023-01-25 NOTE — Discharge Summary (Signed)
Physician Discharge Summary   Patient: Willie Parks MRN: [id number] DOB: [birth year]  Admit date:     01/24/2023  Discharge date: 01/25/23  Discharge Physician: Cordelia Poche, MD   PCP: Martinique, Betty G, MD   Recommendations at discharge:  PCP follow-up  Discharge Diagnoses: Principal Problem:   COVID-19 virus infection Active Problems:   Essential hypertension   Mild intermittent asthma, uncomplicated   Vitamin D deficiency   BPH associated with nocturia   Hyperlipidemia   Diabetes mellitus ([place])   Stage 3a chronic kidney disease ([place])   Gross hematuria  Resolved Problems:   * No resolved hospital problems. *  Hospital Course: Willie Parks is a 77 y.o. male with a history of prostate cancer, diabetes mellitus, hyperlipidemia, asthma, CKD stage IIIa. Patient presented secondary to feeling badly with recent diagnosis of COVID-19 infection. Paxlovid stated. PT recommending no follow-up.  Assessment and Plan:  COVID-19 infection Uncomplicated. Patient is without hypoxia or infiltrates on chest x-ray. Associated fever without leukocytosis; lymphocytopenia present. Patient also with weakness. Paxlovid (renally dosed) started with recommendation for 5 day course.   CKD stage IIIa Creatinine is stable.   Primary hypertension Continue amlodipine   Hyperlipidemia Hold Lipitor while on Paxlovid   Vitamin D deficiency Noted. Patient is on calcium/vitamin D as an outpatient.   Gross hematuria Mild. Patient's wife noted some blood in patient's Depends. Urinalysis consistent with hematuria. Patient with history of prostate cancer and BPH. Renal function stable. No RBC casts noted on microscopy. Repeat urinalysis without RBCs on microscopy. Appears to be transient and likely requires no follow-up.   Prostate cancer BPH Patient is s/p external beam therapy in 2018. Previously on tamsulosin which he is not currently taking.   Diabetes mellitus type 2 Patient's last hemoglobin A1C  of 6.8%. Patient managed with diet control.   Asthma Hold Advair while on Paxlovid.  Procedures performed: None  Disposition: Home Diet recommendation: Carb modified   DISCHARGE MEDICATION: Allergies as of 01/25/2023       Reactions   Penicillins Other (See Comments)   Dizziness        Medication List     TAKE these medications    amLODipine 2.5 MG tablet Commonly known as: NORVASC Take 1 tablet (2.5 mg total) by mouth daily.   ascorbic acid 500 MG tablet Commonly known as: VITAMIN C Take 1 tablet (500 mg total) by mouth daily. Start taking on: January 26, 2023   aspirin EC 81 MG tablet Take 81 mg by mouth daily. Swallow whole.   atorvastatin 20 MG tablet Commonly known as: LIPITOR TAKE 1 TABLET BY MOUTH EVERY OTHER DAY Start taking on: January 30, 2023 What changed: These instructions start on January 30, 2023. If you are unsure what to do until then, ask your doctor or other care provider.   CALCIUM + D3 PO Take 1 tablet by mouth daily.   dextromethorphan-guaiFENesin 30-600 MG 12hr tablet Commonly known as: MUCINEX DM Take 1 tablet by mouth 2 (two) times daily.   fluticasone-salmeterol 250-50 MCG/ACT Aepb Commonly known as: Advair Diskus Inhale 1 puff into the lungs twice daily Start taking on: January 30, 2023 What changed: These instructions start on January 30, 2023. If you are unsure what to do until then, ask your doctor or other care provider.   loratadine 10 MG tablet Commonly known as: CLARITIN Take 1 tablet (10 mg total) by mouth daily.   nirmatrelvir/ritonavir (renal dosing) 10 x 150 MG & 10 x '100MG'$  Tabs Commonly known  as: PAXLOVID Take 2 tablets by mouth 2 (two) times daily for 5 days.   TYLENOL 500 MG tablet Generic drug: acetaminophen Take 500-1,000 mg by mouth every 6 (six) hours as needed for fever, mild pain or headache.   zinc sulfate 220 (50 Zn) MG capsule Take 1 capsule (220 mg total) by mouth daily. Start taking on: January 26, 2023        Follow-up Information     Martinique, Betty G, MD. Schedule an appointment as soon as possible for a visit in 1 week(s).   Specialty: Family Medicine Why: For hospital follow-up Contact information: [place] [address] [phone]                Discharge Exam: BP (!) 159/78   Pulse 82   Temp 99.9 F (37.7 C)   Resp 17   Ht '6\' 2"'$  (1.88 m)   Wt 82.6 kg   SpO2 100%   BMI 23.37 kg/m   General exam: Appears calm and comfortable Respiratory system: Clear to auscultation. Respiratory effort normal. Cardiovascular system: S1 & S2 heard, RRR. Gastrointestinal system: Abdomen is nondistended, soft and nontender. No organomegaly or masses felt. Normal bowel sounds heard. Central nervous system: Alert and oriented. No focal neurological deficits. Musculoskeletal: No edema. No calf tenderness Skin: No cyanosis. No rashes Psychiatry: Judgement and insight appear normal. Mood & affect appropriate.   Condition at discharge: stable  The results of significant diagnostics from this hospitalization (including imaging, microbiology, ancillary and laboratory) are listed below for reference.   Imaging Studies: DG Chest Port 1 View  Result Date: 01/24/2023 CLINICAL DATA:  Cough and fatigue EXAM: PORTABLE CHEST 1 VIEW COMPARISON:  None Available. FINDINGS: Exam is lordotic. Normal mediastinum and cardiac silhouette. Normal pulmonary vasculature. No evidence of effusion, infiltrate, or pneumothorax. No acute bony abnormality. IMPRESSION: No acute cardiopulmonary process. Electronically Signed   By: Suzy Bouchard M.D.   On: 01/24/2023 13:09    Microbiology: Results for orders placed or performed during the hospital encounter of 01/24/23  Blood culture (routine x 2)     Status: None (Preliminary result)   Collection Time: 01/24/23  1:13 PM   Specimen: BLOOD  Result Value Ref Range Status   Specimen Description   Final    BLOOD RIGHT  ANTECUBITAL Performed at [facility] [address], [place], [place] [address]    Special Requests   Final    BOTTLES DRAWN AEROBIC AND ANAEROBIC Blood Culture adequate volume Performed at [facility] [address] [place], [place] [address]    Culture   Final    NO GROWTH < 24 HOURS Performed at [facility] [address] [place], [place] [address]    Report Status PENDING  Incomplete  Blood culture (routine x 2)     Status: None (Preliminary result)   Collection Time: 01/24/23  2:47 PM   Specimen: BLOOD LEFT HAND  Result Value Ref Range Status   Specimen Description   Final    BLOOD LEFT HAND Performed at [facility], [place] [address], [place], [place] [address]    Special Requests   Final    BOTTLES DRAWN AEROBIC AND ANAEROBIC Blood Culture adequate volume Performed at [facility] [address] [place], [place] [address]    Culture   Final    NO GROWTH < 24 HOURS Performed at [facility] [place],  [place] [address]    Report Status PENDING  Incomplete    Labs: CBC: Recent Labs  Lab 01/24/23 1322  WBC 5.5  NEUTROABS 4.4  HGB 14.4  HCT 46.0  MCV 90.6  PLT 585   Basic Metabolic Panel: Recent Labs  Lab 01/24/23 1322  NA 134*  K 4.3  CL 99  CO2 25  GLUCOSE 101*  BUN 10  CREATININE 1.41*  CALCIUM 9.5   Liver Function Tests: Recent Labs  Lab 01/24/23 1322  AST 31  ALT 31  ALKPHOS 77  BILITOT 1.2  PROT 7.5  ALBUMIN 4.4   CBG: Recent Labs  Lab 01/25/23 0511  GLUCAP 90    Discharge time spent: 35 minutes.  Signed: Cordelia Poche, MD [facility] 01/25/2023

## 2023-01-25 NOTE — Plan of Care (Signed)
Febrile episode, prn tylenol given. VS monitored.    Problem: Education: Goal: Knowledge of risk factors and measures for prevention of condition will improve Outcome: Progressing   Problem: Coping: Goal: Psychosocial and spiritual needs will be supported Outcome: Progressing   Problem: Respiratory: Goal: Will maintain a patent airway Outcome: Progressing Goal: Complications related to the disease process, condition or treatment will be avoided or minimized Outcome: Progressing   Problem: Education: Goal: Knowledge of General Education information will improve Description: Including pain rating scale, medication(s)/side effects and non-pharmacologic comfort measures Outcome: Progressing   Problem: Health Behavior/Discharge Planning: Goal: Ability to manage health-related needs will improve Outcome: Progressing   Problem: Clinical Measurements: Goal: Ability to maintain clinical measurements within normal limits will improve Outcome: Progressing Goal: Will remain free from infection Outcome: Progressing Goal: Diagnostic test results will improve Outcome: Progressing Goal: Respiratory complications will improve Outcome: Progressing Goal: Cardiovascular complication will be avoided Outcome: Progressing   Problem: Activity: Goal: Risk for activity intolerance will decrease Outcome: Progressing   Problem: Nutrition: Goal: Adequate nutrition will be maintained Outcome: Progressing   Problem: Coping: Goal: Level of anxiety will decrease Outcome: Progressing   Problem: Elimination: Goal: Will not experience complications related to bowel motility Outcome: Progressing Goal: Will not experience complications related to urinary retention Outcome: Progressing   Problem: Pain Managment: Goal: General experience of comfort will improve Outcome: Progressing   Problem: Safety: Goal: Ability to remain free from injury will improve Outcome: Progressing   Problem: Skin  Integrity: Goal: Risk for impaired skin integrity will decrease Outcome: Progressing

## 2023-01-25 NOTE — Evaluation (Signed)
Physical Therapy Evaluation Patient Details Name: Willie Parks MRN: [id number] DOB: 1946-01-14 Today's Date: 01/25/2023  History of Present Illness  77 y.o. male adm with c/o weakness, Covid. medical history significant of prostate cancer, diabetes mellitus, hyperlipidemia, asthma, CKD stage IIIa.  Clinical Impression  Pt admitted with above diagnosis.  Pt reports independence at his baseline; agreeable to PT and able to amb ~80' without device, slightly unsteady gait but without overt LOB; SPO2=93-96% on RA during PT session. Do not anticipate pt will need f/u  Pt currently with functional limitations due to the deficits listed below (see PT Problem List). Pt will benefit from skilled PT to increase their independence and safety with mobility to allow discharge to the venue listed below.          Recommendations for follow up therapy are one component of a multi-disciplinary discharge planning process, led by the attending physician.  Recommendations may be updated based on patient status, additional functional criteria and insurance authorization.  Follow Up Recommendations No PT follow up      Assistance Recommended at Discharge PRN  Patient can return home with the following  Help with stairs or ramp for entrance    Equipment Recommendations None recommended by PT  Recommendations for Other Services       Functional Status Assessment Patient has had a recent decline in their functional status and demonstrates the ability to make significant improvements in function in a reasonable and predictable amount of time.     Precautions / Restrictions Precautions Precautions: Fall Restrictions Weight Bearing Restrictions: No      Mobility  Bed Mobility Overal bed mobility: Needs Assistance Bed Mobility: Supine to Sit     Supine to sit: Supervision     General bed mobility comments: for safety only, no physical assist    Transfers Overall transfer level: Needs  assistance Equipment used: None Transfers: Sit to/from Stand Sit to Stand: Supervision           General transfer comment: for safety    Ambulation/Gait Ambulation/Gait assistance: Min guard Gait Distance (Feet): 80 Feet Assistive device: None Gait Pattern/deviations: Step-through pattern, Decreased stride length, Trunk flexed       General Gait Details: min/guard for safety, unsteady gait, no overt LOB  Stairs            Wheelchair Mobility    Modified Rankin (Stroke Patients Only)       Balance Overall balance assessment: Needs assistance Sitting-balance support: No upper extremity supported, Feet supported Sitting balance-Leahy Scale: Good     Standing balance support: During functional activity Standing balance-Leahy Scale: Fair Standing balance comment: fair+                             Pertinent Vitals/Pain Pain Assessment Pain Assessment: No/denies pain    Home Living Family/patient expects to be discharged to:: Private residence Living Arrangements: Spouse/significant other Available Help at Discharge: Family Type of Home: House         Home Layout: One level Home Equipment: Kasandra Knudsen - single point      Prior Function Prior Level of Function : Independent/Modified Independent                     Hand Dominance        Extremity/Trunk Assessment   Upper Extremity Assessment Upper Extremity Assessment: Defer to OT evaluation    Lower Extremity Assessment Lower Extremity Assessment: Overall  WFL for tasks assessed       Communication   Communication: No difficulties  Cognition Arousal/Alertness: Awake/alert Behavior During Therapy: WFL for tasks assessed/performed Overall Cognitive Status: Within Functional Limits for tasks assessed                                          General Comments      Exercises     Assessment/Plan    PT Assessment Patient needs continued PT services  PT  Problem List Decreased mobility;Decreased activity tolerance;Cardiopulmonary status limiting activity       PT Treatment Interventions Therapeutic exercise;Gait training;Functional mobility training;Therapeutic activities;Patient/family education    PT Goals (Current goals can be found in the Care Plan section)  Acute Rehab PT Goals PT Goal Formulation: With patient Time For Goal Achievement: 02/08/23 Potential to Achieve Goals: Good    Frequency Min 3X/week     Co-evaluation               AM-PAC PT "6 Clicks" Mobility  Outcome Measure Help needed turning from your back to your side while in a flat bed without using bedrails?: None Help needed moving from lying on your back to sitting on the side of a flat bed without using bedrails?: None Help needed moving to and from a bed to a chair (including a wheelchair)?: None Help needed standing up from a chair using your arms (e.g., wheelchair or bedside chair)?: A Little Help needed to walk in hospital room?: A Little Help needed climbing 3-5 steps with a railing? : A Little 6 Click Score: 21    End of Session Equipment Utilized During Treatment: Gait belt Activity Tolerance: Patient tolerated treatment well Patient left: with call bell/phone within reach;in chair;with chair alarm set;with family/visitor present Nurse Communication: Mobility status PT Visit Diagnosis: Other abnormalities of gait and mobility (R26.89)    Time: 1031-1050 PT Time Calculation (min) (ACUTE ONLY): 19 min   Charges:   PT Evaluation $PT Eval Low Complexity: [place], PT  Acute Rehab Dept [facility]) [phone]  [facility] Weekend Pager (Saturday/Sunday only)  [phone]  01/25/2023   [facility] 01/25/2023, 11:33 AM

## 2023-01-25 NOTE — Progress Notes (Signed)
  Transition of Care [facility]) Screening Note   Patient Details  Name: Willie Parks Date of Birth: 05-31-1946   Transition of Care [facility]) CM/SW Contact:    Vassie Moselle, LCSW Phone Number: 01/25/2023, 11:57 AM    Transition of Care Department [facility]) has reviewed patient and no TOC needs have been identified at this time. We will continue to monitor patient advancement through interdisciplinary progression rounds. If new patient transition needs arise, please place a TOC consult.

## 2023-01-29 LAB — CULTURE, BLOOD (ROUTINE X 2)
Culture: NO GROWTH
Culture: NO GROWTH
Special Requests: ADEQUATE
Special Requests: ADEQUATE

## 2023-01-29 NOTE — Progress Notes (Unsigned)
HPI: [name] is a 77 y.o. male, who is here today with his wife to follow on recent hospitalization. He was admitted on 01/24/2023 and discharged after 24 hours, 01/25/2023. He was here in the office, diagnosed with COVID-19 infection, because of severe weakness, cardiac arrhythmia on examination, he was sent to the ED via EMS. He is reporting feeling much better. He completed treatment with Paxlovid, well-tolerated. No other medication changes.   Lab Results  Component Value Date   CREATININE 1.41 (H) 01/24/2023   BUN 10 01/24/2023   NA 134 (L) 01/24/2023   K 4.3 01/24/2023   CL 99 01/24/2023   CO2 25 01/24/2023   Diabetes Mellitus II: Dx'ed in 10/2021  He is not checking BS at home. He is on nonpharmacologic treatment. Negative for symptoms of hypoglycemia, polyuria, polydipsia, numbness extremities, foot ulcers/trauma  Left shoulder pain and limitation of range of motion. He has problem intermittently for***.  Review of Systems  Constitutional:  Positive for appetite change and fatigue.  Gastrointestinal:  Negative for abdominal pain, nausea and vomiting.  Musculoskeletal:  Positive for arthralgias.  Neurological:  Negative for syncope and facial asymmetry.  Psychiatric/Behavioral:  Negative for confusion. The patient is not nervous/anxious.   See other pertinent positives and negatives in HPI.  Current Outpatient Medications on File Prior to Visit  Medication Sig Dispense Refill   acetaminophen (TYLENOL) 500 MG tablet Take 500-1,000 mg by mouth every 6 (six) hours as needed for fever, mild pain or headache.     amLODipine (NORVASC) 2.5 MG tablet Take 1 tablet (2.5 mg total) by mouth daily. 30 tablet 1   ascorbic acid (VITAMIN C) 500 MG tablet Take 1 tablet (500 mg total) by mouth daily.     aspirin EC 81 MG tablet Take 81 mg by mouth daily. Swallow whole.     atorvastatin (LIPITOR) 20 MG tablet TAKE 1 TABLET BY MOUTH EVERY OTHER DAY     Calcium  Carb-Cholecalciferol (CALCIUM + D3 PO) Take 1 tablet by mouth daily.     fluticasone-salmeterol (ADVAIR DISKUS) 250-50 MCG/ACT AEPB Inhale 1 puff into the lungs twice daily     zinc sulfate 220 (50 Zn) MG capsule Take 1 capsule (220 mg total) by mouth daily.     dextromethorphan-guaiFENesin (MUCINEX DM) 30-600 MG 12hr tablet Take 1 tablet by mouth 2 (two) times daily. (Patient not taking: Reported on 01/24/2023) 30 tablet 0   loratadine (CLARITIN) 10 MG tablet Take 1 tablet (10 mg total) by mouth daily. (Patient not taking: Reported on 01/24/2023) 30 tablet 11   No current facility-administered medications on file prior to visit.    Past Medical History:  Diagnosis Date   Allergy    Asthma    Chicken pox    Hyperlipidemia    Prostate cancer ([place])    Allergies  Allergen Reactions   Penicillins Other (See Comments)    Dizziness     Social History   Socioeconomic History   Marital status: Married    Spouse name: Not on file   Number of children: Not on file   Years of education: Not on file   Highest education level: Not on file  Occupational History   Occupation: retired  Tobacco Use   Smoking status: Never   Smokeless tobacco: Never  Vaping Use   Vaping Use: Never used  Substance and Sexual Activity   Alcohol use: Not Currently    Alcohol/week: 1.0 standard drink of alcohol    Types: 1  Standard drinks or equivalent per week    Comment: per week   Drug use: No   Sexual activity: Not Currently  Other Topics Concern   Not on file  Social History Narrative   Second marriage. Resides with wife in [place]. Retired. Has one grown child. 15 grandchildren to include his step grandchildren.    10-08-18 Unable to ask abuse questions wife with him today.   Social Determinants of Health   Financial Resource Strain: Low Risk  (09/04/2022)   Overall Financial Resource Strain (CARDIA)    Difficulty of Paying Living Expenses: Not hard at all  Food Insecurity: No Food Insecurity  (01/24/2023)   Hunger Vital Sign    Worried About Running Out of Food in the Last Year: Never true    Ran Out of Food in the Last Year: Never true  Transportation Needs: No Transportation Needs (01/24/2023)   PRAPARE - Hydrologist (Medical): No    Lack of Transportation (Non-Medical): No  Physical Activity: Inactive (09/04/2022)   Exercise Vital Sign    Days of Exercise per Week: 0 days    Minutes of Exercise per Session: 0 min  Stress: No Stress Concern Present (09/04/2022)   [place]    Feeling of Stress : Not at all  Social Connections: [place] (09/04/2022)   Social Connection and Isolation Panel [NHANES]    Frequency of Communication with Friends and Family: More than three times a week    Frequency of Social Gatherings with Friends and Family: More than three times a week    Attends Religious Services: More than 4 times per year    Active Member of [employer] or Organizations: Yes    Attends Archivist Meetings: More than 4 times per year    Marital Status: Married    Vitals:   01/31/23 1203  BP: 128/70  Pulse: 70  SpO2: 97%   Body mass index is 23.11 kg/m.  Physical Exam Vitals and nursing note reviewed.  Constitutional:      General: He is not in acute distress.    Appearance: He is well-developed.  HENT:     Head: Normocephalic and atraumatic.  Eyes:     Conjunctiva/sclera: Conjunctivae normal.  Cardiovascular:     Rate and Rhythm: Normal rate and regular rhythm.     Pulses:          Posterior tibial pulses are 2+ on the right side and 2+ on the left side.     Heart sounds: No murmur heard. Pulmonary:     Effort: Pulmonary effort is normal. No respiratory distress.     Breath sounds: Normal breath sounds.  Abdominal:     Palpations: Abdomen is soft. There is no hepatomegaly or mass.     Tenderness: There is no abdominal tenderness.  Lymphadenopathy:      Cervical: No cervical adenopathy.  Skin:    General: Skin is warm.     Findings: No erythema or rash.  Neurological:     Mental Status: He is alert and oriented to person, place, and time.     Cranial Nerves: No cranial nerve deficit.     Gait: Gait normal.  Psychiatric:     Comments: Well groomed, good eye contact.     ASSESSMENT AND PLAN:  There are no diagnoses linked to this encounter.  No orders of the defined types were placed in this encounter.   No  problem-specific Assessment & Plan notes found for this encounter.   No follow-ups on file.  Tyheim Vanalstyne G. Martinique, MD  [facility]. [place] office.

## 2023-01-31 ENCOUNTER — Ambulatory Visit (INDEPENDENT_AMBULATORY_CARE_PROVIDER_SITE_OTHER): Payer: Medicare HMO | Admitting: Family Medicine

## 2023-01-31 ENCOUNTER — Encounter: Payer: Self-pay | Admitting: Family Medicine

## 2023-01-31 ENCOUNTER — Other Ambulatory Visit (HOSPITAL_COMMUNITY): Payer: Self-pay

## 2023-01-31 VITALS — BP 128/70 | HR 70 | Resp 16 | Ht 74.0 in | Wt 180.0 lb

## 2023-01-31 DIAGNOSIS — J4520 Mild intermittent asthma, uncomplicated: Secondary | ICD-10-CM

## 2023-01-31 DIAGNOSIS — M25512 Pain in left shoulder: Secondary | ICD-10-CM | POA: Diagnosis not present

## 2023-01-31 DIAGNOSIS — C61 Malignant neoplasm of prostate: Secondary | ICD-10-CM | POA: Diagnosis not present

## 2023-01-31 DIAGNOSIS — N1831 Chronic kidney disease, stage 3a: Secondary | ICD-10-CM | POA: Diagnosis not present

## 2023-01-31 DIAGNOSIS — I10 Essential (primary) hypertension: Secondary | ICD-10-CM

## 2023-01-31 DIAGNOSIS — E1169 Type 2 diabetes mellitus with other specified complication: Secondary | ICD-10-CM | POA: Diagnosis not present

## 2023-01-31 LAB — POCT GLYCOSYLATED HEMOGLOBIN (HGB A1C): HbA1c, POC (prediabetic range): 6.2 % (ref 5.7–6.4)

## 2023-01-31 MED ORDER — ALBUTEROL SULFATE HFA 108 (90 BASE) MCG/ACT IN AERS
2.0000 | INHALATION_SPRAY | Freq: Four times a day (QID) | RESPIRATORY_TRACT | 2 refills | Status: DC | PRN
  Filled 2023-01-31: qty 6.7, 25d supply, fill #0

## 2023-01-31 NOTE — Assessment & Plan Note (Signed)
Follows with urology regularly.

## 2023-01-31 NOTE — Assessment & Plan Note (Signed)
Problem is well-controlled. He has mild productive cough due to recent COVID-19 infection but no wheezing or dyspnea. Continue Advair 250-50 mcg twice daily and albuterol inhaler 1 to 2 puff every 4-6 hours as needed.

## 2023-01-31 NOTE — Assessment & Plan Note (Signed)
Problem has been well-controlled, hemoglobin A1c went from 6.8 to 6.2. Continue nonpharmacologic treatment. Annual eye exam and foot care recommended. F/U in 5-6 months.

## 2023-01-31 NOTE — Assessment & Plan Note (Signed)
Last Cr slightly elevated at 1.4, his baseline is 1.3. Continue adequate hydration, low-salt diet, and avoidance of NSAIDs. We can plan on rechecking renal function next visit.

## 2023-01-31 NOTE — Assessment & Plan Note (Signed)
BP is adequately controlled. Continue amlodipine 2.5 mg daily and low-salt diet. Follow-up in 6 months, before if needed.

## 2023-01-31 NOTE — Patient Instructions (Signed)
A few things to remember from today's visit:  Type 2 diabetes mellitus with other specified complication, without long-term current use of insulin (Willie Parks) - Plan: POC HgB A1c  Essential hypertension  Mild intermittent asthma, uncomplicated - Plan: albuterol (VENTOLIN HFA) 108 (90 Base) MCG/ACT inhaler  No changes today. Can try plain Mucinex for cough. If any chest pain or palpitation or shortness of breath please let me know or seek immediate medical attention.  If you need refills for medications you take chronically, please call your pharmacy. Do not use My Chart to request refills or for acute issues that need immediate attention. If you send a my chart message, it may take a few days to be addressed, specially if I am not in the office.  Please be sure medication list is accurate. If a new problem present, please set up appointment sooner than planned today.

## 2023-02-01 ENCOUNTER — Encounter: Payer: Self-pay | Admitting: Family Medicine

## 2023-02-03 ENCOUNTER — Ambulatory Visit (INDEPENDENT_AMBULATORY_CARE_PROVIDER_SITE_OTHER)
Admission: RE | Admit: 2023-02-03 | Discharge: 2023-02-03 | Disposition: A | Discharge status: Home / Self Care | Payer: Medicare HMO | Source: Ambulatory Visit | Attending: Family Medicine | Admitting: Family Medicine

## 2023-02-03 DIAGNOSIS — M19012 Primary osteoarthritis, left shoulder: Secondary | ICD-10-CM | POA: Diagnosis not present

## 2023-02-03 DIAGNOSIS — M25512 Pain in left shoulder: Secondary | ICD-10-CM

## 2023-02-04 ENCOUNTER — Other Ambulatory Visit (HOSPITAL_COMMUNITY): Payer: Self-pay

## 2023-02-04 MED ORDER — DICLOFENAC SODIUM 1 % EX GEL
4.0000 g | Freq: Four times a day (QID) | CUTANEOUS | 1 refills | Status: AC
  Filled 2023-02-04: qty 200, 13d supply, fill #0

## 2023-02-04 NOTE — Addendum Note (Signed)
Addended by: Martinique, Malayshia All G on: 02/04/2023 07:07 AM   Modules accepted: Orders

## 2023-02-04 NOTE — Assessment & Plan Note (Signed)
This is a chronic problem, most likely related to OA. He is not interested in PT at this time. Tylenol 500 mg 3-4 times per day as needed. Topical Voltaren gel may also help. Shoulder X ray will be obtained.

## 2023-02-07 ENCOUNTER — Other Ambulatory Visit (HOSPITAL_COMMUNITY): Payer: Self-pay

## 2023-02-12 ENCOUNTER — Other Ambulatory Visit (HOSPITAL_COMMUNITY): Payer: Self-pay

## 2023-02-13 ENCOUNTER — Other Ambulatory Visit (HOSPITAL_COMMUNITY): Payer: Self-pay

## 2023-02-13 MED ORDER — MOXIFLOXACIN HCL 0.5 % OP SOLN
1.0000 [drp] | Freq: Four times a day (QID) | OPHTHALMIC | 1 refills | Status: DC
  Filled 2023-02-13: qty 3, 15d supply, fill #0
  Filled 2023-03-26: qty 3, 15d supply, fill #1

## 2023-02-13 MED ORDER — KETOROLAC TROMETHAMINE 0.5 % OP SOLN
1.0000 [drp] | Freq: Four times a day (QID) | OPHTHALMIC | 1 refills | Status: DC
  Filled 2023-02-13: qty 5, 25d supply, fill #0

## 2023-02-13 MED ORDER — PREDNISOLONE ACETATE 1 % OP SUSP
1.0000 [drp] | Freq: Four times a day (QID) | OPHTHALMIC | 1 refills | Status: DC
  Filled 2023-02-13: qty 5, 25d supply, fill #0

## 2023-02-14 ENCOUNTER — Other Ambulatory Visit: Payer: Self-pay | Admitting: Family Medicine

## 2023-02-14 ENCOUNTER — Other Ambulatory Visit (HOSPITAL_COMMUNITY): Payer: Self-pay

## 2023-02-14 DIAGNOSIS — I10 Essential (primary) hypertension: Secondary | ICD-10-CM

## 2023-02-14 MED ORDER — AMLODIPINE BESYLATE 2.5 MG PO TABS
2.5000 mg | ORAL_TABLET | Freq: Every day | ORAL | 3 refills | Status: DC
  Filled 2023-02-14: qty 90, 90d supply, fill #0
  Filled 2023-03-05: qty 79, 79d supply, fill #0
  Filled 2023-03-05: qty 11, 11d supply, fill #0
  Filled 2023-06-04: qty 90, 90d supply, fill #1
  Filled 2023-09-15: qty 90, 90d supply, fill #2
  Filled 2023-12-09: qty 90, 90d supply, fill #3

## 2023-02-17 ENCOUNTER — Other Ambulatory Visit (HOSPITAL_COMMUNITY): Payer: Self-pay

## 2023-02-17 ENCOUNTER — Other Ambulatory Visit: Payer: Self-pay | Admitting: Family Medicine

## 2023-02-17 DIAGNOSIS — E785 Hyperlipidemia, unspecified: Secondary | ICD-10-CM

## 2023-02-17 DIAGNOSIS — G459 Transient cerebral ischemic attack, unspecified: Secondary | ICD-10-CM

## 2023-02-18 ENCOUNTER — Other Ambulatory Visit (HOSPITAL_COMMUNITY): Payer: Self-pay

## 2023-02-19 ENCOUNTER — Other Ambulatory Visit (HOSPITAL_COMMUNITY): Payer: Self-pay

## 2023-02-19 MED ORDER — ATORVASTATIN CALCIUM 20 MG PO TABS
20.0000 mg | ORAL_TABLET | ORAL | 3 refills | Status: DC
  Filled 2023-02-19: qty 45, 90d supply, fill #0
  Filled 2023-05-20: qty 45, 90d supply, fill #1
  Filled 2023-08-19: qty 45, 90d supply, fill #2
  Filled 2023-11-22: qty 45, 90d supply, fill #3

## 2023-02-27 DIAGNOSIS — C61 Malignant neoplasm of prostate: Secondary | ICD-10-CM | POA: Diagnosis not present

## 2023-02-27 DIAGNOSIS — N320 Bladder-neck obstruction: Secondary | ICD-10-CM | POA: Diagnosis not present

## 2023-03-05 ENCOUNTER — Other Ambulatory Visit (HOSPITAL_COMMUNITY): Payer: Self-pay

## 2023-03-05 DIAGNOSIS — H2511 Age-related nuclear cataract, right eye: Secondary | ICD-10-CM | POA: Diagnosis not present

## 2023-03-06 ENCOUNTER — Other Ambulatory Visit (HOSPITAL_COMMUNITY): Payer: Self-pay

## 2023-03-06 ENCOUNTER — Other Ambulatory Visit: Payer: Self-pay

## 2023-03-06 DIAGNOSIS — H2512 Age-related nuclear cataract, left eye: Secondary | ICD-10-CM | POA: Diagnosis not present

## 2023-03-06 MED ORDER — KETOROLAC TROMETHAMINE 0.5 % OP SOLN
1.0000 [drp] | Freq: Four times a day (QID) | OPHTHALMIC | 1 refills | Status: DC
  Filled 2023-03-06: qty 5, 25d supply, fill #0

## 2023-03-06 MED ORDER — MOXIFLOXACIN HCL 0.5 % OP SOLN
1.0000 [drp] | Freq: Four times a day (QID) | OPHTHALMIC | 1 refills | Status: DC
  Filled 2023-03-06: qty 3, 15d supply, fill #0
  Filled 2023-03-26: qty 3, 15d supply, fill #1

## 2023-03-06 MED ORDER — PREDNISOLONE ACETATE 1 % OP SUSP
1.0000 [drp] | Freq: Four times a day (QID) | OPHTHALMIC | 1 refills | Status: DC
  Filled 2023-03-06: qty 5, 25d supply, fill #0

## 2023-03-07 ENCOUNTER — Other Ambulatory Visit (HOSPITAL_COMMUNITY): Payer: Self-pay

## 2023-03-12 ENCOUNTER — Telehealth: Payer: Self-pay | Admitting: Family Medicine

## 2023-03-12 NOTE — Telephone Encounter (Signed)
FMLA paperwork to be filled out--placed in dr's folder.  Fax to [phone] upon completion.

## 2023-03-12 NOTE — Telephone Encounter (Signed)
Spouse called to ask if MD would be willing to complete FMLA papers for Pt's son, so that he can come help take care of Pt after surgery?  Spouse was informed that she could drop off the FMLA papers in person or fax them to Dr. Martinique.  Spouse stated she would drop them off, but says she has many questions and would like a call back to discuss.  Please return Spouse's call at your earliest convenience.

## 2023-03-12 NOTE — Telephone Encounter (Signed)
Completed, on pcp's desk for signature. 

## 2023-03-12 NOTE — Telephone Encounter (Signed)
I spoke with patient's spouse. She will bring the papers by today. Patient is having a second surgery on 3/27. Patient's son start date for missing work will be 3/25 with an undetermined return date to see how patient is progressing. Patient's spouse needs the extra help at home to take care of patient.

## 2023-03-12 NOTE — Telephone Encounter (Signed)
Paperwork received.

## 2023-03-14 NOTE — Telephone Encounter (Signed)
Patients wife aware

## 2023-03-14 NOTE — Telephone Encounter (Signed)
Paperwork faxed, copy sent to scan, originals placed up front for patient's wife to pick.

## 2023-03-17 ENCOUNTER — Other Ambulatory Visit (HOSPITAL_COMMUNITY): Payer: Self-pay

## 2023-03-19 DIAGNOSIS — H2512 Age-related nuclear cataract, left eye: Secondary | ICD-10-CM | POA: Diagnosis not present

## 2023-03-24 ENCOUNTER — Other Ambulatory Visit (HOSPITAL_COMMUNITY): Payer: Self-pay

## 2023-03-24 DIAGNOSIS — Z20822 Contact with and (suspected) exposure to covid-19: Secondary | ICD-10-CM | POA: Diagnosis not present

## 2023-03-24 DIAGNOSIS — B349 Viral infection, unspecified: Secondary | ICD-10-CM | POA: Diagnosis not present

## 2023-03-24 DIAGNOSIS — R059 Cough, unspecified: Secondary | ICD-10-CM | POA: Diagnosis not present

## 2023-03-24 MED ORDER — PROMETHAZINE-DM 6.25-15 MG/5ML PO SYRP
5.0000 mL | ORAL_SOLUTION | ORAL | 0 refills | Status: DC | PRN
  Filled 2023-03-24: qty 210, 7d supply, fill #0

## 2023-03-26 ENCOUNTER — Other Ambulatory Visit: Payer: Self-pay

## 2023-03-27 ENCOUNTER — Other Ambulatory Visit (HOSPITAL_COMMUNITY): Payer: Self-pay

## 2023-03-27 ENCOUNTER — Other Ambulatory Visit: Payer: Self-pay

## 2023-03-27 MED ORDER — KETOROLAC TROMETHAMINE 0.5 % OP SOLN
1.0000 [drp] | Freq: Four times a day (QID) | OPHTHALMIC | 1 refills | Status: DC
  Filled 2023-03-27 (×2): qty 5, 25d supply, fill #0

## 2023-04-11 ENCOUNTER — Other Ambulatory Visit (HOSPITAL_COMMUNITY): Payer: Self-pay

## 2023-05-20 ENCOUNTER — Other Ambulatory Visit (HOSPITAL_COMMUNITY): Payer: Self-pay

## 2023-06-04 ENCOUNTER — Other Ambulatory Visit (HOSPITAL_COMMUNITY): Payer: Self-pay

## 2023-06-13 ENCOUNTER — Other Ambulatory Visit (HOSPITAL_COMMUNITY): Payer: Self-pay

## 2023-07-01 ENCOUNTER — Other Ambulatory Visit (HOSPITAL_COMMUNITY): Payer: Self-pay

## 2023-07-16 NOTE — Progress Notes (Unsigned)
ACUTE VISIT Chief Complaint  Patient presents with   Shoulder Pain    Left shoulder   Cough    Off & on, x a few weeks   HPI: Willie Parks is a 77 y.o. male, who is here today complaining of persistent left shoulder pain since February/2023. He describes the pain as a 6 or 7 out of 10 in severity. He experiences difficulty when putting his arm back, feeling like it "slips out of place", and has to use his right arm to assist it. However, he denies any numbness or tingling in the area.  Shoulder Pain  The pain is present in the left shoulder. This is a chronic problem. The current episode started more than 1 month ago. There has been no history of extremity trauma. The problem occurs intermittently. The pain is moderate. Associated symptoms include a limited range of motion. Pertinent negatives include no fever, inability to bear weight, itching, joint locking or joint swelling.   Shoulder X ray in 01/2023:  1. 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 2. Mild degenerative changes at the acromioclavicular join  He was referred to PT in 01/2022, did not start it.  HTN on Amlodipine 2.5 mg daily. He is not monitoring BP. Denies severe/frequent headache, visual changes, chest pain, dyspnea, palpitation, focal weakness, or edema.  Occasional productive cough. No hemoptysis. No associated wheezing or DOE. Asthma: He is on Advair 250-50 mcg once daily and Albuterol inh qid prn.  CKD III: Negative for gross hematuria, foam in urine,or decreased urine output. Hx of prostate cancer, follows with urologist annually.  Lab Results  Component Value Date   NA 134 (L) 01/24/2023   CL 99 01/24/2023   K 4.3 01/24/2023   CO2 25 01/24/2023   BUN 10 01/24/2023   CREATININE 1.41 (H) 01/24/2023   GFRNONAA 51 (L) 01/24/2023   CALCIUM 9.5 01/24/2023   ALBUMIN 4.4 01/24/2023   GLUCOSE 101 (H) 01/24/2023   Vit D def: He is on OTC vit D supplementation.  Diabetes Mellitus II:  Dx'ed in 10/2021  He is on non pharmacologic treatment. He is not checking BS's. Negative for symptoms of hypoglycemia, polyuria, polydipsia, numbness extremities, foot ulcers/trauma  Lab Results  Component Value Date   HGBA1C 6.2 01/31/2023   Lab Results  Component Value Date   MICROALBUR 5.1 (H) 09/03/2022   Review of Systems  Constitutional:  Negative for activity change, appetite change, chills and fever.  HENT:  Negative for mouth sores, sore throat and trouble swallowing.   Gastrointestinal:  Negative for abdominal pain, nausea and vomiting.  Genitourinary:  Negative for dysuria.  Skin:  Negative for itching and rash.  Neurological:  Negative for syncope and facial asymmetry.  See other pertinent positives and negatives in HPI.  Current Outpatient Medications on File Prior to Visit  Medication Sig Dispense Refill   acetaminophen (TYLENOL) 500 MG tablet Take 500-1,000 mg by mouth every 6 (six) hours as needed for fever, mild pain or headache.     albuterol (VENTOLIN HFA) 108 (90 Base) MCG/ACT inhaler Inhale 2 puffs into the lungs every 6 (six) hours as needed for wheezing or shortness of breath. 6.7 g 2   amLODipine (NORVASC) 2.5 MG tablet Take 1 tablet (2.5 mg total) by mouth daily. 90 tablet 3   ascorbic acid (VITAMIN C) 500 MG tablet Take 1 tablet (500 mg total) by mouth daily.     aspirin EC 81 MG tablet Take 81 mg by mouth daily. Swallow  whole.     atorvastatin (LIPITOR) 20 MG tablet TAKE 1 TABLET BY MOUTH EVERY OTHER DAY     atorvastatin (LIPITOR) 20 MG tablet Take 1 tablet (20 mg) by mouth every other day. 45 tablet 3   Calcium Carb-Cholecalciferol (CALCIUM + D3 PO) Take 1 tablet by mouth daily.     dextromethorphan-guaiFENesin (MUCINEX DM) 30-600 MG 12hr tablet Take 1 tablet by mouth 2 (two) times daily. 30 tablet 0   diclofenac Sodium (VOLTAREN) 1 % GEL Apply 4 g topically 4 (four) times daily. 150 g 1   fluticasone-salmeterol (ADVAIR DISKUS) 250-50 MCG/ACT AEPB Inhale  1 puff into the lungs twice daily (Patient taking differently: Inhale 1 puff into the lungs in the morning.) 180 each 2   fluticasone-salmeterol (ADVAIR DISKUS) 250-50 MCG/ACT AEPB Inhale 1 puff into the lungs twice daily     ketorolac (ACULAR) 0.5 % ophthalmic solution Place 1 drop into the right eye 4 (four) times daily as directed 5 mL 1   ketorolac (ACULAR) 0.5 % ophthalmic solution Place 1 drop into the left eye 4 times daily. 5 mL 1   loratadine (CLARITIN) 10 MG tablet Take 1 tablet (10 mg total) by mouth daily. 30 tablet 11   moxifloxacin (VIGAMOX) 0.5 % ophthalmic solution Place 1 drop into the right eye 4 (four) times daily as directed 3 mL 1   moxifloxacin (VIGAMOX) 0.5 % ophthalmic solution Place 1 drop into the left eye 4 times daily. 3 mL 1   prednisoLONE acetate (PRED FORTE) 1 % ophthalmic suspension Place 1 drop into the right eye 4 (four) times daily as directed (shake well before each use) 5 mL 1   prednisoLONE acetate (PRED FORTE) 1 % ophthalmic suspension Place 1 drop into the left eye 4 times daily (shake well) 5 mL 1   promethazine-dextromethorphan (PROMETHAZINE-DM) 6.25-15 MG/5ML syrup Take 5 mLs by mouth every 4 (four) hours as needed for cough upto 7 days 210 mL 0   zinc sulfate 220 (50 Zn) MG capsule Take 1 capsule (220 mg total) by mouth daily.     No current facility-administered medications on file prior to visit.   Past Medical History:  Diagnosis Date   Allergy    Asthma    Chicken pox    Hyperlipidemia    Prostate cancer (HCC)    Allergies  Allergen Reactions   Penicillins Other (See Comments)    Dizziness     Social History   Socioeconomic History   Marital status: Married    Spouse name: Not on file   Number of children: Not on file   Years of education: Not on file   Highest education level: Not on file  Occupational History   Occupation: retired  Tobacco Use   Smoking status: Never   Smokeless tobacco: Never  Vaping Use   Vaping status:  Never Used  Substance and Sexual Activity   Alcohol use: Not Currently    Alcohol/week: 1.0 standard drink of alcohol    Types: 1 Standard drinks or equivalent per week    Comment: per week   Drug use: No   Sexual activity: Not Currently  Other Topics Concern   Not on file  Social History Narrative   Second marriage. Resides with wife in [place]. Retired. Has one grown child. 15 grandchildren to include his step grandchildren.    10-08-18 Unable to ask abuse questions wife with him today.   Social Determinants of Health   Financial Resource Strain: Low Risk  (  09/04/2022)   Overall Financial Resource Strain (CARDIA)    Difficulty of Paying Living Expenses: Not hard at all  Food Insecurity: No Food Insecurity (01/24/2023)   Hunger Vital Sign    Worried About Running Out of Food in the Last Year: Never true    Ran Out of Food in the Last Year: Never true  Transportation Needs: No Transportation Needs (01/24/2023)   PRAPARE - Administrator, Civil Service (Medical): No    Lack of Transportation (Non-Medical): No  Physical Activity: Inactive (09/04/2022)   Exercise Vital Sign    Days of Exercise per Week: 0 days    Minutes of Exercise per Session: 0 min  Stress: No Stress Concern Present (09/04/2022)   [employer] of Occupational Health - Occupational Stress Questionnaire    Feeling of Stress : Not at all  Social Connections: Socially Integrated (09/04/2022)   Social Connection and Isolation Panel [NHANES]    Frequency of Communication with Friends and Family: More than three times a week    Frequency of Social Gatherings with Friends and Family: More than three times a week    Attends Religious Services: More than 4 times per year    Active Member of Clubs or Organizations: Yes    Attends Banker Meetings: More than 4 times per year    Marital Status: Married    Vitals:   07/18/23 0724  BP: 110/70  Pulse: 62  Temp: 98.1 F (36.7 C)  SpO2: 98%    Wt Readings from Last 3 Encounters:  07/18/23 177 lb (80.3 kg)  01/31/23 180 lb (81.6 kg)  01/24/23 182 lb (82.6 kg)  Body mass index is 22.73 kg/m.  Physical Exam Vitals and nursing note reviewed.  Constitutional:      General: He is not in acute distress.    Appearance: He is well-developed. He is not ill-appearing.  HENT:     Head: Normocephalic and atraumatic.  Eyes:     Conjunctiva/sclera: Conjunctivae normal.  Cardiovascular:     Rate and Rhythm: Normal rate and regular rhythm.     Heart sounds: No murmur heard. Pulmonary:     Effort: Pulmonary effort is normal. No respiratory distress.  Musculoskeletal:     Left shoulder: Tenderness (with ROM) present. No bony tenderness. Decreased range of motion (Mild: Abduction and rotation.). Normal pulse.     Comments: Shoulder: No deformity, edema, or erythema appreciated. Mild muscle atrophy. Willie Parks' test pos, empty can supraspinatus test positive, lift-Off Subscapularis test elicits mild pain.  Lymphadenopathy:     Cervical: No cervical adenopathy.  Skin:    General: Skin is warm.     Findings: No erythema or rash.  Neurological:     General: No focal deficit present.     Mental Status: He is alert and oriented to person, place, and time.     Gait: Gait normal.  Psychiatric:        Mood and Affect: Mood and affect normal.   ASSESSMENT AND PLAN:  Mr. Amaury was seen today for shoulder pain and cough.  Diagnoses and all orders for this visit: Lab Results  Component Value Date   HGBA1C 6.5 07/18/2023   Lab Results  Component Value Date   NA 139 07/18/2023   CL 104 07/18/2023   K 4.4 07/18/2023   CO2 26 07/18/2023   BUN 14 07/18/2023   CREATININE 1.21 07/18/2023   GFR 57.67 (L) 07/18/2023   CALCIUM 9.9 07/18/2023   ALBUMIN 4.4  01/24/2023   GLUCOSE 106 (H) 07/18/2023  Chronic left shoulder pain Assessment & Plan: Chronic, we discussed possible causes, most likely related to OA. Stable otherwise. Appt with  sport medicine will be arranged. Tylenol 500 mg 3-4 times per day as needed.  Orders: -     Ambulatory referral to Sports Medicine  Type 2 diabetes mellitus with other specified complication, without long-term current use of insulin [facility]) Assessment & Plan: Problem has been well-controlled,last HgA1C 6.2 in 01/2023. Continue nonpharmacologic treatment. Annual eye exam and foot care recommended. F/U in 5-6 months.  Orders: -     Hemoglobin A1c; Future  Essential hypertension Assessment & Plan: BP is adequately controlled. Continue amlodipine 2.5 mg daily as well as low-salt diet. Monitor BP regularly. Follow-up in 6 months.  Orders: -     Basic metabolic panel; Future  Stage 3a chronic kidney disease (HCC) Assessment & Plan: Last Cr 1.4 and e GFR 51. Continue adequate hydration, low-salt diet, and avoidance of NSAIDs.   Vitamin D deficiency, unspecified Assessment & Plan: He is on vit D supplementation. Further recommendations according to 25 OH vit D result.  Orders: -     VITAMIN D 25 Hydroxy (Vit-D Deficiency, Fractures); Future  Mild intermittent asthma, uncomplicated Assessment & Plan: This problem could be causing his cough. Recommend using his Advair 250-50 mcg bid, rinse after use. Continue Albuterol inh qid prn. Instructed about warning signs.  Orders: -     Fluticasone-Salmeterol; Inhale 1 puff into the lungs in the morning and at bedtime.  Dispense: 180 each; Refill: 2   Return in about 6 months (around 01/18/2024) for chronic problems.  Shelisha Gautier G. Swaz[place], MD  [facility]. [place] office.

## 2023-07-18 ENCOUNTER — Ambulatory Visit: Payer: Medicare HMO | Admitting: Family Medicine

## 2023-07-18 ENCOUNTER — Encounter: Payer: Self-pay | Admitting: Family Medicine

## 2023-07-18 VITALS — BP 110/70 | HR 62 | Temp 98.1°F | Resp 16 | Ht 74.0 in | Wt 177.0 lb

## 2023-07-18 DIAGNOSIS — M25512 Pain in left shoulder: Secondary | ICD-10-CM

## 2023-07-18 DIAGNOSIS — E1169 Type 2 diabetes mellitus with other specified complication: Secondary | ICD-10-CM | POA: Diagnosis not present

## 2023-07-18 DIAGNOSIS — E559 Vitamin D deficiency, unspecified: Secondary | ICD-10-CM

## 2023-07-18 DIAGNOSIS — J4520 Mild intermittent asthma, uncomplicated: Secondary | ICD-10-CM

## 2023-07-18 DIAGNOSIS — G8929 Other chronic pain: Secondary | ICD-10-CM

## 2023-07-18 DIAGNOSIS — N1831 Chronic kidney disease, stage 3a: Secondary | ICD-10-CM | POA: Diagnosis not present

## 2023-07-18 DIAGNOSIS — I10 Essential (primary) hypertension: Secondary | ICD-10-CM | POA: Diagnosis not present

## 2023-07-18 LAB — VITAMIN D 25 HYDROXY (VIT D DEFICIENCY, FRACTURES): VITD: 29.81 ng/mL — ABNORMAL LOW (ref 30.00–100.00)

## 2023-07-18 LAB — BASIC METABOLIC PANEL
BUN: 14 mg/dL (ref 6–23)
CO2: 26 mEq/L (ref 19–32)
Calcium: 9.9 mg/dL (ref 8.4–10.5)
Chloride: 104 mEq/L (ref 96–112)
Creatinine, Ser: 1.21 mg/dL (ref 0.40–1.50)
GFR: 57.67 mL/min — ABNORMAL LOW (ref >60.00)
Glucose, Bld: 106 mg/dL — ABNORMAL HIGH (ref 70–99)
Potassium: 4.4 mEq/L (ref 3.5–5.1)
Sodium: 139 mEq/L (ref 135–145)

## 2023-07-18 LAB — HEMOGLOBIN A1C: Hgb A1c MFr Bld: 6.5 % (ref 4.6–6.5)

## 2023-07-18 NOTE — Progress Notes (Deleted)
    I, Stevenson Clinch, CMA acting as a scribe for Clementeen Graham, MD.  Willie Parks is a 77 y.o. male who presents to [facility] today for chronic left shoulder pain. Pt referred by PCP Betty Swaziland. Pt was last seen byt Dr. Swaziland on 07/18/23 and noted Sx present x 1.5 years, intermittent. There has been no history of extremity trauma. The problem occurs intermittently. The pain is moderate. Associated symptoms include a limited range of motion. Pertinent negatives include no fever, inability to bear weight, itching, joint locking or joint swelling. Has dx of Stage 3a chronic kidney disease.   Today pt reports ***.  Treatments: diclofenac gel  Diagnostic Imaging:  02/03/23 - XR left shoulder   Pertinent review of systems: ***  Relevant historical information: ***   Exam:  There were no vitals taken for this visit. General: Well Developed, well nourished, and in no acute distress.   MSK: ***    Lab and Radiology Results No results found for this or any previous visit (from the past 72 hour(s)). No results found.     Assessment and Plan: 77 y.o. male with ***   PDMP not reviewed this encounter. No orders of the defined types were placed in this encounter.  No orders of the defined types were placed in this encounter.    Discussed warning signs or symptoms. Please see discharge instructions. Patient expresses understanding.   ***

## 2023-07-18 NOTE — Assessment & Plan Note (Signed)
He is on vit D supplementation. Further recommendations according to 25 OH vit D result.

## 2023-07-18 NOTE — Patient Instructions (Addendum)
A few things to remember from today's visit:  Chronic left shoulder pain - Plan: Ambulatory referral to Sports Medicine Appt with sport medicine will be arranged.  If you need refills for medications you take chronically, please call your pharmacy. Do not use My Chart to request refills or for acute issues that need immediate attention. If you send a my chart message, it may take a few days to be addressed, specially if I am not in the office.  Please be sure medication list is accurate. If a new problem present, please set up appointment sooner than planned today.

## 2023-07-19 ENCOUNTER — Other Ambulatory Visit (HOSPITAL_COMMUNITY): Payer: Self-pay

## 2023-07-19 MED ORDER — FLUTICASONE-SALMETEROL 250-50 MCG/ACT IN AEPB
1.0000 | INHALATION_SPRAY | Freq: Two times a day (BID) | RESPIRATORY_TRACT | 2 refills | Status: DC
Start: 2023-07-19 — End: 2024-09-01
  Filled 2023-07-19: qty 180, 90d supply, fill #0
  Filled 2023-07-21: qty 60, 30d supply, fill #0
  Filled 2023-08-08: qty 180, 90d supply, fill #0
  Filled 2023-08-08: qty 60, 30d supply, fill #0
  Filled 2023-11-03: qty 60, 30d supply, fill #1
  Filled 2024-01-31: qty 60, 30d supply, fill #2

## 2023-07-19 NOTE — Assessment & Plan Note (Signed)
Last Cr 1.4 and e GFR 51. Continue adequate hydration, low-salt diet, and avoidance of NSAIDs.

## 2023-07-19 NOTE — Assessment & Plan Note (Signed)
Problem has been well-controlled,last HgA1C 6.2 in 01/2023. Continue nonpharmacologic treatment. Annual eye exam and foot care recommended. F/U in 5-6 months.

## 2023-07-19 NOTE — Assessment & Plan Note (Signed)
BP is adequately controlled. Continue amlodipine 2.5 mg daily as well as low-salt diet. Monitor BP regularly. Follow-up in 6 months.

## 2023-07-19 NOTE — Assessment & Plan Note (Signed)
This problem could be causing his cough. Recommend using his Advair 250-50 mcg bid, rinse after use. Continue Albuterol inh qid prn. Instructed about warning signs.

## 2023-07-19 NOTE — Assessment & Plan Note (Signed)
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 Stable otherwise. Appt with sport medicine will be arranged. Tylenol 500 mg 3-4 times per day as needed.

## 2023-07-21 ENCOUNTER — Ambulatory Visit: Payer: Medicare HMO | Admitting: Family Medicine

## 2023-07-21 ENCOUNTER — Other Ambulatory Visit (HOSPITAL_COMMUNITY): Payer: Self-pay

## 2023-07-29 ENCOUNTER — Other Ambulatory Visit (HOSPITAL_COMMUNITY): Payer: Self-pay

## 2023-08-07 ENCOUNTER — Encounter (INDEPENDENT_AMBULATORY_CARE_PROVIDER_SITE_OTHER): Payer: Self-pay

## 2023-08-08 ENCOUNTER — Other Ambulatory Visit: Payer: Self-pay

## 2023-08-08 ENCOUNTER — Other Ambulatory Visit (HOSPITAL_COMMUNITY): Payer: Self-pay

## 2023-09-09 ENCOUNTER — Encounter: Payer: Self-pay | Admitting: Family Medicine

## 2023-09-09 ENCOUNTER — Ambulatory Visit (INDEPENDENT_AMBULATORY_CARE_PROVIDER_SITE_OTHER): Payer: Medicare HMO | Admitting: Family Medicine

## 2023-09-09 DIAGNOSIS — E1169 Type 2 diabetes mellitus with other specified complication: Secondary | ICD-10-CM

## 2023-09-09 DIAGNOSIS — Z0000 Encounter for general adult medical examination without abnormal findings: Secondary | ICD-10-CM

## 2023-09-09 NOTE — Patient Instructions (Addendum)
I really enjoyed getting to talk with you today! I am available on Tuesdays and Thursdays for virtual visits if you have any questions or concerns, or if I can be of any further assistance.   CHECKLIST FROM ANNUAL WELLNESS VISIT:  -Follow up (please call to schedule if not scheduled after visit):   -schedule visit with Dr. [place] in 1 week if cough not resolved with the allergy pill(allegra)   -yearly for annual wellness visit with primary care office  Here is a list of your preventive care/health maintenance measures and the plan for each if any are due:  PLAN For any measures below that may be due:   Health Maintenance  Topic Date Due   DTaP/Tdap/Td (1 - Tdap) Never done   Zoster Vaccines- Shingrix (1 of 2) Never done   Diabetic kidney evaluation - Urine ACR  09/04/2023   FOOT EXAM  09/04/2023   COVID-19 Vaccine (4 - 2023-24 season) 09/25/2023 (Originally 08/24/2023)   INFLUENZA VACCINE  03/22/2024 (Originally 07/24/2023)   HEMOGLOBIN A1C  01/18/2024   OPHTHALMOLOGY EXAM  03/18/2024   Diabetic kidney evaluation - eGFR measurement  07/17/2024   Medicare Annual Wellness (AWV)  09/08/2024   Hepatitis C Screening  Completed   HPV VACCINES  Aged Out   Pneumonia Vaccine 74+ Years old  Discontinued    -See a dentist at least yearly  -Get your eyes checked and then per your eye specialist's recommendations  -Other issues addressed today:   -I have included below further information regarding a healthy whole foods based diet, physical activity guidelines for adults, stress management and opportunities for social connections. I hope you find this information useful.   -----------------------------------------------------------------------------------------------------------------------------------------------------------------------------------------------------------------------------------------------------------  NUTRITION: -eat real food: lots of colorful vegetables (half the  plate) and fruits -5-7 servings of vegetables and fruits per day (fresh or steamed is best), exp. 2 servings of vegetables with lunch and dinner and 2 servings of fruit per day. Berries and greens such as kale and collards are great choices.  -consume on a regular basis: whole grains (make sure first ingredient on label contains the word "whole"), fresh fruits, fish, nuts, seeds, healthy oils (such as olive oil, avocado oil, grape seed oil) -may eat small amounts of dairy and lean meat on occasion, but avoid processed meats such as ham, bacon, lunch meat, etc. -drink water -try to avoid fast food and pre-packaged foods, processed meat -most experts advise limiting sodium to < 2300mg  per day, should limit further is any chronic conditions such as high blood pressure, heart disease, diabetes, etc. The American Heart Association advised that < 1500mg  is is ideal -try to avoid foods that contain any ingredients with names you do not recognize  -try to avoid sugar/sweets (except for the natural sugar that occurs in fresh fruit) -try to avoid sweet drinks -try to avoid white rice, white bread, pasta (unless whole grain), white or yellow potatoes  EXERCISE GUIDELINES FOR ADULTS: -if you wish to increase your physical activity, do so gradually and with the approval of your doctor -STOP and seek medical care immediately if you have any chest pain, chest discomfort or trouble breathing when starting or increasing exercise  -move and stretch your body, legs, feet and arms when sitting for long periods -Physical activity guidelines for optimal health in adults: -least 150 minutes per week of aerobic exercise (can talk, but not sing) once approved by your doctor, 20-30 minutes of sustained activity or two 10 minute episodes of sustained activity every day.  -  resistance training at least 2 days per week if approved by your doctor -balance exercises 3+ days per week:   Stand somewhere where you have something  sturdy to hold onto if you lose balance.    1) lift up on toes, start with 5x per day and work up to 20x   2) stand and lift on leg straight out to the side so that foot is a few inches of the floor, start with 5x each side and work up to 20x each side   3) stand on one foot, start with 5 seconds each side and work up to 20 seconds on each side  If you need ideas or help with getting more active:  -Silver sneakers https://tools.silversneakers.com  -Walk with a Doc: http://www.duncan-[email or web address]/  -try to include resistance (weight lifting/strength building) and balance exercises twice per week: or the following link for ideas: http://castillo-powell.com/  BuyDucts.dk  STRESS MANAGEMENT: -can try meditating, or just sitting quietly with deep breathing while intentionally relaxing all parts of your body for 5 minutes daily -if you need further help with stress, anxiety or depression please follow up with your primary doctor or contact the wonderful folks at [facility]: [phone]  SOCIAL CONNECTIONS: -options in [place] if you wish to engage in more social and exercise related activities:  -Silver sneakers https://tools.silversneakers.com  -Walk with a Doc: http://www.duncan-[email or web address]/  -Check out the [facility] Active Adults 50+ section on the [place] [employer] (hiking clubs, book clubs, cards and games, chess, exercise classes, aquatic classes and much more) - see the website for details: https://www.[place].gov/departments/parks-recreation/active-adults50  -YouTube has lots of exercise videos for different ages and abilities as well  -[facility] (a variety of indoor and outdoor inperson activities for adults). [phone]. [address]  -Virtual Online Classes (a variety of topics): see seniorplanet.org or call [phone]  -consider  volunteering at a school, hospice center, church, senior center or elsewhere

## 2023-09-09 NOTE — Progress Notes (Signed)
PATIENT CHECK-IN and HEALTH RISK ASSESSMENT QUESTIONNAIRE:  -completed by phone/video for upcoming Medicare Preventive Visit  Pre-Visit Check-in: 1)Vitals (height, wt, BP, etc) - record in vitals section for visit on day of visit Request home vitals (wt, BP, etc.) and enter into vitals, THEN update Vital Signs SmartPhrase below at the top of the HPI. See below.  2)Review and Update Medications, Allergies PMH, Surgeries, Social history in Epic 3)Hospitalizations in the last year with date/reason? No   4)Review and Update Care Team (patient's specialists) in Epic 5) Complete PHQ9 in Epic  6) Complete Fall Screening in Epic 7)Review all Health Maintenance Due and order under PCP if not done.  Medicare Wellness Patient Questionnaire:  Answer theses question about your habits: Do you drink alcohol? Nop  If yes, how many drinks do you have a day?no  Have you ever smoked?no  Quit date if applicable? No   How many packs a day do/did you smoke? No  Do you use smokeless tobacco?no  Do you use an illicit drugs? No  Do you exercises? No IF so, what type and how many days/minutes per week?no  Are you sexually active? Yes Number of partners?1 Typical breakfast: egg grits sausage coffee Typical lunch P& J sandwich Typical dinner: Meat, rice and Vegetables Typical snacks: cookie   Beverages: Water, juice   Answer theses question about you: Can you perform most household chores?Yes  Do you find it hard to follow a conversation in a noisy room?No  Do you often ask people to speak up or repeat themselves? NO  Do you feel that you have a problem with memory?NO  Do you balance your checkbook and or bank acounts? NO Do you feel safe at home?Yes  Last dentist visit? Unable to answer Do you need assistance with any of the following: Please note if so   Driving? NO   Feeding yourself? NO   Getting from bed to chair? NO   Getting to the toilet? NO   Bathing or showering? NO   Dressing yourself? NO    Managing money? NO   Climbing a flight of stairs? NO   Preparing meals? No     Do you have Advanced Directives in place (Living Will, Healthcare Power or Attorney)? NO, Working to get this done - will bring copy once done   Last eye Exam and location?3 months ago, [facility]   Do you currently use prescribed or non-prescribed narcotic or opioid pain medications?No   Do you have a history or close family history of breast, ovarian, tubal or peritoneal cancer or a family member with BRCA (breast cancer susceptibility 1 and 2) gene mutations? NO   Request home vitals (wt, BP, etc.) and enter into vitals, THEN update Vital Signs SmartPhrase below at the top of the HPI. See below.   Nurse/Assistant Credentials/time stamp: Stann Ore CMA    ----------------------------------------------------------------------------------------------------------------------------------------------------------------------------------------------------------------------  Vital Signs: Unable to obtain new vitals due to this being a telehealth visit.   MEDICARE ANNUAL PREVENTIVE CARE VISIT WITH PROVIDER (Welcome to Medicare, initial annual wellness or annual wellness exam)  Virtual Visit via Phone Note  I connected with Willie Parks on 09/09/23  by phone and verified that I am speaking with the correct person using two identifiers.  Location patient: home Location provider:work or home office Persons participating in the virtual visit: patient, provider  Concerns and/or follow up today: doing well. Has a cough from time to time - reports evaluated with PCP and had xray. Has had  a mild cough on and off - sometimes productive the last few weeks. No other symptoms. Wife thinks is allergies. No cp, sob, hemoptysis, wt loss, fevers. He refuses evaluation.   See HM section in Epic for other details of completed HM.    ROS: negative for report of fevers, unintentional weight loss, vision changes, vision  loss, hearing loss or change, chest pain, sob, hemoptysis, melena, hematochezia, hematuria, falls, bleeding or bruising, thoughts of suicide or self harm, memory loss  Patient-completed extensive health risk assessment - reviewed and discussed with the patient: See Health Risk Assessment completed with patient prior to the visit either above or in recent phone note. This was reviewed in detailed with the patient today and appropriate recommendations, orders and referrals were placed as needed per Summary below and patient instructions.   Review of Medical History: -PMH, PSH, Family History and current specialty and care providers reviewed and updated and listed below   Patient Care Team: [place], Betty G, MD as PCP - General (Family Medicine)   Past Medical History:  Diagnosis Date   Allergy    Asthma    Chicken pox    Hyperlipidemia    Prostate cancer [facility])     Past Surgical History:  Procedure Laterality Date   KNEE ARTHROSCOPY Right    WRIST SURGERY Right     Social History   Socioeconomic History   Marital status: Married    Spouse name: Not on file   Number of children: Not on file   Years of education: Not on file   Highest education level: Not on file  Occupational History   Occupation: retired  Tobacco Use   Smoking status: Never   Smokeless tobacco: Never  Vaping Use   Vaping status: Never Used  Substance and Sexual Activity   Alcohol use: Not Currently    Alcohol/week: 1.0 standard drink of alcohol    Types: 1 Standard drinks or equivalent per week    Comment: per week   Drug use: No   Sexual activity: Not Currently  Other Topics Concern   Not on file  Social History Narrative   Second marriage. Resides with wife in [place]. Retired. Has one grown child. 15 grandchildren to include his step grandchildren.    10-08-18 Unable to ask abuse questions wife with him today.   Social Determinants of Health   Financial Resource Strain: Low Risk  (09/04/2022)    Overall Financial Resource Strain (CARDIA)    Difficulty of Paying Living Expenses: Not hard at all  Food Insecurity: No Food Insecurity (01/24/2023)   Hunger Vital Sign    Worried About Running Out of Food in the Last Year: Never true    Ran Out of Food in the Last Year: Never true  Transportation Needs: No Transportation Needs (01/24/2023)   PRAPARE - Administrator, Civil Service (Medical): No    Lack of Transportation (Non-Medical): No  Physical Activity: Inactive (09/04/2022)   Exercise Vital Sign    Days of Exercise per Week: 0 days    Minutes of Exercise per Session: 0 min  Stress: No Stress Concern Present (09/04/2022)   [employer] of Occupational Health - Occupational Stress Questionnaire    Feeling of Stress : Not at all  Social Connections: Socially Integrated (09/04/2022)   Social Connection and Isolation Panel [NHANES]    Frequency of Communication with Friends and Family: More than three times a week    Frequency of Social Gatherings with Friends and  Family: More than three times a week    Attends Religious Services: More than 4 times per year    Active Member of Clubs or Organizations: Yes    Attends Banker Meetings: More than 4 times per year    Marital Status: Married  Catering manager Violence: Not At Risk (01/24/2023)   Humiliation, Afraid, Rape, and Kick questionnaire    Fear of Current or Ex-Partner: No    Emotionally Abused: No    Physically Abused: No    Sexually Abused: No    Family History  Problem Relation Age of Onset   Cancer Neg Hx    Diabetes Neg Hx    Prostate cancer Neg Hx    Colon cancer Neg Hx    Breast cancer Neg Hx     Current Outpatient Medications on File Prior to Visit  Medication Sig Dispense Refill   albuterol (VENTOLIN HFA) 108 (90 Base) MCG/ACT inhaler Inhale 2 puffs into the lungs every 6 (six) hours as needed for wheezing or shortness of breath. 6.7 g 2   amLODipine (NORVASC) 2.5 MG tablet Take 1  tablet (2.5 mg total) by mouth daily. 90 tablet 3   ascorbic acid (VITAMIN C) 500 MG tablet Take 1 tablet (500 mg total) by mouth daily.     aspirin EC 81 MG tablet Take 81 mg by mouth daily. Swallow whole.     atorvastatin (LIPITOR) 20 MG tablet TAKE 1 TABLET BY MOUTH EVERY OTHER DAY     atorvastatin (LIPITOR) 20 MG tablet Take 1 tablet (20 mg) by mouth every other day. 45 tablet 3   Calcium Carb-Cholecalciferol (CALCIUM + D3 PO) Take 1 tablet by mouth daily.     dextromethorphan-guaiFENesin (MUCINEX DM) 30-600 MG 12hr tablet Take 1 tablet by mouth 2 (two) times daily. 30 tablet 0   diclofenac Sodium (VOLTAREN) 1 % GEL Apply 4 g topically 4 (four) times daily. 150 g 1   fluticasone-salmeterol (ADVAIR DISKUS) 250-50 MCG/ACT AEPB Inhale 1 puff into the lungs in the morning and at bedtime. 180 each 2   ketorolac (ACULAR) 0.5 % ophthalmic solution Place 1 drop into the right eye 4 (four) times daily as directed 5 mL 1   loratadine (CLARITIN) 10 MG tablet Take 1 tablet (10 mg total) by mouth daily. 30 tablet 11   moxifloxacin (VIGAMOX) 0.5 % ophthalmic solution Place 1 drop into the right eye 4 (four) times daily as directed 3 mL 1   prednisoLONE acetate (PRED FORTE) 1 % ophthalmic suspension Place 1 drop into the right eye 4 (four) times daily as directed (shake well before each use) 5 mL 1   prednisoLONE acetate (PRED FORTE) 1 % ophthalmic suspension Place 1 drop into the left eye 4 times daily (shake well) 5 mL 1   zinc sulfate 220 (50 Zn) MG capsule Take 1 capsule (220 mg total) by mouth daily.     ketorolac (ACULAR) 0.5 % ophthalmic solution Place 1 drop into the left eye 4 times daily. 5 mL 1   moxifloxacin (VIGAMOX) 0.5 % ophthalmic solution Place 1 drop into the left eye 4 times daily. 3 mL 1   No current facility-administered medications on file prior to visit.    Allergies  Allergen Reactions   Penicillins Other (See Comments)    Dizziness        Physical Exam Vitals requested  from patient and listed below if patient had equipment and was able to obtain at home for this virtual  visit: There were no vitals filed for this visit. Estimated body mass index is 22.73 kg/m as calculated from the following:   Height as of 07/18/23: 6\' 2"  (1.88 m).   Weight as of 07/18/23: 177 lb (80.3 kg).  EKG (optional): deferred due to virtual visit  GENERAL: alert, oriented, no acute distress detected; full vision exam deferred due to pandemic and/or virtual encounter  MS: moves all visible extremities without noticeable abnormality  PSYCH/NEURO: pleasant and cooperative, no obvious depression or anxiety, speech and thought processing grossly intact, Cognitive function grossly intact  Flowsheet Row Office Visit from 09/09/2023 in [facility] [place]  PHQ-9 Total Score 0           09/09/2023   11:05 AM 07/18/2023    7:30 AM 01/31/2023   12:18 PM 01/15/2023    8:28 AM 09/04/2022    8:29 AM  Depression screen PHQ 2/9  Decreased Interest 0 0 0 0 0  Down, Depressed, Hopeless 0 0 0 0 0  PHQ - 2 Score 0 0 0 0 0  Altered sleeping 0 0 0 0 0  Tired, decreased energy 0 0 2 1 0  Change in appetite 0 0 0 0 0  Feeling bad or failure about yourself  0 0 0 0 0  Trouble concentrating 0 0 0 0 0  Moving slowly or fidgety/restless 0 0 0 0 0  Suicidal thoughts 0 0 0 0 0  PHQ-9 Score 0 0 2 1 0  Difficult doing work/chores Not difficult at all Not difficult at all Somewhat difficult Somewhat difficult Not difficult at all       09/03/2022   11:08 AM 09/04/2022    8:31 AM 01/15/2023    8:29 AM 01/31/2023   12:18 PM 09/09/2023   11:05 AM  Fall Risk  Falls in the past year? 0 0 0 0 0  Was there an injury with Fall? 0 0 0 0 0  Fall Risk Category Calculator 0 0 0 0 0  Fall Risk Category (Retired) Low Low     (RETIRED) Patient Fall Risk Level Low fall risk Low fall risk     Patient at Risk for Falls Due to Other (Comment) No Fall Risks No Fall Risks Other (Comment) No Fall  Risks  Fall risk Follow up Falls evaluation completed Falls prevention discussed Falls evaluation completed Falls evaluation completed Falls evaluation completed     SUMMARY AND PLAN:  Encounter for Medicare annual wellness exam  Type 2 diabetes mellitus with other specified complication, without long-term current use of insulin (HCC)   Discussed applicable health maintenance/preventive health measures and advised and referred or ordered per patient preferences: -they are considering vaccines and agree to get at the pharmacy, advised to let us know when they do so that we can update  Health Maintenance  Topic Date Due   DTaP/Tdap/Td (1 - Tdap) Never done   Zoster Vaccines- Shingrix (1 of 2) Never done   Diabetic kidney evaluation - Urine ACR  09/04/2023   FOOT EXAM  09/04/2023   COVID-19 Vaccine (4 - 2023-24 season) 09/25/2023 (Originally 08/24/2023)   INFLUENZA VACCINE  03/22/2024 (Originally 07/24/2023)   HEMOGLOBIN A1C  01/18/2024   OPHTHALMOLOGY EXAM  03/18/2024   Diabetic kidney evaluation - eGFR measurement  07/17/2024   Medicare Annual Wellness (AWV)  09/08/2024   Hepatitis C Screening  Completed   HPV VACCINES  Aged Out   Pneumonia Vaccine 58+ Years old  Discontinued  Education and counseling on the following was provided based on the above review of health and a plan/checklist for the patient, along with additional information discussed, was provided for the patient in the patient instructions :  -Advised on importance of completing advanced directives, they are working on this and plan to bring copy to PCP when complete.  -advised to see PCP for the cough, he wants to try allergy pill (allegra)first for a week - agrees to see care inperson if persists -Provided safe balance exercises that can be done at home to improve balance and discussed exercise guidelines for adults with include balance exercises at least 3 days per week.  -Advised and counseled on a healthy  lifestyle -Reviewed patient's current diet. Advised and counseled on a whole foods based healthy diet. A summary of a healthy diet was provided in the Patient Instructions.  -reviewed patient's current physical activity level and discussed exercise guidelines for adults. Discussed community resources and ideas for safe exercise at home to assist in meeting exercise guideline recommendations in a safe and healthy way.   Follow up: see patient instructions   Patient Instructions  I really enjoyed getting to talk with you today! I am available on Tuesdays and Thursdays for virtual visits if you have any questions or concerns, or if I can be of any further assistance.   CHECKLIST FROM ANNUAL WELLNESS VISIT:  -Follow up (please call to schedule if not scheduled after visit):   -schedule visit with Dr. [place] in 1 week if cough not resolved with the allergy pill(allegra)   -yearly for annual wellness visit with primary care office  Here is a list of your preventive care/health maintenance measures and the plan for each if any are due:  PLAN For any measures below that may be due:   Health Maintenance  Topic Date Due   DTaP/Tdap/Td (1 - Tdap) Never done   Zoster Vaccines- Shingrix (1 of 2) Never done   Diabetic kidney evaluation - Urine ACR  09/04/2023   FOOT EXAM  09/04/2023   COVID-19 Vaccine (4 - 2023-24 season) 09/25/2023 (Originally 08/24/2023)   INFLUENZA VACCINE  03/22/2024 (Originally 07/24/2023)   HEMOGLOBIN A1C  01/18/2024   OPHTHALMOLOGY EXAM  03/18/2024   Diabetic kidney evaluation - eGFR measurement  07/17/2024   Medicare Annual Wellness (AWV)  09/08/2024   Hepatitis C Screening  Completed   HPV VACCINES  Aged Out   Pneumonia Vaccine 66+ Years old  Discontinued    -See a dentist at least yearly  -Get your eyes checked and then per your eye specialist's recommendations  -Other issues addressed today:   -I have included below further information regarding a healthy whole  foods based diet, physical activity guidelines for adults, stress management and opportunities for social connections. I hope you find this information useful.   -----------------------------------------------------------------------------------------------------------------------------------------------------------------------------------------------------------------------------------------------------------  NUTRITION: -eat real food: lots of colorful vegetables (half the plate) and fruits -5-7 servings of vegetables and fruits per day (fresh or steamed is best), exp. 2 servings of vegetables with lunch and dinner and 2 servings of fruit per day. Berries and greens such as kale and collards are great choices.  -consume on a regular basis: whole grains (make sure first ingredient on label contains the word "whole"), fresh fruits, fish, nuts, seeds, healthy oils (such as olive oil, avocado oil, grape seed oil) -may eat small amounts of dairy and lean meat on occasion, but avoid processed meats such as ham, bacon, lunch meat, etc. -drink water -try  to avoid fast food and pre-packaged foods, processed meat -most experts advise limiting sodium to < 2300mg  per day, should limit further is any chronic conditions such as high blood pressure, heart disease, diabetes, etc. The American Heart Association advised that < 1500mg  is is ideal -try to avoid foods that contain any ingredients with names you do not recognize  -try to avoid sugar/sweets (except for the natural sugar that occurs in fresh fruit) -try to avoid sweet drinks -try to avoid white rice, white bread, pasta (unless whole grain), white or yellow potatoes  EXERCISE GUIDELINES FOR ADULTS: -if you wish to increase your physical activity, do so gradually and with the approval of your doctor -STOP and seek medical care immediately if you have any chest pain, chest discomfort or trouble breathing when starting or increasing exercise  -move and  stretch your body, legs, feet and arms when sitting for long periods -Physical activity guidelines for optimal health in adults: -least 150 minutes per week of aerobic exercise (can talk, but not sing) once approved by your doctor, 20-30 minutes of sustained activity or two 10 minute episodes of sustained activity every day.  -resistance training at least 2 days per week if approved by your doctor -balance exercises 3+ days per week:   Stand somewhere where you have something sturdy to hold onto if you lose balance.    1) lift up on toes, start with 5x per day and work up to 20x   2) stand and lift on leg straight out to the side so that foot is a few inches of the floor, start with 5x each side and work up to 20x each side   3) stand on one foot, start with 5 seconds each side and work up to 20 seconds on each side  If you need ideas or help with getting more active:  -Silver sneakers https://tools.silversneakers.com  -Walk with a Doc: http://www.duncan-williams.com/  -try to include resistance (weight lifting/strength building) and balance exercises twice per week: or the following link for ideas: http://castillo-powell.com/  BuyDucts.dk  STRESS MANAGEMENT: -can try meditating, or just sitting quietly with deep breathing while intentionally relaxing all parts of your body for 5 minutes daily -if you need further help with stress, anxiety or depression please follow up with your primary doctor or contact the wonderful folks at [facility]: [phone]  SOCIAL CONNECTIONS: -options in [place] if you wish to engage in more social and exercise related activities:  -Silver sneakers https://tools.silversneakers.com  -Walk with a Doc: http://www.duncan-williams.com/  -Check out the [facility] Active Adults 50+ section on the [place] [employer] (hiking clubs, book clubs, cards and  games, chess, exercise classes, aquatic classes and much more) - see the website for details: https://www.[place].gov/departments/parks-recreation/active-adults50  -YouTube has lots of exercise videos for different ages and abilities as well  -[facility] (a variety of indoor and outdoor inperson activities for adults). 7162567476. [address]  -Virtual Online Classes (a variety of topics): see seniorplanet.org or call (769) [phone]  -consider volunteering at a school, hospice center, church, senior center or elsewhere           Terressa Koyanagi, DO

## 2023-09-09 NOTE — Progress Notes (Signed)
"  Patient was unable to self-report due to a lack of equipment at home via telehealth"

## 2023-09-15 ENCOUNTER — Other Ambulatory Visit (HOSPITAL_COMMUNITY): Payer: Self-pay

## 2023-10-03 ENCOUNTER — Ambulatory Visit: Payer: Medicare HMO | Admitting: Family Medicine

## 2023-10-03 ENCOUNTER — Encounter: Payer: Self-pay | Admitting: Family Medicine

## 2023-10-03 ENCOUNTER — Other Ambulatory Visit (HOSPITAL_COMMUNITY): Payer: Self-pay

## 2023-10-03 VITALS — BP 136/70 | HR 67 | Temp 97.6°F | Resp 16 | Ht 74.0 in | Wt 179.2 lb

## 2023-10-03 DIAGNOSIS — I10 Essential (primary) hypertension: Secondary | ICD-10-CM

## 2023-10-03 DIAGNOSIS — J4520 Mild intermittent asthma, uncomplicated: Secondary | ICD-10-CM | POA: Diagnosis not present

## 2023-10-03 DIAGNOSIS — M25561 Pain in right knee: Secondary | ICD-10-CM

## 2023-10-03 DIAGNOSIS — J988 Other specified respiratory disorders: Secondary | ICD-10-CM | POA: Diagnosis not present

## 2023-10-03 MED ORDER — DOXYCYCLINE HYCLATE 100 MG PO TABS
100.0000 mg | ORAL_TABLET | Freq: Two times a day (BID) | ORAL | 0 refills | Status: AC
Start: 2023-10-03 — End: 2023-10-10
  Filled 2023-10-03: qty 14, 7d supply, fill #0

## 2023-10-03 MED ORDER — ALBUTEROL SULFATE HFA 108 (90 BASE) MCG/ACT IN AERS
2.0000 | INHALATION_SPRAY | Freq: Four times a day (QID) | RESPIRATORY_TRACT | 2 refills | Status: AC | PRN
  Filled 2023-10-03: qty 6.7, 25d supply, fill #0

## 2023-10-03 NOTE — Progress Notes (Signed)
ACUTE VISIT Chief Complaint  Patient presents with   Cough    Productive cough, yellow/green phlegm.    Knee Pain    Right knee   HPI: [name] is a 77 y.o. male with a PMHx significant for HTN, TIA, DM, prostate cancer, asthma CKD III and HLD here today with his wife complaining of a productive cough for the last month and right knee pain.   He complains he has been coughing for a month. His cough has been productive with clear sputum but he has had green and yellow sputum for the past 2-3 days.  Cough This is a new problem. The current episode started 1 to 4 weeks ago. The problem has been unchanged. The problem occurs every few hours. The cough is Productive of sputum. Pertinent negatives include no chest pain, chills, ear congestion, ear pain, fever, headaches, heartburn, hemoptysis, myalgias, nasal congestion, postnasal drip, rash, rhinorrhea, sore throat, shortness of breath, sweats, weight loss or wheezing. Nothing aggravates the symptoms. He has tried steroid inhaler for the symptoms. His past medical history is significant for asthma and environmental allergies.   He says the cough is the same throughout the day.  He denies associated symptoms. He has not taken any antibiotics in the past few months. Patient has never smoked.  Asthma on Advair 250-50 mcg bid and Albuterol inh to use prn, he has not needed the latter one in a while.  Knee pain:  Medial right knee pain for the past couple months. He rates the pain as a 3-4/10.  He endorses  "little" swelling, but denies redness or significant limitation of ROM. Negative for unusual physical activity.  He has a history of arthroscopic surgery on that knee several years ago.   He is not taking anything for the pain.   Hypertension: SBP today 136. He is currently taking amlodipine 2.5 mg daily.  He has not been checking his blood pressure at home.   Lab Results  Component Value Date   CREATININE 1.21 07/18/2023    BUN 14 07/18/2023   NA 139 07/18/2023   K 4.4 07/18/2023   CL 104 07/18/2023   CO2 26 07/18/2023   Review of Systems  Constitutional:  Negative for chills, fever and weight loss.  HENT:  Negative for ear pain, postnasal drip, rhinorrhea and sore throat.   Respiratory:  Positive for cough. Negative for hemoptysis, shortness of breath and wheezing.   Cardiovascular:  Negative for chest pain.  Gastrointestinal:  Negative for abdominal pain, heartburn, nausea and vomiting.  Genitourinary:  Negative for decreased urine volume, dysuria and hematuria.  Musculoskeletal:  Positive for arthralgias. Negative for myalgias.  Skin:  Negative for rash.  Allergic/Immunologic: Positive for environmental allergies.  Neurological:  Negative for syncope and headaches.  Psychiatric/Behavioral:  Negative for confusion. The patient is not nervous/anxious.   See other pertinent positives and negatives in HPI.  Current Outpatient Medications on File Prior to Visit  Medication Sig Dispense Refill   amLODipine (NORVASC) 2.5 MG tablet Take 1 tablet (2.5 mg total) by mouth daily. 90 tablet 3   ascorbic acid (VITAMIN C) 500 MG tablet Take 1 tablet (500 mg total) by mouth daily.     aspirin EC 81 MG tablet Take 81 mg by mouth daily. Swallow whole.     atorvastatin (LIPITOR) 20 MG tablet TAKE 1 TABLET BY MOUTH EVERY OTHER DAY     atorvastatin (LIPITOR) 20 MG tablet Take 1 tablet (20 mg) by mouth every other  day. 45 tablet 3   Calcium Carb-Cholecalciferol (CALCIUM + D3 PO) Take 1 tablet by mouth daily.     diclofenac Sodium (VOLTAREN) 1 % GEL Apply 4 g topically 4 (four) times daily. 150 g 1   fluticasone-salmeterol (ADVAIR DISKUS) 250-50 MCG/ACT AEPB Inhale 1 puff into the lungs in the morning and at bedtime. 180 each 2   No current facility-administered medications on file prior to visit.   Past Medical History:  Diagnosis Date   Allergy    Asthma    Chicken pox    Hyperlipidemia    Prostate cancer (HCC)     Allergies  Allergen Reactions   Penicillins Other (See Comments)    Dizziness     Social History   Socioeconomic History   Marital status: Married    Spouse name: Not on file   Number of children: Not on file   Years of education: Not on file   Highest education level: Not on file  Occupational History   Occupation: retired  Tobacco Use   Smoking status: Never   Smokeless tobacco: Never  Vaping Use   Vaping status: Never Used  Substance and Sexual Activity   Alcohol use: Not Currently    Alcohol/week: 1.0 standard drink of alcohol    Types: 1 Standard drinks or equivalent per week    Comment: per week   Drug use: No   Sexual activity: Not Currently  Other Topics Concern   Not on file  Social History Narrative   Second marriage. Resides with wife in [place]. Retired. Has one grown child. 15 grandchildren to include his step grandchildren.    10-08-18 Unable to ask abuse questions wife with him today.   Social Determinants of Health   Financial Resource Strain: Low Risk  (09/04/2022)   Overall Financial Resource Strain (CARDIA)    Difficulty of Paying Living Expenses: Not hard at all  Food Insecurity: No Food Insecurity (01/24/2023)   Hunger Vital Sign    Worried About Running Out of Food in the Last Year: Never true    Ran Out of Food in the Last Year: Never true  Transportation Needs: No Transportation Needs (01/24/2023)   PRAPARE - Administrator, Civil Service (Medical): No    Lack of Transportation (Non-Medical): No  Physical Activity: Inactive (09/04/2022)   Exercise Vital Sign    Days of Exercise per Week: 0 days    Minutes of Exercise per Session: 0 min  Stress: No Stress Concern Present (09/04/2022)   [employer] of Occupational Health - Occupational Stress Questionnaire    Feeling of Stress : Not at all  Social Connections: Socially Integrated (09/04/2022)   Social Connection and Isolation Panel [NHANES]    Frequency of Communication  with Friends and Family: More than three times a week    Frequency of Social Gatherings with Friends and Family: More than three times a week    Attends Religious Services: More than 4 times per year    Active Member of [employer] or Organizations: Yes    Attends Engineer, structural: More than 4 times per year    Marital Status: Married   Vitals:   10/03/23 1005  BP: 136/70  Pulse: 67  Resp: 16  Temp: 97.6 F (36.4 C)  SpO2: 97%   Body mass index is 23.01 kg/m.  Physical Exam Vitals and nursing note reviewed.  Constitutional:      General: He is not in acute distress.  Appearance: He is well-developed.  HENT:     Head: Normocephalic and atraumatic.     Mouth/Throat:     Mouth: Mucous membranes are moist.     Dentition: Has dentures.  Eyes:     Conjunctiva/sclera: Conjunctivae normal.  Cardiovascular:     Rate and Rhythm: Normal rate and regular rhythm.     Heart sounds: No murmur heard. Pulmonary:     Effort: Pulmonary effort is normal. No respiratory distress.     Breath sounds: Normal breath sounds.  Musculoskeletal:     Right knee: Effusion and crepitus present. No erythema. Normal range of motion. No tenderness. No LCL laxity or MCL laxity.     Instability Tests: Anterior drawer test negative. Posterior drawer test negative.     Right lower leg: No edema.     Left lower leg: No edema.  Lymphadenopathy:     Cervical: No cervical adenopathy.  Skin:    General: Skin is warm.     Findings: No erythema or rash.  Neurological:     Mental Status: He is alert and oriented to person, place, and time.     Cranial Nerves: No cranial nerve deficit.     Gait: Gait normal.  Psychiatric:        Mood and Affect: Mood and affect normal.   ASSESSMENT AND PLAN:  Mr. Kleinert was seen today for a productive cough and right knee pain.   Acute pain of right knee We discussed possible etiologies, most likely OA. I do not think imaging is needed at this time. We reviewed  treatment options, he prefers to hold on intra-articular knee injection. Recommend Tylenol 500 mg 3-4 times per day as needed, OTC IcyHot on affected area may also help. Follow-up as needed.  Mild intermittent asthma, uncomplicated Assessment & Plan: Could be contributing to persistent cough. Albuterol inh 2 puff every 6 hours for a week then as needed for wheezing or shortness of breath.  Continue Advair 250-50 mcg bid. I do not think systemic steroids are necessary today.  Orders: -     Albuterol Sulfate HFA; Inhale 2 puffs into the lungs every 6 (six) hours as needed for wheezing or shortness of breath.  Dispense: 6.7 g; Refill: 2  Respiratory tract infection We discussed other possible causes of persistent cough like allergies, asthma,and GERD. Lung auscultation negative today but because sputum changed from clear to greening and persistent cough for a month, recommend trial of empiric abx, Doxycycline. We decided to hold on imaging for now,but it cough does not resolve, it will be warranted.  -     Doxycycline Hyclate; Take 1 tablet (100 mg total) by mouth 2 (two) times daily for 7 days.  Dispense: 14 tablet; Refill: 0  Essential hypertension Assessment & Plan: SBP today 136. Continue Amlodipine 2.5 mg daily and low salt diet. Recommend monitoring BP at home.  Return if symptoms worsen or fail to improve, for keep next appointment.  I, Rolla Etienne Wierda, acting as a scribe for Robbin Escher [place], MD., have documented all relevant documentation on the behalf of Oreoluwa Gilmer [place], MD, as directed by  Sukanya Goldblatt [place], MD while in the presence of Erving Sassano [place], MD.   I, Maan Zarcone [place], MD, have reviewed all documentation for this visit. The documentation on 10/03/23 for the exam, diagnosis, procedures, and orders are all accurate and complete.  Cote Mayabb G. [place], MD  [facility]. [place] [facility].

## 2023-10-03 NOTE — Patient Instructions (Addendum)
A few things to remember from today's visit:  Mild intermittent asthma, uncomplicated  Respiratory tract infection  Acute pain of right knee Because changes in sputum color and cough for a month we are going to try antibiotic but lungs were clear today. If cough is not resolved, imaging will be the next step.  If you need refills for medications you take chronically, please call your pharmacy. Do not use My Chart to request refills or for acute issues that need immediate attention. If you send a my chart message, it may take a few days to be addressed, specially if I am not in the office.  Please be sure medication list is accurate. If a new problem present, please set up appointment sooner than planned today.

## 2023-10-04 NOTE — Assessment & Plan Note (Addendum)
SBP today 136. Continue Amlodipine 2.5 mg daily and low salt diet. Recommend monitoring BP at home.

## 2023-10-04 NOTE — Assessment & Plan Note (Signed)
Could be contributing to persistent cough. Albuterol inh 2 puff every 6 hours for a week then as needed for wheezing or shortness of breath.  Continue Advair 250-50 mcg bid. I do not think systemic steroids are necessary today.

## 2023-10-09 ENCOUNTER — Other Ambulatory Visit (HOSPITAL_COMMUNITY): Payer: Self-pay

## 2023-10-13 ENCOUNTER — Other Ambulatory Visit (HOSPITAL_COMMUNITY): Payer: Self-pay

## 2023-11-03 ENCOUNTER — Other Ambulatory Visit (HOSPITAL_COMMUNITY): Payer: Self-pay

## 2023-11-22 ENCOUNTER — Other Ambulatory Visit (HOSPITAL_COMMUNITY): Payer: Self-pay

## 2023-11-22 ENCOUNTER — Other Ambulatory Visit: Payer: Self-pay

## 2023-12-12 ENCOUNTER — Other Ambulatory Visit (HOSPITAL_COMMUNITY): Payer: Self-pay

## 2024-01-21 ENCOUNTER — Ambulatory Visit (INDEPENDENT_AMBULATORY_CARE_PROVIDER_SITE_OTHER): Payer: Medicare HMO

## 2024-01-21 DIAGNOSIS — Z23 Encounter for immunization: Secondary | ICD-10-CM | POA: Diagnosis not present

## 2024-01-31 ENCOUNTER — Other Ambulatory Visit (HOSPITAL_COMMUNITY): Payer: Self-pay

## 2024-02-03 NOTE — Progress Notes (Signed)
HPI: [name] is a 78 y.o.male with a PMHx significant for TIA, HTN, asthma, DM, CKD III, prostate cancer, HLD, and vitamin D deficiency, who is here today with his wife for his routine physical examination.  Last CPE: 11/12/2021  Exercise: Patient admits he has not been exercising.  Diet: He is cooking at home, eating vegetables 3-4 times per week, and trying to avoid salt.  Sleep: 8-9 hours per night.  Alcohol Use: none Smoking: never Vision: UTD on routine vision care.  Dental: He doesn't see a dentist regularly  Immunization History  Administered Date(s) Administered   Fluad Quad(high Dose 65+) 09/24/2019, 11/06/2020, 11/12/2021, 10/10/2022   Fluad Trivalent(High Dose 65+) 01/21/2024   Influenza, High Dose Seasonal PF 01/30/2018   Influenza,inj,Quad PF,6+ Mos 01/22/2019   PFIZER(Purple Top)SARS-COV-2 Vaccination 03/23/2020, 04/18/2020, 12/30/2020    Health Maintenance  Topic Date Due   Diabetic kidney evaluation - Urine ACR  09/04/2023   HEMOGLOBIN A1C  01/18/2024   COVID-19 Vaccine (4 - 2024-25 season) 02/20/2024 (Originally 08/24/2023)   DTaP/Tdap/Td (1 - Tdap) 12/31/2024 (Originally 10/28/1965)   Zoster Vaccines- Shingrix (1 of 2) 12/31/2024 (Originally 10/28/1965)   OPHTHALMOLOGY EXAM  03/18/2024   Diabetic kidney evaluation - eGFR measurement  07/17/2024   Medicare Annual Wellness (AWV)  09/08/2024   FOOT EXAM  02/03/2025   INFLUENZA VACCINE  Completed   Hepatitis C Screening  Completed   HPV VACCINES  Aged Out   Pneumonia Vaccine 32+ Years old  Discontinued   Chronic medical problems:   Diabetes Mellitus II: Dx'ed in 10/2021.  He has not been checking his blood sugar at home. Not currently on pharmacologic treatment.  Foot exam: Performed today Negative for symptoms of hypoglycemia, polyuria,  polydipsia, numbness extremities, foot ulcers/trauma  Lab Results  Component Value Date   HGBA1C 6.5 07/18/2023   Lab Results  Component Value Date    MICROALBUR 5.1 (H) 09/03/2022   Hypertension: Currently on amlodipine 2.5 mg daily.   He is not checking his BP at home.  Side effects: none Negative for unusual or severe headache, exertional chest pain, dyspnea, or edema.  Lab Results  Component Value Date   CREATININE 1.21 07/18/2023   BUN 14 07/18/2023   NA 139 07/18/2023   K 4.4 07/18/2023   CL 104 07/18/2023   CO2 26 07/18/2023   Hyperlipidemia: Currently on atorvastatin 20 mg daily.   Lab Results  Component Value Date   CHOL 171 11/12/2021   HDL 67.40 11/12/2021   LDLCALC 81 11/12/2021   LDLDIRECT 115.0 06/18/2017   TRIG 114.0 11/12/2021   CHOLHDL 3 11/12/2021   Asthma:  Patient says he occasionally has to use his albuterol 108 (90 base) mcg/act inhaler. His insurance won't cover brand name Advair but he is getting the generic and he has been using it once daily.  He says he has had a cough for over a year. When coughing, it feels like there is mucous in his throat he cannot get out.  Denies wheezing, difficulty breathing, or acid reflux.   Vitamin D deficiency: He is taking OTC vitamin D supplementation.  Lab Results  Component Value Date   VD25OH 29.81 (L) 07/18/2023   Prostate cancer: He underwent external beam therapy in 2018 His follow up with urology will be in 02/2024  No new concerns today.   Review of Systems  Constitutional:  Negative for activity change, appetite change and fever.  HENT:  Negative for nosebleeds, sore throat and trouble swallowing.  Eyes:  Negative for redness and visual disturbance.  Respiratory:  Positive for cough. Negative for shortness of breath and wheezing.   Cardiovascular:  Negative for chest pain, palpitations and leg swelling.  Gastrointestinal:  Negative for abdominal pain, blood in stool, nausea and vomiting.  Endocrine: Negative for cold intolerance, heat intolerance, polydipsia, polyphagia and polyuria.  Genitourinary:  Negative for decreased urine volume, dysuria,  genital sores, hematuria and testicular pain.  Musculoskeletal:  Positive for arthralgias. Negative for myalgias.  Skin:  Negative for color change and rash.  Allergic/Immunologic: Positive for environmental allergies.  Neurological:  Negative for syncope, weakness and headaches.  Hematological:  Negative for adenopathy. Does not bruise/bleed easily.  Psychiatric/Behavioral:  Negative for confusion. The patient is not nervous/anxious.   All other systems reviewed and are negative.  Current Outpatient Medications on File Prior to Visit  Medication Sig Dispense Refill   albuterol (VENTOLIN HFA) 108 (90 Base) MCG/ACT inhaler Inhale 2 puffs into the lungs every 6 (six) hours as needed for wheezing or shortness of breath. 6.7 g 2   amLODipine (NORVASC) 2.5 MG tablet Take 1 tablet (2.5 mg total) by mouth daily. 90 tablet 3   ascorbic acid (VITAMIN C) 500 MG tablet Take 1 tablet (500 mg total) by mouth daily.     aspirin EC 81 MG tablet Take 81 mg by mouth daily. Swallow whole.     atorvastatin (LIPITOR) 20 MG tablet Take 1 tablet (20 mg) by mouth every other day. 45 tablet 3   Calcium Carb-Cholecalciferol (CALCIUM + D3 PO) Take 1 tablet by mouth daily.     diclofenac Sodium (VOLTAREN) 1 % GEL Apply 4 g topically 4 (four) times daily. 150 g 1   fluticasone-salmeterol (ADVAIR DISKUS) 250-50 MCG/ACT AEPB Inhale 1 puff into the lungs in the morning and at bedtime. 180 each 2   No current facility-administered medications on file prior to visit.   Past Medical History:  Diagnosis Date   Allergy    Asthma    Chicken pox    Hyperlipidemia    Prostate cancer (HCC)     Past Surgical History:  Procedure Laterality Date   KNEE ARTHROSCOPY Right    WRIST SURGERY Right     Allergies  Allergen Reactions   Penicillins Other (See Comments)    Dizziness     Family History  Problem Relation Age of Onset   Cancer Neg Hx    Diabetes Neg Hx    Prostate cancer Neg Hx    Colon cancer Neg Hx     Breast cancer Neg Hx     Social History   Socioeconomic History   Marital status: Married    Spouse name: Not on file   Number of children: Not on file   Years of education: Not on file   Highest education level: Not on file  Occupational History   Occupation: retired  Tobacco Use   Smoking status: Never   Smokeless tobacco: Never  Vaping Use   Vaping status: Never Used  Substance and Sexual Activity   Alcohol use: Not Currently    Alcohol/week: 1.0 standard drink of alcohol    Types: 1 Standard drinks or equivalent per week    Comment: per week   Drug use: No   Sexual activity: Not Currently  Other Topics Concern   Not on file  Social History Narrative   Second marriage. Resides with wife in [place]. Retired. Has one grown child. 15 grandchildren to include his step grandchildren.  10-08-18 Unable to ask abuse questions wife with him today.   Social Drivers of Corporate investment banker Strain: Low Risk  (09/04/2022)   Overall Financial Resource Strain (CARDIA)    Difficulty of Paying Living Expenses: Not hard at all  Food Insecurity: No Food Insecurity (01/24/2023)   Hunger Vital Sign    Worried About Running Out of Food in the Last Year: Never true    Ran Out of Food in the Last Year: Never true  Transportation Needs: No Transportation Needs (01/24/2023)   PRAPARE - Administrator, Civil Service (Medical): No    Lack of Transportation (Non-Medical): No  Physical Activity: Inactive (09/04/2022)   Exercise Vital Sign    Days of Exercise per Week: 0 days    Minutes of Exercise per Session: 0 min  Stress: No Stress Concern Present (09/04/2022)   [employer] of Occupational Health - Occupational Stress Questionnaire    Feeling of Stress : Not at all  Social Connections: Socially Integrated (09/04/2022)   Social Connection and Isolation Panel [NHANES]    Frequency of Communication with Friends and Family: More than three times a week    Frequency of  Social Gatherings with Friends and Family: More than three times a week    Attends Religious Services: More than 4 times per year    Active Member of [employer] or Organizations: Yes    Attends Banker Meetings: More than 4 times per year    Marital Status: Married   Vitals:   02/04/24 0722  BP: 128/80  Pulse: 70  Resp: 16  SpO2: 99%  Body mass index is 22.67 kg/m.  Wt Readings from Last 3 Encounters:  02/04/24 176 lb 9 oz (80.1 kg)  10/03/23 179 lb 4 oz (81.3 kg)  07/18/23 177 lb (80.3 kg)   Physical Exam Vitals and nursing note reviewed.  Constitutional:      General: He is not in acute distress.    Appearance: He is well-developed.  HENT:     Head: Normocephalic and atraumatic.     Right Ear: Tympanic membrane, ear canal and external ear normal.     Left Ear: Tympanic membrane, ear canal and external ear normal.     Mouth/Throat:     Mouth: Mucous membranes are moist.     Dentition: Has dentures.     Pharynx: Oropharynx is clear.  Eyes:     Conjunctiva/sclera: Conjunctivae normal.     Pupils: Pupils are equal, round, and reactive to light.  Neck:     Thyroid: No thyroid mass or thyromegaly.     Trachea: No tracheal deviation.  Cardiovascular:     Rate and Rhythm: Normal rate and regular rhythm.     Pulses:          Dorsalis pedis pulses are 2+ on the right side and 2+ on the left side.     Heart sounds: No murmur heard. Pulmonary:     Effort: Pulmonary effort is normal. No respiratory distress.     Breath sounds: Normal breath sounds.  Abdominal:     Palpations: Abdomen is soft. There is no hepatomegaly or mass.     Tenderness: There is no abdominal tenderness.  Genitourinary:    Comments: Deferred to urologist.. Musculoskeletal:        General: No tenderness.     Cervical back: Normal range of motion.     Right lower leg: No edema.     Left lower leg:  No edema.     Comments: No signs of synovitis.  Lymphadenopathy:     Cervical: No cervical  adenopathy.     Upper Body:     Right upper body: No supraclavicular adenopathy.     Left upper body: No supraclavicular adenopathy.  Skin:    General: Skin is warm.     Findings: No erythema.  Neurological:     General: No focal deficit present.     Mental Status: He is alert and oriented to person, place, and time.     Cranial Nerves: No cranial nerve deficit.     Sensory: No sensory deficit.     Motor: No weakness.     Gait: Gait normal.     Deep Tendon Reflexes:     Reflex Scores:      Bicep reflexes are 2+ on the right side and 2+ on the left side.      Patellar reflexes are 2+ on the right side and 2+ on the left side. Psychiatric:        Mood and Affect: Mood and affect normal.   ASSESSMENT AND PLAN:  Mr. Smola was seen today for his routine general medical examination.   Orders Placed This Encounter  Procedures   CT Chest Wo Contrast   Comprehensive metabolic panel   Microalbumin / creatinine urine ratio   Hemoglobin A1c   VITAMIN D 25 Hydroxy (Vit-D Deficiency, Fractures)   Lipid panel   Lab Results  Component Value Date   CHOL 145 02/04/2024   HDL 58.80 02/04/2024   LDLCALC 68 02/04/2024   LDLDIRECT 115.0 06/18/2017   TRIG 90.0 02/04/2024   CHOLHDL 2 02/04/2024   Lab Results  Component Value Date   NA 139 02/04/2024   CL 103 02/04/2024   K 4.1 02/04/2024   CO2 27 02/04/2024   BUN 9 02/04/2024   CREATININE 1.26 02/04/2024   GFR 54.72 (L) 02/04/2024   CALCIUM 9.7 02/04/2024   ALBUMIN 4.5 02/04/2024   GLUCOSE 112 (H) 02/04/2024   Lab Results  Component Value Date   ALT 15 02/04/2024   AST 18 02/04/2024   ALKPHOS 80 02/04/2024   BILITOT 1.0 02/04/2024   Lab Results  Component Value Date   HGBA1C 6.6 (H) 02/04/2024   Lab Results  Component Value Date   VD25OH 73.79 02/04/2024   Routine general medical examination at a health care facility Assessment & Plan: We discussed the importance of regular physical activity and healthy diet for  prevention of chronic illness and/or complications. Preventive guidelines reviewed. Vaccination declined. Next CPE in a year.   Essential hypertension Assessment & Plan: BP otherwise adequately controlled. Continue amlodipine 2.5 mg daily at low-salt diet. Monitor BP at home.  Orders: -     Comprehensive metabolic panel; Future  Type 2 diabetes mellitus with other specified complication, without long-term current use of insulin (HCC) Assessment & Plan: Probably has been adequately controlled. Last hemoglobin A1c 6.5 in 06/2023. Continue nonpharmacologic treatment. Further recommendation will be given according to hemoglobin A1c result.  Orders: -     Comprehensive metabolic panel; Future -     Microalbumin / creatinine urine ratio; Future -     Hemoglobin A1c; Future  Hyperlipidemia, unspecified hyperlipidemia type Assessment & Plan: Currently on atorvastatin 20 mg daily. Last LDL 81 in 10/2021. FLP ordered today.  Orders: -     Comprehensive metabolic panel; Future -     Lipid panel; Future  Vitamin D deficiency, unspecified Assessment &  Plan: Continue current dose of vitamin D supplementation. Further recommendation will be given according to 25 OH vitamin D result.  Orders: -     VITAMIN D 25 Hydroxy (Vit-D Deficiency, Fractures); Future  Malignant neoplasm of prostate [facility]) Assessment & Plan: Status post post external beam therapy in 2018. Follows with urology annually, next appointment in 02/2024.     Persistent cough Assessment & Plan: Reporting having cough since 01/2023, no other associated symptoms. Asthma and allergies can definitively being contributing factors. CXR in 01/2023 negative. Will arrange chest CT w/o contrast.  Orders: -     CT CHEST WO CONTRAST; Future  Return in 6 months (on 08/03/2024) for chronic problems.  I, Rolla Etienne Wierda, acting as a scribe for Xoie Kreuser [place], MD., have documented all relevant documentation on the behalf of  Rashunda Passon [place], MD, as directed by  Laban Orourke [place], MD while in the presence of Kesi Perrow [place], MD.   I, Felix Meras [place], MD, have reviewed all documentation for this visit. The documentation on 02/04/24 for the exam, diagnosis, procedures, and orders are all accurate and complete.  Christien Berthelot G. [place], MD  [facility]. [place] [facility].

## 2024-02-04 ENCOUNTER — Ambulatory Visit: Payer: Medicare HMO | Admitting: Family Medicine

## 2024-02-04 ENCOUNTER — Encounter: Payer: Self-pay | Admitting: Family Medicine

## 2024-02-04 VITALS — BP 128/80 | HR 70 | Resp 16 | Ht 74.0 in | Wt 176.6 lb

## 2024-02-04 DIAGNOSIS — E559 Vitamin D deficiency, unspecified: Secondary | ICD-10-CM | POA: Diagnosis not present

## 2024-02-04 DIAGNOSIS — I10 Essential (primary) hypertension: Secondary | ICD-10-CM | POA: Diagnosis not present

## 2024-02-04 DIAGNOSIS — E785 Hyperlipidemia, unspecified: Secondary | ICD-10-CM

## 2024-02-04 DIAGNOSIS — Z0000 Encounter for general adult medical examination without abnormal findings: Secondary | ICD-10-CM

## 2024-02-04 DIAGNOSIS — E1169 Type 2 diabetes mellitus with other specified complication: Secondary | ICD-10-CM

## 2024-02-04 DIAGNOSIS — C61 Malignant neoplasm of prostate: Secondary | ICD-10-CM | POA: Diagnosis not present

## 2024-02-04 DIAGNOSIS — R053 Chronic cough: Secondary | ICD-10-CM | POA: Diagnosis not present

## 2024-02-04 LAB — LIPID PANEL
Cholesterol: 145 mg/dL (ref 0–200)
HDL: 58.8 mg/dL (ref >39.00)
LDL Cholesterol: 68 mg/dL (ref 0–99)
NonHDL: 86.08
Total CHOL/HDL Ratio: 2
Triglycerides: 90 mg/dL (ref 0.0–149.0)
VLDL: 18 mg/dL (ref 0.0–40.0)

## 2024-02-04 LAB — COMPREHENSIVE METABOLIC PANEL
ALT: 15 U/L (ref 0–53)
AST: 18 U/L (ref 0–37)
Albumin: 4.5 g/dL (ref 3.5–5.2)
Alkaline Phosphatase: 80 U/L (ref 39–117)
BUN: 9 mg/dL (ref 6–23)
CO2: 27 meq/L (ref 19–32)
Calcium: 9.7 mg/dL (ref 8.4–10.5)
Chloride: 103 meq/L (ref 96–112)
Creatinine, Ser: 1.26 mg/dL (ref 0.40–1.50)
GFR: 54.72 mL/min — ABNORMAL LOW (ref >60.00)
Glucose, Bld: 112 mg/dL — ABNORMAL HIGH (ref 70–99)
Potassium: 4.1 meq/L (ref 3.5–5.1)
Sodium: 139 meq/L (ref 135–145)
Total Bilirubin: 1 mg/dL (ref 0.2–1.2)
Total Protein: 7.3 g/dL (ref 6.0–8.3)

## 2024-02-04 LAB — HEMOGLOBIN A1C: Hgb A1c MFr Bld: 6.6 % — ABNORMAL HIGH (ref 4.6–6.5)

## 2024-02-04 LAB — VITAMIN D 25 HYDROXY (VIT D DEFICIENCY, FRACTURES): VITD: 73.79 ng/mL (ref 30.00–100.00)

## 2024-02-04 NOTE — Assessment & Plan Note (Addendum)
Probably has been adequately controlled. Last hemoglobin A1c 6.5 in 06/2023. Continue nonpharmacologic treatment. Further recommendation will be given according to hemoglobin A1c result.

## 2024-02-04 NOTE — Assessment & Plan Note (Signed)
Reporting having cough since 01/2023, no other associated symptoms. Asthma and allergies can definitively being contributing factors. CXR in 01/2023 negative. Will arrange chest CT w/o contrast.

## 2024-02-04 NOTE — Assessment & Plan Note (Signed)
Currently on atorvastatin 20 mg daily. Last LDL 81 in 10/2021. FLP ordered today.

## 2024-02-04 NOTE — Patient Instructions (Addendum)
A few things to remember from today's visit:  Routine general medical examination at a health care facility  Essential hypertension - Plan: Comprehensive metabolic panel  Type 2 diabetes mellitus with other specified complication, without long-term current use of insulin (HCC) - Plan: Comprehensive metabolic panel, Microalbumin / creatinine urine ratio, Hemoglobin A1c  Hyperlipidemia, unspecified hyperlipidemia type - Plan: Comprehensive metabolic panel, Lipid panel  Vitamin D deficiency, unspecified - Plan: VITAMIN D 25 Hydroxy (Vit-D Deficiency, Fractures)  Stage 3a chronic kidney disease (HCC)  Malignant neoplasm of prostate (HCC), Chronic  Persistent cough - Plan: CT Chest Wo Contrast  If you need refills for medications you take chronically, please call your pharmacy. Do not use My Chart to request refills or for acute issues that need immediate attention. If you send a my chart message, it may take a few days to be addressed, specially if I am not in the office.  Please be sure medication list is accurate. If a new problem present, please set up appointment sooner than planned today.

## 2024-02-04 NOTE — Assessment & Plan Note (Signed)
Status post post external beam therapy in 2018. Follows with urology annually, next appointment in 02/2024.

## 2024-02-04 NOTE — Assessment & Plan Note (Signed)
BP otherwise adequately controlled. Continue amlodipine 2.5 mg daily at low-salt diet. Monitor BP at home.

## 2024-02-04 NOTE — Assessment & Plan Note (Signed)
Continue current dose of vitamin D supplementation. Further recommendation will be given according to 25 OH vitamin D result.

## 2024-02-04 NOTE — Assessment & Plan Note (Signed)
We discussed the importance of regular physical activity and healthy diet for prevention of chronic illness and/or complications. Preventive guidelines reviewed. Vaccination declined. Next CPE in a year.

## 2024-02-05 LAB — MICROALBUMIN / CREATININE URINE RATIO
Creatinine,U: 382.1 mg/dL
Microalb Creat Ratio: 38.9 mg/g — ABNORMAL HIGH (ref 0.0–30.0)
Microalb, Ur: 14.9 mg/dL — ABNORMAL HIGH (ref 0.0–1.9)

## 2024-02-10 NOTE — Addendum Note (Signed)
Addended by: Kathreen Devoid on: 02/10/2024 04:22 PM   Modules accepted: Orders

## 2024-02-11 ENCOUNTER — Encounter: Payer: Self-pay | Admitting: Family Medicine

## 2024-02-18 ENCOUNTER — Ambulatory Visit
Admission: RE | Admit: 2024-02-18 | Discharge: 2024-02-18 | Disposition: A | Discharge status: Home / Self Care | Payer: Medicare HMO | Source: Ambulatory Visit | Attending: Family Medicine | Admitting: Family Medicine

## 2024-02-18 DIAGNOSIS — R053 Chronic cough: Secondary | ICD-10-CM

## 2024-02-18 DIAGNOSIS — J479 Bronchiectasis, uncomplicated: Secondary | ICD-10-CM | POA: Diagnosis not present

## 2024-02-18 DIAGNOSIS — I2510 Atherosclerotic heart disease of native coronary artery without angina pectoris: Secondary | ICD-10-CM | POA: Diagnosis not present

## 2024-02-18 DIAGNOSIS — R911 Solitary pulmonary nodule: Secondary | ICD-10-CM | POA: Diagnosis not present

## 2024-02-19 ENCOUNTER — Other Ambulatory Visit: Payer: Self-pay | Admitting: Family Medicine

## 2024-02-19 DIAGNOSIS — G459 Transient cerebral ischemic attack, unspecified: Secondary | ICD-10-CM

## 2024-02-19 DIAGNOSIS — E785 Hyperlipidemia, unspecified: Secondary | ICD-10-CM

## 2024-02-20 ENCOUNTER — Other Ambulatory Visit (HOSPITAL_COMMUNITY): Payer: Self-pay

## 2024-02-20 MED ORDER — ATORVASTATIN CALCIUM 20 MG PO TABS
20.0000 mg | ORAL_TABLET | ORAL | 3 refills | Status: AC
  Filled 2024-02-20: qty 45, 90d supply, fill #0
  Filled 2024-05-21: qty 45, 90d supply, fill #1
  Filled 2024-08-14: qty 45, 90d supply, fill #2
  Filled 2024-11-13: qty 45, 90d supply, fill #3

## 2024-03-04 ENCOUNTER — Telehealth: Payer: Self-pay | Admitting: Family Medicine

## 2024-03-04 DIAGNOSIS — C61 Malignant neoplasm of prostate: Secondary | ICD-10-CM | POA: Diagnosis not present

## 2024-03-04 DIAGNOSIS — I499 Cardiac arrhythmia, unspecified: Secondary | ICD-10-CM

## 2024-03-04 DIAGNOSIS — I10 Essential (primary) hypertension: Secondary | ICD-10-CM

## 2024-03-04 DIAGNOSIS — N320 Bladder-neck obstruction: Secondary | ICD-10-CM | POA: Diagnosis not present

## 2024-03-04 NOTE — Telephone Encounter (Signed)
 Copied from CRM [id number]. Topic: Referral - Request for Referral >> Mar 04, 2024 11:01 AM Drema Balzarine wrote: Did the patient discuss referral with their provider in the last year? Yes (If No - schedule appointment) (If Yes - send message)  Appointment offered? No  Type of order/referral and detailed reason for visit: Urology recommend referral to Cardiologist due to patients heart rate being "off"  Preference of office, provider, location: [facility]  If referral order, have you been seen by this specialty before? No (If Yes, this issue or another issue? When? Where?  Can we respond through MyChart? No

## 2024-03-08 NOTE — Telephone Encounter (Signed)
 Referral placed.

## 2024-03-11 ENCOUNTER — Other Ambulatory Visit: Payer: Self-pay | Admitting: Family Medicine

## 2024-03-11 ENCOUNTER — Other Ambulatory Visit (HOSPITAL_COMMUNITY): Payer: Self-pay

## 2024-03-11 DIAGNOSIS — I10 Essential (primary) hypertension: Secondary | ICD-10-CM

## 2024-03-12 ENCOUNTER — Other Ambulatory Visit: Payer: Self-pay

## 2024-03-12 ENCOUNTER — Other Ambulatory Visit (HOSPITAL_COMMUNITY): Payer: Self-pay

## 2024-03-12 MED ORDER — AMLODIPINE BESYLATE 2.5 MG PO TABS
2.5000 mg | ORAL_TABLET | Freq: Every day | ORAL | 3 refills | Status: AC
  Filled 2024-03-12: qty 90, 90d supply, fill #0
  Filled 2024-05-14 – 2024-06-09 (×2): qty 90, 90d supply, fill #1
  Filled 2024-09-09: qty 90, 90d supply, fill #2
  Filled 2024-12-04: qty 90, 90d supply, fill #3

## 2024-03-13 ENCOUNTER — Other Ambulatory Visit (HOSPITAL_COMMUNITY): Payer: Self-pay

## 2024-03-13 DIAGNOSIS — H524 Presbyopia: Secondary | ICD-10-CM | POA: Diagnosis not present

## 2024-03-26 ENCOUNTER — Other Ambulatory Visit (HOSPITAL_COMMUNITY): Payer: Self-pay

## 2024-05-14 ENCOUNTER — Other Ambulatory Visit (HOSPITAL_COMMUNITY): Payer: Self-pay

## 2024-06-09 ENCOUNTER — Other Ambulatory Visit (HOSPITAL_COMMUNITY): Payer: Self-pay

## 2024-07-22 ENCOUNTER — Ambulatory Visit: Payer: Self-pay

## 2024-07-22 NOTE — Telephone Encounter (Signed)
 FYI Only or Action Required?: FYI only for provider.  Patient was last seen in primary care on 02/04/2024 by [place], Betty G, MD.  Called Nurse Triage reporting Cough.  Symptoms began several months ago.  Symptoms are: gradually worsening.  Triage Disposition: See PCP When Office is Open (Within 3 Days)  Patient/caregiver understands and will follow disposition?: Yes      Copied from CRM #[id number]. Topic: Clinical - Red Word Triage >> Jul 22, 2024 10:27 AM Drema MATSU wrote: Red Word that prompted transfer to Nurse Triage: Patient wife states that patient has a continuing cough and very fatigued all day.     Reason for Disposition  Cough has been present for > 3 weeks  Answer Assessment - Initial Assessment Questions 1. ONSET: When did the cough begin?      It's been going on for months 2. SEVERITY: How bad is the cough today?      Moderate  3. SPUTUM: Describe the color of your sputum (e.g., none, dry cough; clear, white, yellow, green)     No 4. HEMOPTYSIS: Are you coughing up any blood? If Yes, ask: How much? (e.g., flecks, streaks, tablespoons, etc.)     No 5. DIFFICULTY BREATHING: Are you having difficulty breathing? If Yes, ask: How bad is it? (e.g., mild, moderate, severe)      No 6. FEVER: Do you have a fever? If Yes, ask: What is your temperature, how was it measured, and when did it start?     No 7. CARDIAC HISTORY: Do you have any history of heart disease? (e.g., heart attack, congestive heart failure)      No 8. LUNG HISTORY: Do you have any history of lung disease?  (e.g., pulmonary embolus, asthma, emphysema)     Mild asthma  9. PE RISK FACTORS: Do you have a history of blood clots? (or: recent major surgery, recent prolonged travel, bedridden)     No 10. OTHER SYMPTOMS: Do you have any other symptoms? (e.g., runny nose, wheezing, chest pain)       Fatigue and left arm pain x2 weeks  Protocols used: Cough - Acute  Non-Productive-A-AH

## 2024-07-23 ENCOUNTER — Other Ambulatory Visit (HOSPITAL_COMMUNITY): Payer: Self-pay

## 2024-07-23 ENCOUNTER — Encounter: Payer: Self-pay | Admitting: Family Medicine

## 2024-07-23 ENCOUNTER — Ambulatory Visit: Admitting: Family Medicine

## 2024-07-23 ENCOUNTER — Ambulatory Visit: Payer: Self-pay | Admitting: Family Medicine

## 2024-07-23 VITALS — BP 120/70 | HR 73 | Temp 98.0°F | Resp 16 | Ht 74.0 in | Wt 173.2 lb

## 2024-07-23 DIAGNOSIS — J4520 Mild intermittent asthma, uncomplicated: Secondary | ICD-10-CM | POA: Diagnosis not present

## 2024-07-23 DIAGNOSIS — N1831 Chronic kidney disease, stage 3a: Secondary | ICD-10-CM

## 2024-07-23 DIAGNOSIS — E1122 Type 2 diabetes mellitus with diabetic chronic kidney disease: Secondary | ICD-10-CM

## 2024-07-23 DIAGNOSIS — R053 Chronic cough: Secondary | ICD-10-CM | POA: Diagnosis not present

## 2024-07-23 DIAGNOSIS — R5383 Other fatigue: Secondary | ICD-10-CM | POA: Diagnosis not present

## 2024-07-23 DIAGNOSIS — M25512 Pain in left shoulder: Secondary | ICD-10-CM

## 2024-07-23 DIAGNOSIS — K219 Gastro-esophageal reflux disease without esophagitis: Secondary | ICD-10-CM

## 2024-07-23 DIAGNOSIS — G8929 Other chronic pain: Secondary | ICD-10-CM | POA: Diagnosis not present

## 2024-07-23 LAB — TSH: TSH: 1.43 u[IU]/mL (ref 0.35–5.50)

## 2024-07-23 LAB — BASIC METABOLIC PANEL WITH GFR
BUN: 9 mg/dL (ref 6–23)
CO2: 29 meq/L (ref 19–32)
Calcium: 9.7 mg/dL (ref 8.4–10.5)
Chloride: 104 meq/L (ref 96–112)
Creatinine, Ser: 1.28 mg/dL (ref 0.40–1.50)
GFR: 53.52 mL/min — ABNORMAL LOW (ref >60.00)
Glucose, Bld: 95 mg/dL (ref 70–99)
Potassium: 5 meq/L (ref 3.5–5.1)
Sodium: 139 meq/L (ref 135–145)

## 2024-07-23 LAB — CBC
HCT: 44.4 % (ref 39.0–52.0)
Hemoglobin: 14.2 g/dL (ref 13.0–17.0)
MCHC: 32 g/dL (ref 30.0–36.0)
MCV: 88.4 fl (ref 78.0–100.0)
Platelets: 231 K/uL (ref 150.0–400.0)
RBC: 5.03 Mil/uL (ref 4.22–5.81)
RDW: 13.6 % (ref 11.5–15.5)
WBC: 3.1 K/uL — ABNORMAL LOW (ref 4.0–10.5)

## 2024-07-23 LAB — HEMOGLOBIN A1C: Hgb A1c MFr Bld: 6.6 % — ABNORMAL HIGH (ref 4.6–6.5)

## 2024-07-23 MED ORDER — OMEPRAZOLE 40 MG PO CPDR
40.0000 mg | DELAYED_RELEASE_CAPSULE | Freq: Every day | ORAL | 0 refills | Status: DC
  Filled 2024-07-23: qty 60, 60d supply, fill #0

## 2024-07-23 NOTE — Assessment & Plan Note (Signed)
 Could be a contributing factor for persistent cough, he is not having wheezing or dyspnea. Recommend resuming Advair  250-50 mcg twice daily. Albuterol  inh 2 puff every 6 hours as needed.SABRA

## 2024-07-23 NOTE — Assessment & Plan Note (Signed)
 This is a chronic problem, he has been referred to PT and is poor medicine, needed appointment was arranged. 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 Today he agrees with new referral to sports medicine.

## 2024-07-23 NOTE — Progress Notes (Signed)
 ACUTE VISIT Chief Complaint  Patient presents with   Cough    Ongoing    Arm Pain    Left arm x 2 weeks    Fatigue   Discussed the use of AI scribe software for clinical note transcription with the patient, who gave verbal consent to proceed.  History of Present Illness Willie Parks is a 78 year old male a PMHx significant for TIA, HTN, asthma, DM, CKD III, prostate cancer, HLD, and vitamin D  deficiency, who is here today with his wife complaining of a persistent cough.   He has a persistent cough that has not improved since his last visit, worsening at night without associated wheezing or difficulty breathing. Despite his history of asthma, he has not been using his Advair  inhaler.  Cough is worse at night and has been going on for several months,over a year.  He experiences occasional acid reflux with a burning sensation in lower mid chest when coughing, which he has not treated with over-the-counter medications.  A previous chest CT revealed a lung nodule.  Chest CT 02/18/2024 IMPRESSION: *Minimal bronchial dilatation without significant bronchiectatic changes are noted at the level of the proximal portion of both lower lobes. Findings could correlate perhaps with mild bronchitis. *2 mm nodule in the posterior aspect of the right upper lobe posterior segment. *Mild coronary artery calcification and calcification of the anterior wall of the ascending aorta without aneurysms. *2 mm right solid pulmonary nodule within the upper lobe. Lung-RADS 1, negative. Continue annual screening with low-dose chest CT without contrast in 12 months.  He experiences left shoulder pain, rated as 4 out of 10 in severity, located right under the shoulder and exacerbated by certain movements, limiting his range of motion.  An x-ray from February last year showed severe degenerative changes.  He was referred to sports medicine and physical therapy but did not arrange these appointments. He is not  currently taking any medication for the shoulder pain.  L Shoulder X ray - 02/03/2023 IMPRESSION: 1. 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 2. Mild degenerative changes at the acromioclavicular joint.  DM II: Dx'ed in 10/2021  He has not been checking his blood sugars recently. He is on non pharmacologic treatment.  Lab Results  Component Value Date   HGBA1C 6.6 (H) 02/04/2024   CKD III: His kidney function has been mildly abnormal but stable since 2022. Negative for gross hematuria, foam in urine,or decreased urine output. HTN on Amlodipine  5 mg daily.  Lab Results  Component Value Date   NA 139 02/04/2024   CL 103 02/04/2024   K 4.1 02/04/2024   CO2 27 02/04/2024   BUN 9 02/04/2024   CREATININE 1.26 02/04/2024   GFR 54.72 (L) 02/04/2024   CALCIUM  9.7 02/04/2024   ALBUMIN 4.5 02/04/2024   GLUCOSE 112 (H) 02/04/2024   He is under the care of a urologist for prostate cancer and according to wife, problem is stable and follow-up planned in two years.   No abdominal pain, changes in bowel habits, or blood in the stool are reported. He has not experienced any difficulty swallowing.  Fatigue: His wife is concerned about being fatigue most of the time. Overall he is able to sleep well with good sleep quality.  Follows up with urology every 6-12 months; pt reports no acute urinary concerns.   His wife reports he snores at night, but was told he did not require CPAP therapy after sleep study.  She also reports he  has always had the tendency to jerk in his sleep, but notes this is not new and he has done that his whole life.   Review of Systems  Constitutional:  Positive for appetite change and fatigue. Negative for activity change and fever.  HENT:  Negative for sore throat.   Cardiovascular:  Negative for palpitations and leg swelling.  Gastrointestinal:  Negative for nausea and vomiting.  Endocrine: Negative for cold intolerance and heat intolerance.   Genitourinary:  Negative for decreased urine volume, dysuria and hematuria.  Musculoskeletal:  Positive for arthralgias. Negative for gait problem.  Skin:  Negative for rash.  Allergic/Immunologic: Positive for environmental allergies.  Neurological:  Negative for syncope, facial asymmetry and weakness.  Psychiatric/Behavioral:  Negative for behavioral problems and confusion.   See other pertinent positives and negatives in HPI.  Current Outpatient Medications on File Prior to Visit  Medication Sig Dispense Refill   albuterol  (VENTOLIN  HFA) 108 (90 Base) MCG/ACT inhaler Inhale 2 puffs into the lungs every 6 (six) hours as needed for wheezing or shortness of breath. 6.7 g 2   amLODipine  (NORVASC ) 2.5 MG tablet Take 1 tablet (2.5 mg total) by mouth daily. 90 tablet 3   ascorbic acid  (VITAMIN C ) 500 MG tablet Take 1 tablet (500 mg total) by mouth daily.     aspirin  EC 81 MG tablet Take 81 mg by mouth daily. Swallow whole.     atorvastatin  (LIPITOR) 20 MG tablet Take 1 tablet (20 mg) by mouth every other day. 45 tablet 3   Calcium  Carb-Cholecalciferol (CALCIUM  + D3 PO) Take 1 tablet by mouth daily.     diclofenac  Sodium (VOLTAREN ) 1 % GEL Apply 4 g topically 4 (four) times daily. 150 g 1   fluticasone -salmeterol (ADVAIR  DISKUS) 250-50 MCG/ACT AEPB Inhale 1 puff into the lungs in the morning and at bedtime. 180 each 2   No current facility-administered medications on file prior to visit.   Past Medical History:  Diagnosis Date   Allergy    Asthma    Chicken pox    Hyperlipidemia    Prostate cancer (HCC)    Allergies  Allergen Reactions   Penicillins Other (See Comments)    Dizziness    Social History   Socioeconomic History   Marital status: Married    Spouse name: Not on file   Number of children: Not on file   Years of education: Not on file   Highest education level: Not on file  Occupational History   Occupation: retired  Tobacco Use   Smoking status: Never   Smokeless  tobacco: Never  Vaping Use   Vaping status: Never Used  Substance and Sexual Activity   Alcohol use: Not Currently    Alcohol/week: 1.0 standard drink of alcohol    Types: 1 Standard drinks or equivalent per week    Comment: per week   Drug use: No   Sexual activity: Not Currently  Other Topics Concern   Not on file  Social History Narrative   Second marriage. Resides with wife in [place]. Retired. Has one grown child. 15 grandchildren to include his step grandchildren.    10-08-18 Unable to ask abuse questions wife with him today.   Social Drivers of Corporate investment banker Strain: Low Risk  (09/04/2022)   Overall Financial Resource Strain (CARDIA)    Difficulty of Paying Living Expenses: Not hard at all  Food Insecurity: Low Risk  (03/04/2024)   Received from [facility]   Hunger Vital  Sign    Within the past 12 months, you worried that your food would run out before you got money to buy more: Never true    Within the past 12 months, the food you bought just didn't last and you didn't have money to get more. : Never true  Transportation Needs: No Transportation Needs (03/04/2024)   Received from [facility]    In the past 12 months, has lack of reliable transportation kept you from medical appointments, meetings, work or from getting things needed for daily living? : No  Physical Activity: Inactive (09/04/2022)   Exercise Vital Sign    Days of Exercise per Week: 0 days    Minutes of Exercise per Session: 0 min  Stress: No Stress Concern Present (09/04/2022)   [employer] of Occupational Health - Occupational Stress Questionnaire    Feeling of Stress : Not at all  Social Connections: Socially Integrated (09/04/2022)   Social Connection and Isolation Panel    Frequency of Communication with Friends and Family: More than three times a week    Frequency of Social Gatherings with Friends and Family: More than three times a week    Attends Religious  Services: More than 4 times per year    Active Member of [employer] or Organizations: Yes    Attends Banker Meetings: More than 4 times per year    Marital Status: Married   Vitals:   07/23/24 0725  BP: 120/70  Pulse: 73  Resp: 16  Temp: 98 F (36.7 C)  SpO2: 97%   Body mass index is 22.24 kg/m.  Physical Exam Vitals and nursing note reviewed.  Constitutional:      General: He is not in acute distress.    Appearance: He is well-developed.  HENT:     Head: Normocephalic and atraumatic.     Mouth/Throat:     Mouth: Mucous membranes are moist.     Dentition: Has dentures.     Pharynx: Oropharynx is clear.  Eyes:     Conjunctiva/sclera: Conjunctivae normal.  Cardiovascular:     Rate and Rhythm: Normal rate and regular rhythm.     Pulses:          Posterior tibial pulses are 2+ on the right side and 2+ on the left side.     Heart sounds: No murmur heard. Pulmonary:     Effort: Pulmonary effort is normal. No respiratory distress.     Breath sounds: Normal breath sounds.  Abdominal:     Palpations: Abdomen is soft. There is no mass.     Tenderness: There is no abdominal tenderness.  Musculoskeletal:     Left shoulder: Tenderness (with movement) present. Decreased range of motion.     Right lower leg: No edema.     Left lower leg: No edema.     Comments: Left shoulder: Vonzell' test neg, drop arm rotator cuff test neg.  Lymphadenopathy:     Cervical: No cervical adenopathy.  Skin:    General: Skin is warm.     Findings: No erythema or rash.  Neurological:     Mental Status: He is alert and oriented to person, place, and time.     Cranial Nerves: No cranial nerve deficit.     Gait: Gait normal.  Psychiatric:        Mood and Affect: Mood and affect normal.    ASSESSMENT AND PLAN: Mr. Lemmie Vanlanen was seen today for a persistent cough.  Lab  Results  Component Value Date   WBC 3.1 (L) 07/23/2024   HGB 14.2 07/23/2024   HCT 44.4 07/23/2024   MCV 88.4  07/23/2024   PLT 231.0 07/23/2024   Lab Results  Component Value Date   NA 139 07/23/2024   CL 104 07/23/2024   K 5.0 07/23/2024   CO2 29 07/23/2024   BUN 9 07/23/2024   CREATININE 1.28 07/23/2024   GFR 53.52 (L) 07/23/2024   CALCIUM  9.7 07/23/2024   ALBUMIN 4.5 02/04/2024   GLUCOSE 95 07/23/2024   Lab Results  Component Value Date   TSH 1.43 07/23/2024   Lab Results  Component Value Date   HGBA1C 6.6 (H) 07/23/2024   Chronic left shoulder pain Assessment & Plan: This is a chronic problem, he has been referred to PT and sport medicine, nether appointment was arranged. 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 Today he agrees with new referral to sports medicine.  Orders: -     Ambulatory referral to Sports Medicine  Stage 3a chronic kidney disease [facility]) Assessment & Plan: Stable, Cr 1.2 and e GFR 54 in 01/2024. Continue adequate hydration, low-salt diet, and avoidance of NSAIDs to prevent progression.  Orders: -     Basic metabolic panel with GFR; Future  Persistent cough Assessment & Plan: Problem has been going on for several months. Chest CT in 01/2024 showed a 2 mm right upper lobe pulmonary solid nodule.  We are planning on repeating CT in 01/2025. We discussed possible etiologies, he is having some GERD like symptoms and has not been using his Advair  as instructed. He agrees with trial of PPI. Instructed to resume Advair  twice daily. Monitor for new symptoms. Follow-up in 6 weeks.  Gastroesophageal reflux disease, unspecified whether esophagitis present Assessment & Plan: This problem could be contributing to his persistent cough. Recommend omeprazole  40 mg 30 minutes before breakfast, which to be decreased to 20 mg depending of clinical response. GERD precaution to continue. Follow-up in 6 weeks.  Orders: -     Omeprazole ; Take 1 capsule (40 mg total)  by mouth daily before breakfast.  Dispense: 60 capsule; Refill: 0  Type 2 diabetes mellitus with stage 3a chronic kidney disease, without long-term current use of insulin (HCC) Assessment & Plan: Problem has been adequately controlled with nonpharmacologic treatment. Last hemoglobin A1c 6.6 in 01/2024. Further recommendation will be given according to hemoglobin A1c results. Continue appropriate footcare and periodic eye examinations.  Orders: -     Hemoglobin A1c; Future  Fatigue, unspecified type Possible etiologies discussed. Some of his co morbilities can be contributing factors. Per wife report, he has been screened for sleep apnea and it was determined he did not need a CPAP. Monitor for new symptoms. Further recommendations according to lab results.  -     CBC; Future -     TSH; Future  Mild intermittent asthma, uncomplicated Assessment & Plan: Could be a contributing factor for persistent cough, he is not having wheezing or dyspnea. Recommend resuming Advair  250-50 mcg twice daily. Albuterol  inh 2 puff every 6 hours as needed..  I spent a total of 43 minutes in both face to face and non face to face activities for this visit on the date of this encounter. During this time history was obtained and documented, examination was performed, prior labs/imaging reviewed, and assessment/plan discussed.  Return in about 6 weeks (around 09/03/2024) for chronic problems.  I, Vernell Forest, acting as a scribe for Magaline Steinberg [place], MD., have documented all relevant documentation on the behalf of August Gosser [place], MD, as directed by   while in the presence of Tytianna Greenley [place], MD.  I, Katryn Plummer [place], MD, have reviewed all documentation for this visit. The documentation on 07/23/24 for the exam, diagnosis, procedures, and orders are all accurate and complete.  Franky Reier G. [place], MD  [facility]

## 2024-07-23 NOTE — Assessment & Plan Note (Signed)
 Problem has been adequately controlled with nonpharmacologic treatment. Last hemoglobin A1c 6.6 in 01/2024. Further recommendation will be given according to hemoglobin A1c results. Continue appropriate footcare and periodic eye examinations.

## 2024-07-23 NOTE — Assessment & Plan Note (Signed)
 Problem has been going on for a few months. Chest CT in 01/2024 showed a 2 mm right solid pulmonary nodule within the upper lobe.  We are planning on repeating CT in 01/2025. We discussed possible etiologies, he is having some GERD like symptoms and has not been using his Advair  as instructed. He agrees with trial of PPI. Instructed to resume Advair  twice daily. Monitor for new symptoms. Follow-up in 6 weeks.

## 2024-07-23 NOTE — Assessment & Plan Note (Signed)
 Stable, Cr 1.2 and e GFR 54 in 01/2024. Continue adequate hydration, low-salt diet, and avoidance of NSAIDs to prevent progression.

## 2024-07-23 NOTE — Patient Instructions (Addendum)
 A few things to remember from today's visit:  Persistent cough  Gastroesophageal reflux disease, unspecified whether esophagitis present - Plan: omeprazole  (PRILOSEC) 40 MG capsule  Stage 3a chronic kidney disease (HCC) - Plan: Basic metabolic panel with GFR  Type 2 diabetes mellitus with stage 3a chronic kidney disease, without long-term current use of insulin (HCC) - Plan: Hemoglobin A1c  Chronic left shoulder pain - Plan: Ambulatory referral to Sports Medicine  Fatigue, unspecified type - Plan: CBC, TSH  Omeprazole  30 min before breakfast. Start Advair  daily 2 times. Sport medicine office will call you. I see you in 6 weeks.  If you need refills for medications you take chronically, please call your pharmacy. Do not use My Chart to request refills or for acute issues that need immediate attention. If you send a my chart message, it may take a few days to be addressed, specially if I am not in the office.  Please be sure medication list is accurate. If a new problem present, please set up appointment sooner than planned today.

## 2024-07-23 NOTE — Assessment & Plan Note (Signed)
 This problem could be contributing to his persistent cough. Recommend omeprazole  40 mg 30 minutes before breakfast, which to be decreased to 20 mg depending of clinical response. GERD precaution to continue. Follow-up in 6 weeks.

## 2024-08-11 ENCOUNTER — Ambulatory Visit: Payer: Medicare HMO | Admitting: Family Medicine

## 2024-08-19 ENCOUNTER — Other Ambulatory Visit (HOSPITAL_COMMUNITY): Payer: Self-pay

## 2024-09-01 ENCOUNTER — Other Ambulatory Visit (HOSPITAL_COMMUNITY): Payer: Self-pay

## 2024-09-01 ENCOUNTER — Other Ambulatory Visit: Payer: Self-pay | Admitting: Family Medicine

## 2024-09-01 DIAGNOSIS — J4520 Mild intermittent asthma, uncomplicated: Secondary | ICD-10-CM

## 2024-09-01 MED ORDER — FLUTICASONE-SALMETEROL 250-50 MCG/ACT IN AEPB
1.0000 | INHALATION_SPRAY | Freq: Two times a day (BID) | RESPIRATORY_TRACT | 2 refills | Status: AC
  Filled 2024-09-01: qty 60, 30d supply, fill #0
  Filled 2024-09-01: qty 180, 90d supply, fill #0
  Filled 2024-11-05: qty 60, 30d supply, fill #1

## 2024-09-03 ENCOUNTER — Ambulatory Visit: Admitting: Family Medicine

## 2024-09-06 ENCOUNTER — Other Ambulatory Visit (HOSPITAL_COMMUNITY): Payer: Self-pay

## 2024-09-06 ENCOUNTER — Ambulatory Visit (INDEPENDENT_AMBULATORY_CARE_PROVIDER_SITE_OTHER): Admitting: Family Medicine

## 2024-09-06 VITALS — BP 136/76 | HR 74 | Temp 98.4°F | Resp 16 | Ht 74.0 in | Wt 175.6 lb

## 2024-09-06 DIAGNOSIS — I10 Essential (primary) hypertension: Secondary | ICD-10-CM

## 2024-09-06 DIAGNOSIS — R053 Chronic cough: Secondary | ICD-10-CM

## 2024-09-06 DIAGNOSIS — M545 Low back pain, unspecified: Secondary | ICD-10-CM

## 2024-09-06 DIAGNOSIS — J4520 Mild intermittent asthma, uncomplicated: Secondary | ICD-10-CM

## 2024-09-06 DIAGNOSIS — K219 Gastro-esophageal reflux disease without esophagitis: Secondary | ICD-10-CM | POA: Diagnosis not present

## 2024-09-06 DIAGNOSIS — R7989 Other specified abnormal findings of blood chemistry: Secondary | ICD-10-CM

## 2024-09-06 LAB — CBC WITH DIFFERENTIAL/PLATELET
Basophils Absolute: 0 K/uL (ref 0.0–0.1)
Basophils Relative: 0.9 % (ref 0.0–3.0)
Eosinophils Absolute: 0.2 K/uL (ref 0.0–0.7)
Eosinophils Relative: 5.1 % — ABNORMAL HIGH (ref 0.0–5.0)
HCT: 42.3 % (ref 39.0–52.0)
Hemoglobin: 13.7 g/dL (ref 13.0–17.0)
Lymphocytes Relative: 31.9 % (ref 12.0–46.0)
Lymphs Abs: 1 K/uL (ref 0.7–4.0)
MCHC: 32.5 g/dL (ref 30.0–36.0)
MCV: 87.2 fl (ref 78.0–100.0)
Monocytes Absolute: 0.4 K/uL (ref 0.1–1.0)
Monocytes Relative: 13.4 % — ABNORMAL HIGH (ref 3.0–12.0)
Neutro Abs: 1.5 K/uL (ref 1.4–7.7)
Neutrophils Relative %: 48.7 % (ref 43.0–77.0)
Platelets: 236 K/uL (ref 150.0–400.0)
RBC: 4.85 Mil/uL (ref 4.22–5.81)
RDW: 13.7 % (ref 11.5–15.5)
WBC: 3.2 K/uL — ABNORMAL LOW (ref 4.0–10.5)

## 2024-09-06 MED ORDER — OMEPRAZOLE 20 MG PO CPDR
20.0000 mg | DELAYED_RELEASE_CAPSULE | Freq: Every day | ORAL | 0 refills | Status: DC
  Filled 2024-09-06: qty 90, 90d supply, fill #0

## 2024-09-06 NOTE — Patient Instructions (Addendum)
 A few things to remember from today's visit:  Mild intermittent asthma, uncomplicated  Acute midline low back pain without sciatica - Plan: DG Lumbar Spine Complete, CBC with Differential/Platelet  Abnormal CBC - Plan: CBC with Differential/Platelet  Essential hypertension  When finish Omeprazole  40 mg continue 20 mg daily before breakfast for 3 weeks then every other day for 3 weeks and stop. If cough gets worse again or any heartburn, resume medication. Back X ray Friday.  Monitor blood pressure at home.  If you need refills for medications you take chronically, please call your pharmacy. Do not use My Chart to request refills or for acute issues that need immediate attention. If you send a my chart message, it may take a few days to be addressed, specially if I am not in the office.  Please be sure medication list is accurate. If a new problem present, please set up appointment sooner than planned today.
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
## 2024-09-09 ENCOUNTER — Ambulatory Visit: Payer: Self-pay | Admitting: Family Medicine

## 2024-09-09 ENCOUNTER — Encounter: Payer: Self-pay | Admitting: Family Medicine

## 2024-09-09 ENCOUNTER — Other Ambulatory Visit: Payer: Self-pay

## 2024-09-09 ENCOUNTER — Other Ambulatory Visit (HOSPITAL_COMMUNITY): Payer: Self-pay

## 2024-09-09 NOTE — Assessment & Plan Note (Signed)
 Not having heartburn. Complete Omeprazole  40 mg daily and then decrease to 20 mg and wean it off as instructed on AVS. If cough returns or heartburn presents, go back to prior daily done. Continue GERD precautions.

## 2024-09-09 NOTE — Assessment & Plan Note (Signed)
 BP mildly elevated, improved after rechecking it. Recommend monitoring BP at home. Continue amlodipine  2.5 mg daily.

## 2024-09-09 NOTE — Assessment & Plan Note (Signed)
 Cough has greatly improved. Continue Advair   250-50 mcg once daily as he has been using it so far.

## 2024-09-10 ENCOUNTER — Ambulatory Visit

## 2024-09-10 ENCOUNTER — Other Ambulatory Visit

## 2024-09-10 DIAGNOSIS — M545 Low back pain, unspecified: Secondary | ICD-10-CM | POA: Diagnosis not present

## 2024-09-10 DIAGNOSIS — M47816 Spondylosis without myelopathy or radiculopathy, lumbar region: Secondary | ICD-10-CM | POA: Diagnosis not present

## 2024-10-09 DIAGNOSIS — H02051 Trichiasis without entropian right upper eyelid: Secondary | ICD-10-CM | POA: Diagnosis not present

## 2024-10-25 ENCOUNTER — Encounter: Payer: Self-pay | Admitting: Family Medicine

## 2024-10-28 ENCOUNTER — Other Ambulatory Visit (HOSPITAL_COMMUNITY): Payer: Self-pay

## 2024-11-05 ENCOUNTER — Other Ambulatory Visit (HOSPITAL_COMMUNITY): Payer: Self-pay

## 2024-11-06 ENCOUNTER — Other Ambulatory Visit (HOSPITAL_COMMUNITY): Payer: Self-pay

## 2024-12-04 ENCOUNTER — Other Ambulatory Visit: Payer: Self-pay | Admitting: Family Medicine

## 2024-12-04 DIAGNOSIS — K219 Gastro-esophageal reflux disease without esophagitis: Secondary | ICD-10-CM

## 2024-12-06 ENCOUNTER — Other Ambulatory Visit (HOSPITAL_COMMUNITY): Payer: Self-pay

## 2024-12-06 ENCOUNTER — Other Ambulatory Visit: Payer: Self-pay

## 2024-12-06 MED ORDER — OMEPRAZOLE 20 MG PO CPDR
20.0000 mg | DELAYED_RELEASE_CAPSULE | Freq: Every day | ORAL | 1 refills | Status: AC
  Filled 2024-12-06: qty 90, 90d supply, fill #0

## 2024-12-07 ENCOUNTER — Other Ambulatory Visit (HOSPITAL_COMMUNITY): Payer: Self-pay

## 2025-03-11 ENCOUNTER — Ambulatory Visit: Admitting: Family Medicine
